# Patient Record
Sex: Female | Born: 1945 | Race: White | Hispanic: No | Marital: Married | Smoking: Former smoker
Health system: Southern US, Community
[De-identification: ages and names within clinical notes are randomized; demographics above are authoritative.]

## PROBLEM LIST (undated history)

## (undated) DIAGNOSIS — M810 Age-related osteoporosis without current pathological fracture: Secondary | ICD-10-CM

## (undated) DIAGNOSIS — K219 Gastro-esophageal reflux disease without esophagitis: Secondary | ICD-10-CM

## (undated) DIAGNOSIS — E785 Hyperlipidemia, unspecified: Secondary | ICD-10-CM

## (undated) DIAGNOSIS — T7840XA Allergy, unspecified, initial encounter: Secondary | ICD-10-CM

## (undated) DIAGNOSIS — M1990 Unspecified osteoarthritis, unspecified site: Secondary | ICD-10-CM

## (undated) DIAGNOSIS — M549 Dorsalgia, unspecified: Secondary | ICD-10-CM

## (undated) DIAGNOSIS — E039 Hypothyroidism, unspecified: Secondary | ICD-10-CM

## (undated) DIAGNOSIS — Z9089 Acquired absence of other organs: Secondary | ICD-10-CM

## (undated) DIAGNOSIS — J329 Chronic sinusitis, unspecified: Secondary | ICD-10-CM

## (undated) HISTORY — PX: ADENOIDECTOMY: SUR15

## (undated) HISTORY — DX: Dorsalgia, unspecified: M54.9

## (undated) HISTORY — PX: TONSILLECTOMY: SUR1361

## (undated) HISTORY — PX: DILATION AND CURETTAGE OF UTERUS: SHX78

## (undated) HISTORY — PX: PARATHYROIDECTOMY: SHX19

## (undated) HISTORY — DX: Allergy, unspecified, initial encounter: T78.40XA

## (undated) HISTORY — PX: LEFT OOPHORECTOMY: SHX1961

## (undated) HISTORY — PX: SHOULDER SURGERY: SHX246

## (undated) HISTORY — DX: Age-related osteoporosis without current pathological fracture: M81.0

## (undated) HISTORY — PX: POLYPECTOMY: SHX149

## (undated) HISTORY — DX: Chronic sinusitis, unspecified: J32.9

## (undated) HISTORY — DX: Gastro-esophageal reflux disease without esophagitis: K21.9

## (undated) HISTORY — DX: Acquired absence of other organs: Z90.89

## (undated) HISTORY — DX: Hypothyroidism, unspecified: E03.9

## (undated) HISTORY — DX: Hyperlipidemia, unspecified: E78.5

## (undated) HISTORY — DX: Unspecified osteoarthritis, unspecified site: M19.90

---

## 1998-10-29 ENCOUNTER — Other Ambulatory Visit: Admission: RE | Admit: 1998-10-29 | Discharge: 1998-10-29 | Payer: Self-pay | Admitting: Gastroenterology

## 1998-12-04 ENCOUNTER — Encounter: Payer: Self-pay | Admitting: Internal Medicine

## 1998-12-04 ENCOUNTER — Inpatient Hospital Stay (HOSPITAL_COMMUNITY): Admission: EM | Admit: 1998-12-04 | Discharge: 1998-12-05 | Payer: Self-pay | Admitting: Internal Medicine

## 1998-12-15 ENCOUNTER — Other Ambulatory Visit: Admission: RE | Admit: 1998-12-15 | Discharge: 1998-12-15 | Payer: Self-pay | Admitting: Gynecology

## 1999-01-10 ENCOUNTER — Encounter: Payer: Self-pay | Admitting: Internal Medicine

## 1999-01-10 ENCOUNTER — Ambulatory Visit (HOSPITAL_COMMUNITY): Admission: RE | Admit: 1999-01-10 | Discharge: 1999-01-10 | Payer: Self-pay | Admitting: Internal Medicine

## 1999-04-23 ENCOUNTER — Emergency Department (HOSPITAL_COMMUNITY): Admission: EM | Admit: 1999-04-23 | Discharge: 1999-04-24 | Payer: Self-pay | Admitting: Emergency Medicine

## 1999-04-24 ENCOUNTER — Encounter: Payer: Self-pay | Admitting: Emergency Medicine

## 1999-05-18 ENCOUNTER — Encounter: Payer: Self-pay | Admitting: Urology

## 1999-05-18 ENCOUNTER — Encounter: Admission: RE | Admit: 1999-05-18 | Discharge: 1999-05-18 | Payer: Self-pay | Admitting: Urology

## 1999-10-23 ENCOUNTER — Other Ambulatory Visit: Admission: RE | Admit: 1999-10-23 | Discharge: 1999-10-23 | Payer: Self-pay | Admitting: Gastroenterology

## 1999-12-17 ENCOUNTER — Other Ambulatory Visit: Admission: RE | Admit: 1999-12-17 | Discharge: 1999-12-17 | Payer: Self-pay | Admitting: Gynecology

## 2000-05-03 HISTORY — PX: ANTERIOR CERVICAL DECOMP/DISCECTOMY FUSION: SHX1161

## 2000-12-19 ENCOUNTER — Other Ambulatory Visit: Admission: RE | Admit: 2000-12-19 | Discharge: 2000-12-19 | Payer: Self-pay | Admitting: Gynecology

## 2001-01-09 ENCOUNTER — Ambulatory Visit (HOSPITAL_COMMUNITY): Admission: RE | Admit: 2001-01-09 | Discharge: 2001-01-09 | Payer: Self-pay | Admitting: Neurosurgery

## 2001-01-30 ENCOUNTER — Encounter: Payer: Self-pay | Admitting: Neurosurgery

## 2001-01-30 ENCOUNTER — Ambulatory Visit (HOSPITAL_COMMUNITY): Admission: RE | Admit: 2001-01-30 | Discharge: 2001-01-31 | Payer: Self-pay | Admitting: Neurosurgery

## 2001-03-21 ENCOUNTER — Encounter: Payer: Self-pay | Admitting: Otolaryngology

## 2001-03-21 ENCOUNTER — Encounter: Admission: RE | Admit: 2001-03-21 | Discharge: 2001-03-21 | Payer: Self-pay | Admitting: Otolaryngology

## 2001-03-27 ENCOUNTER — Ambulatory Visit (HOSPITAL_COMMUNITY): Admission: RE | Admit: 2001-03-27 | Discharge: 2001-03-27 | Payer: Self-pay | Admitting: Neurosurgery

## 2001-03-27 ENCOUNTER — Encounter: Payer: Self-pay | Admitting: Neurosurgery

## 2001-12-28 ENCOUNTER — Other Ambulatory Visit: Admission: RE | Admit: 2001-12-28 | Discharge: 2001-12-28 | Payer: Self-pay | Admitting: Obstetrics and Gynecology

## 2003-01-28 ENCOUNTER — Other Ambulatory Visit: Admission: RE | Admit: 2003-01-28 | Discharge: 2003-01-28 | Payer: Self-pay | Admitting: Gynecology

## 2004-03-25 ENCOUNTER — Ambulatory Visit: Payer: Self-pay | Admitting: Internal Medicine

## 2004-03-31 ENCOUNTER — Ambulatory Visit: Payer: Self-pay | Admitting: Internal Medicine

## 2012-04-25 ENCOUNTER — Emergency Department: Payer: Self-pay | Admitting: Emergency Medicine

## 2012-04-25 LAB — COMPREHENSIVE METABOLIC PANEL
Albumin: 4.3 g/dL (ref 3.4–5.0)
Alkaline Phosphatase: 80 U/L (ref 50–136)
Anion Gap: 6 — ABNORMAL LOW (ref 7–16)
BUN: 11 mg/dL (ref 7–18)
Bilirubin,Total: 1.1 mg/dL — ABNORMAL HIGH (ref 0.2–1.0)
Calcium, Total: 10.3 mg/dL — ABNORMAL HIGH (ref 8.5–10.1)
Chloride: 111 mmol/L — ABNORMAL HIGH (ref 98–107)
Co2: 26 mmol/L (ref 21–32)
Creatinine: 0.89 mg/dL (ref 0.60–1.30)
EGFR (African American): >60
EGFR (Non-African Amer.): >60
Glucose: 112 mg/dL — ABNORMAL HIGH (ref 65–99)
Osmolality: 285 (ref 275–301)
Potassium: 3.9 mmol/L (ref 3.5–5.1)
SGOT(AST): 25 U/L (ref 15–37)
SGPT (ALT): 30 U/L (ref 12–78)
Sodium: 143 mmol/L (ref 136–145)
Total Protein: 7.8 g/dL (ref 6.4–8.2)

## 2012-04-25 LAB — URINALYSIS, COMPLETE
Bacteria: NONE SEEN
Bilirubin,UR: NEGATIVE
Glucose,UR: NEGATIVE mg/dL (ref 0–75)
Ketone: NEGATIVE
Leukocyte Esterase: NEGATIVE
Nitrite: NEGATIVE
Ph: 6 (ref 4.5–8.0)
Protein: NEGATIVE
RBC,UR: 21 /HPF (ref 0–5)
Specific Gravity: 1.02 (ref 1.003–1.030)
Squamous Epithelial: 1
WBC UR: <1 /HPF (ref 0–5)

## 2012-04-25 LAB — CBC
HCT: 45.8 % (ref 35.0–47.0)
HGB: 15.5 g/dL (ref 12.0–16.0)
MCH: 31.2 pg (ref 26.0–34.0)
MCHC: 33.9 g/dL (ref 32.0–36.0)
MCV: 92 fL (ref 80–100)
Platelet: 248 10*3/uL (ref 150–440)
RBC: 4.97 10*6/uL (ref 3.80–5.20)
RDW: 13.3 % (ref 11.5–14.5)
WBC: 10 10*3/uL (ref 3.6–11.0)

## 2012-04-25 LAB — LIPASE, BLOOD: Lipase: 100 U/L (ref 73–393)

## 2014-07-31 LAB — HM DEXA SCAN

## 2015-05-07 DIAGNOSIS — H6980 Other specified disorders of Eustachian tube, unspecified ear: Secondary | ICD-10-CM | POA: Diagnosis not present

## 2015-05-07 DIAGNOSIS — J302 Other seasonal allergic rhinitis: Secondary | ICD-10-CM | POA: Diagnosis not present

## 2015-05-07 DIAGNOSIS — H9313 Tinnitus, bilateral: Secondary | ICD-10-CM | POA: Diagnosis not present

## 2015-05-12 DIAGNOSIS — I10 Essential (primary) hypertension: Secondary | ICD-10-CM | POA: Diagnosis not present

## 2015-05-12 DIAGNOSIS — E559 Vitamin D deficiency, unspecified: Secondary | ICD-10-CM | POA: Diagnosis not present

## 2015-05-12 DIAGNOSIS — E890 Postprocedural hypothyroidism: Secondary | ICD-10-CM | POA: Diagnosis not present

## 2015-05-12 DIAGNOSIS — E210 Primary hyperparathyroidism: Secondary | ICD-10-CM | POA: Diagnosis not present

## 2015-06-04 DIAGNOSIS — H9313 Tinnitus, bilateral: Secondary | ICD-10-CM | POA: Diagnosis not present

## 2015-06-04 DIAGNOSIS — H6980 Other specified disorders of Eustachian tube, unspecified ear: Secondary | ICD-10-CM | POA: Diagnosis not present

## 2015-06-04 DIAGNOSIS — J302 Other seasonal allergic rhinitis: Secondary | ICD-10-CM | POA: Diagnosis not present

## 2015-07-09 DIAGNOSIS — E039 Hypothyroidism, unspecified: Secondary | ICD-10-CM | POA: Diagnosis not present

## 2015-07-09 DIAGNOSIS — I959 Hypotension, unspecified: Secondary | ICD-10-CM | POA: Diagnosis not present

## 2015-07-09 DIAGNOSIS — E785 Hyperlipidemia, unspecified: Secondary | ICD-10-CM | POA: Diagnosis not present

## 2015-07-16 DIAGNOSIS — E039 Hypothyroidism, unspecified: Secondary | ICD-10-CM | POA: Diagnosis not present

## 2015-08-04 DIAGNOSIS — J342 Deviated nasal septum: Secondary | ICD-10-CM | POA: Diagnosis not present

## 2015-08-04 DIAGNOSIS — H6983 Other specified disorders of Eustachian tube, bilateral: Secondary | ICD-10-CM | POA: Diagnosis not present

## 2015-08-04 DIAGNOSIS — J3489 Other specified disorders of nose and nasal sinuses: Secondary | ICD-10-CM | POA: Diagnosis not present

## 2015-09-15 DIAGNOSIS — H6983 Other specified disorders of Eustachian tube, bilateral: Secondary | ICD-10-CM | POA: Diagnosis not present

## 2015-10-03 DIAGNOSIS — H6983 Other specified disorders of Eustachian tube, bilateral: Secondary | ICD-10-CM | POA: Diagnosis not present

## 2015-11-10 DIAGNOSIS — E559 Vitamin D deficiency, unspecified: Secondary | ICD-10-CM | POA: Diagnosis not present

## 2015-11-10 DIAGNOSIS — E892 Postprocedural hypoparathyroidism: Secondary | ICD-10-CM | POA: Diagnosis not present

## 2015-12-15 DIAGNOSIS — H6980 Other specified disorders of Eustachian tube, unspecified ear: Secondary | ICD-10-CM | POA: Diagnosis not present

## 2015-12-15 DIAGNOSIS — H9313 Tinnitus, bilateral: Secondary | ICD-10-CM | POA: Diagnosis not present

## 2015-12-15 DIAGNOSIS — J302 Other seasonal allergic rhinitis: Secondary | ICD-10-CM | POA: Diagnosis not present

## 2016-01-14 DIAGNOSIS — E039 Hypothyroidism, unspecified: Secondary | ICD-10-CM | POA: Diagnosis not present

## 2016-01-21 DIAGNOSIS — E039 Hypothyroidism, unspecified: Secondary | ICD-10-CM | POA: Diagnosis not present

## 2016-01-21 DIAGNOSIS — E782 Mixed hyperlipidemia: Secondary | ICD-10-CM | POA: Diagnosis not present

## 2016-01-21 DIAGNOSIS — Z23 Encounter for immunization: Secondary | ICD-10-CM | POA: Diagnosis not present

## 2016-01-21 DIAGNOSIS — E785 Hyperlipidemia, unspecified: Secondary | ICD-10-CM | POA: Diagnosis not present

## 2016-01-21 DIAGNOSIS — R252 Cramp and spasm: Secondary | ICD-10-CM | POA: Diagnosis not present

## 2016-03-31 DIAGNOSIS — R3129 Other microscopic hematuria: Secondary | ICD-10-CM | POA: Diagnosis not present

## 2016-03-31 DIAGNOSIS — N8110 Cystocele, unspecified: Secondary | ICD-10-CM | POA: Diagnosis not present

## 2016-03-31 DIAGNOSIS — Z01419 Encounter for gynecological examination (general) (routine) without abnormal findings: Secondary | ICD-10-CM | POA: Diagnosis not present

## 2016-03-31 DIAGNOSIS — N951 Menopausal and female climacteric states: Secondary | ICD-10-CM | POA: Diagnosis not present

## 2016-03-31 DIAGNOSIS — Z124 Encounter for screening for malignant neoplasm of cervix: Secondary | ICD-10-CM | POA: Diagnosis not present

## 2016-04-13 DIAGNOSIS — Z1231 Encounter for screening mammogram for malignant neoplasm of breast: Secondary | ICD-10-CM | POA: Diagnosis not present

## 2016-07-14 DIAGNOSIS — E039 Hypothyroidism, unspecified: Secondary | ICD-10-CM | POA: Diagnosis not present

## 2016-07-14 DIAGNOSIS — E559 Vitamin D deficiency, unspecified: Secondary | ICD-10-CM | POA: Diagnosis not present

## 2016-07-14 DIAGNOSIS — E782 Mixed hyperlipidemia: Secondary | ICD-10-CM | POA: Diagnosis not present

## 2016-07-14 LAB — LIPID PANEL
CHOLESTEROL: 151 (ref 0–200)
Cholesterol: 151 (ref 0–200)
HDL: 51 (ref 35–70)
HDL: 51 (ref 35–70)
LDL CALC: 74
LDL Cholesterol: 74
LDL/HDL RATIO: 1.5
TRIGLYCERIDES: 130 (ref 40–160)
Triglycerides: 130 (ref 40–160)

## 2016-07-14 LAB — HEPATIC FUNCTION PANEL
ALT: 28 (ref 7–35)
ALT: 28 (ref 7–35)
AST: 20 (ref 13–35)
AST: 20 (ref 13–35)
Alkaline Phosphatase: 55 (ref 25–125)

## 2016-07-14 LAB — BASIC METABOLIC PANEL
Creatinine: 1 (ref <=1.1)
GLUCOSE: 101
POTASSIUM: 4.2 (ref 3.4–5.3)
POTASSIUM: 4.2 (ref 3.4–5.3)

## 2016-07-14 LAB — CBC AND DIFFERENTIAL
HCT: 43 (ref 36–46)
HEMOGLOBIN: 14.8 (ref 12.0–16.0)
Platelets: 253 (ref 150–399)
WBC: 5.4

## 2016-07-14 LAB — VITAMIN D 25 HYDROXY (VIT D DEFICIENCY, FRACTURES): Vit D, 25-Hydroxy: 34.7

## 2016-07-14 LAB — TSH: TSH: 1.52 (ref <=5.90)

## 2016-07-21 DIAGNOSIS — J019 Acute sinusitis, unspecified: Secondary | ICD-10-CM | POA: Diagnosis not present

## 2016-07-21 DIAGNOSIS — E039 Hypothyroidism, unspecified: Secondary | ICD-10-CM | POA: Diagnosis not present

## 2016-07-21 DIAGNOSIS — E782 Mixed hyperlipidemia: Secondary | ICD-10-CM | POA: Diagnosis not present

## 2016-08-18 DIAGNOSIS — H9209 Otalgia, unspecified ear: Secondary | ICD-10-CM | POA: Diagnosis not present

## 2016-08-18 DIAGNOSIS — J019 Acute sinusitis, unspecified: Secondary | ICD-10-CM | POA: Diagnosis not present

## 2016-08-18 DIAGNOSIS — R51 Headache: Secondary | ICD-10-CM | POA: Diagnosis not present

## 2016-09-23 DIAGNOSIS — J029 Acute pharyngitis, unspecified: Secondary | ICD-10-CM | POA: Diagnosis not present

## 2016-12-15 ENCOUNTER — Encounter: Payer: Self-pay | Admitting: Family Medicine

## 2016-12-15 ENCOUNTER — Ambulatory Visit (INDEPENDENT_AMBULATORY_CARE_PROVIDER_SITE_OTHER): Payer: Medicare Other | Admitting: Family Medicine

## 2016-12-15 VITALS — BP 128/78 | HR 61 | Temp 98.1°F | Ht 66.0 in | Wt 184.8 lb

## 2016-12-15 DIAGNOSIS — E663 Overweight: Secondary | ICD-10-CM | POA: Diagnosis not present

## 2016-12-15 DIAGNOSIS — Z0000 Encounter for general adult medical examination without abnormal findings: Secondary | ICD-10-CM | POA: Diagnosis not present

## 2016-12-15 DIAGNOSIS — E039 Hypothyroidism, unspecified: Secondary | ICD-10-CM | POA: Diagnosis not present

## 2016-12-15 DIAGNOSIS — E782 Mixed hyperlipidemia: Secondary | ICD-10-CM

## 2016-12-15 DIAGNOSIS — E785 Hyperlipidemia, unspecified: Secondary | ICD-10-CM

## 2016-12-15 HISTORY — DX: Hypothyroidism, unspecified: E03.9

## 2016-12-15 HISTORY — DX: Hyperlipidemia, unspecified: E78.5

## 2016-12-15 LAB — LIPID PANEL
Cholesterol: 168 mg/dL (ref 0–200)
HDL: 49 mg/dL (ref >39.00)
LDL Cholesterol: 95 mg/dL (ref 0–99)
NonHDL: 119.29
Total CHOL/HDL Ratio: 3
Triglycerides: 121 mg/dL (ref 0.0–149.0)
VLDL: 24.2 mg/dL (ref 0.0–40.0)

## 2016-12-15 LAB — COMPREHENSIVE METABOLIC PANEL
ALT: 26 U/L (ref 0–35)
AST: 24 U/L (ref 0–37)
Albumin: 4.7 g/dL (ref 3.5–5.2)
Alkaline Phosphatase: 44 U/L (ref 39–117)
BUN: 9 mg/dL (ref 6–23)
CO2: 29 mEq/L (ref 19–32)
Calcium: 10.2 mg/dL (ref 8.4–10.5)
Chloride: 108 mEq/L (ref 96–112)
Creatinine, Ser: 1.02 mg/dL (ref 0.40–1.20)
GFR: 56.85 mL/min — ABNORMAL LOW (ref >60.00)
Glucose, Bld: 96 mg/dL (ref 70–99)
Potassium: 4.6 mEq/L (ref 3.5–5.1)
Sodium: 143 mEq/L (ref 135–145)
Total Bilirubin: 1.2 mg/dL (ref 0.2–1.2)
Total Protein: 6.6 g/dL (ref 6.0–8.3)

## 2016-12-15 LAB — CBC
HCT: 44.8 % (ref 36.0–46.0)
Hemoglobin: 15.1 g/dL — ABNORMAL HIGH (ref 12.0–15.0)
MCHC: 33.7 g/dL (ref 30.0–36.0)
MCV: 93 fl (ref 78.0–100.0)
Platelets: 273 10*3/uL (ref 150.0–400.0)
RBC: 4.82 Mil/uL (ref 3.87–5.11)
RDW: 13.9 % (ref 11.5–15.5)
WBC: 5.3 10*3/uL (ref 4.0–10.5)

## 2016-12-15 LAB — TSH: TSH: 1.09 u[IU]/mL (ref 0.35–4.50)

## 2016-12-15 LAB — T4, FREE: Free T4: 1.23 ng/dL (ref 0.60–1.60)

## 2016-12-15 NOTE — Progress Notes (Signed)
Latasha Mclaughlin is a 71 y.o. female is here to [place].   Patient Care Team: Latasha Deutscher, DO as PCP - General (Family Medicine)   History of Present Illness:   Latasha Mclaughlin, CMA, acting as scribe for Dr. Juleen Mclaughlin.  HPI:  Health Maintenance Due  Topic Date Due  . Hepatitis C Screening  11/06/1945  . TETANUS/TDAP  05/21/1965  . PNA vac Low Risk Adult (2 of 2 - PPSV23) 05/18/2016  . INFLUENZA VACCINE  12/01/2016   Depression screen PHQ 2/9 12/15/2016  Decreased Interest 0  Down, Depressed, Hopeless 0  PHQ - 2 Score 0   PMHx, SurgHx, SocialHx, Medications, and Allergies were reviewed in the Visit Navigator and updated as appropriate.   Past Medical History:  Diagnosis Date  . History of tonsillectomy   . Hyperlipidemia 12/15/2016  . Hypothyroidism 12/15/2016  . Osteoporosis    Past Surgical History:  Procedure Laterality Date  . ANTERIOR CERVICAL DECOMP/DISCECTOMY FUSION  2002  . LEFT OOPHORECTOMY    . PARATHYROIDECTOMY    . TONSILLECTOMY     Family History  Problem Relation Age of Onset  . Hyperlipidemia Mother   . Hypertension Mother   . Cancer Father   . Diabetes Father   . Heart disease Father   . Hyperlipidemia Father   . Thyroid disease Sister    Social History  Substance Use Topics  . Smoking status: Former Research scientist (life sciences)  . Smokeless tobacco: Never Used     Comment: Quite in 1985  . Alcohol use No   Current Medications and Allergies:   .  Cholecalciferol (VITAMIN D-1000 MAX ST) 1000 units tablet, Take 2,000 Units by mouth daily., Disp: , Rfl:  .  levothyroxine (SYNTHROID) 100 MCG tablet, Take 100 mcg by mouth every 3 (three) days., Disp: , Rfl:  .  levothyroxine (SYNTHROID) 88 MCG tablet, Take 88 mcg by mouth., Disp: , Rfl:  .  rosuvastatin (CRESTOR) 10 MG tablet, Take 10 mg by mouth every other day., Disp: , Rfl: (NOT CURRENTLY TAKING)  Allergies  Allergen Reactions  . Prevacid [Lansoprazole] Rash   Review of Systems:   Pertinent items are  noted in the HPI. Otherwise, ROS is negative.  Vitals:   Vitals:   12/15/16 1051  BP: 128/78  Pulse: 61  Temp: 98.1 F (36.7 C)  TempSrc: Oral  SpO2: 97%  Weight: 184 lb 12.8 oz (83.8 kg)  Height: 5\' 6"  (1.676 m)     Body mass index is 29.83 kg/m. Physical Exam:   Physical Exam  Constitutional: She appears well-nourished.  HENT:  Head: Normocephalic and atraumatic.  Eyes: Pupils are equal, round, and reactive to light. EOM are normal.  Neck: Normal range of motion. Neck supple.  Cardiovascular: Normal rate, regular rhythm, normal heart sounds and intact distal pulses.   Pulmonary/Chest: Effort normal.  Abdominal: Soft.  Skin: Skin is warm.  Psychiatric: She has a normal mood and affect. Her behavior is normal.  Nursing note and vitals reviewed.  Results for orders placed or performed in visit on 12/15/16  TSH  Result Value Ref Range   TSH 1.09 0.35 - 4.50 uIU/mL  T4, free  Result Value Ref Range   Free T4 1.23 0.60 - 1.60 ng/dL  CBC  Result Value Ref Range   WBC 5.3 4.0 - 10.5 K/uL   RBC 4.82 3.87 - 5.11 Mil/uL   Platelets 273.0 150.0 - 400.0 K/uL   Hemoglobin 15.1 (H) 12.0 - 15.0 g/dL   HCT  44.8 36.0 - 46.0 %   MCV 93.0 78.0 - 100.0 fl   MCHC 33.7 30.0 - 36.0 g/dL   RDW 13.9 11.5 - 15.5 %  Comprehensive metabolic panel  Result Value Ref Range   Sodium 143 135 - 145 mEq/L   Potassium 4.6 3.5 - 5.1 mEq/L   Chloride 108 96 - 112 mEq/L   CO2 29 19 - 32 mEq/L   Glucose, Bld 96 70 - 99 mg/dL   BUN 9 6 - 23 mg/dL   Creatinine, Ser 1.02 0.40 - 1.20 mg/dL   Total Bilirubin 1.2 0.2 - 1.2 mg/dL   Alkaline Phosphatase 44 39 - 117 U/L   AST 24 0 - 37 U/L   ALT 26 0 - 35 U/L   Total Protein 6.6 6.0 - 8.3 g/dL   Albumin 4.7 3.5 - 5.2 g/dL   Calcium 10.2 8.4 - 10.5 mg/dL   GFR 56.85 (L) >60.00 mL/min  Lipid panel  Result Value Ref Range   Cholesterol 168 0 - 200 mg/dL   Triglycerides 121.0 0.0 - 149.0 mg/dL   HDL 49.00 >39.00 mg/dL   VLDL 24.2 0.0 - 40.0 mg/dL     LDL Cholesterol 95 0 - 99 mg/dL   Total CHOL/HDL Ratio 3    NonHDL 119.29   VITAMIN D 25 Hydroxy (Vit-D Deficiency, Fractures)  Result Value Ref Range   Vit D, 25-Hydroxy 59.5   Basic metabolic panel  Result Value Ref Range   Glucose 101    Creatinine 1.0 0.5 - 1.1   Potassium 4.2 3.4 - 5.3  Lipid panel  Result Value Ref Range   LDl/HDL Ratio 1.5    Triglycerides 130 40 - 160   Cholesterol 151 0 - 200   HDL 51 35 - 70   LDL Cholesterol 74   Hepatic function panel  Result Value Ref Range   ALT 28 7 - 35   AST 20 13 - 35  TSH  Result Value Ref Range   TSH 1.52 0.41 - 5.90   Assessment and Plan:   Latasha Mclaughlin was seen today for establish care.  Diagnoses and all orders for this visit:  Routine health maintenance -     CBC -     Comprehensive metabolic panel  Acquired hypothyroidism Comments: Will allow patient to try 88 mcg daily x 6 weeks and recheck since she would like to simplify her regimen.  Orders: -     TSH -     T4, free  Mixed hyperlipidemia Comments: Controlled at this time.  Orders: -     Lipid panel  Overweight (BMI 25.0-29.9) Comments: The patient is asked to make an attempt to improve diet and exercise patterns to aid in medical management of this problem.    . Reviewed expectations re: course of current medical issues. . Discussed self-management of symptoms. . Outlined signs and symptoms indicating need for more acute intervention. . Patient verbalized understanding and all questions were answered. Marland Kitchen Health Maintenance issues including appropriate healthy diet, exercise, and smoking avoidance were discussed with patient. . See orders for this visit as documented in the electronic medical record. . Patient received an After Visit Summary.  CMA served as Education administrator during this visit. History, Physical, and Plan performed by medical provider. The above documentation has been reviewed and is accurate and complete. Latasha Mclaughlin, D.O.  Latasha Deutscher,  DO [place], [facility] 12/16/2016  Future Appointments Date Time Provider [place]  06/17/2017 11:00 AM Latasha Deutscher,  DO [facility] None

## 2016-12-16 ENCOUNTER — Encounter: Payer: Self-pay | Admitting: Family Medicine

## 2016-12-16 DIAGNOSIS — E663 Overweight: Secondary | ICD-10-CM | POA: Insufficient documentation

## 2016-12-21 ENCOUNTER — Telehealth: Payer: Self-pay | Admitting: Family Medicine

## 2016-12-21 NOTE — Telephone Encounter (Signed)
ROI faxed to [facility]  °

## 2016-12-24 NOTE — Telephone Encounter (Signed)
Rec'd records from [facility]

## 2016-12-26 MED ORDER — PRAVASTATIN SODIUM 20 MG PO TABS: 20.0000 mg | ORAL_TABLET | Freq: Every day | ORAL | 3 refills | Status: DC

## 2016-12-26 NOTE — Addendum Note (Signed)
Addended by: Briscoe Deutscher R on: 12/26/2016 07:37 PM   Modules accepted: Orders

## 2017-01-05 ENCOUNTER — Ambulatory Visit (INDEPENDENT_AMBULATORY_CARE_PROVIDER_SITE_OTHER): Payer: Medicare Other | Admitting: Family Medicine

## 2017-01-05 ENCOUNTER — Telehealth: Payer: Self-pay | Admitting: Family Medicine

## 2017-01-05 ENCOUNTER — Encounter: Payer: Self-pay | Admitting: Family Medicine

## 2017-01-05 DIAGNOSIS — M791 Myalgia, unspecified site: Secondary | ICD-10-CM | POA: Insufficient documentation

## 2017-01-05 DIAGNOSIS — R202 Paresthesia of skin: Secondary | ICD-10-CM | POA: Diagnosis not present

## 2017-01-05 DIAGNOSIS — Z8639 Personal history of other endocrine, nutritional and metabolic disease: Secondary | ICD-10-CM | POA: Diagnosis not present

## 2017-01-05 DIAGNOSIS — R5383 Other fatigue: Secondary | ICD-10-CM | POA: Diagnosis not present

## 2017-01-05 DIAGNOSIS — M2550 Pain in unspecified joint: Secondary | ICD-10-CM | POA: Diagnosis not present

## 2017-01-05 LAB — COMPREHENSIVE METABOLIC PANEL
AG Ratio: 1.8 (calc) (ref 1.0–2.5)
ALT: 18 U/L (ref 6–29)
AST: 17 U/L (ref 10–35)
Albumin: 4.4 g/dL (ref 3.6–5.1)
Alkaline phosphatase (APISO): 51 U/L (ref 33–130)
BUN/Creatinine Ratio: 11 (calc) (ref 6–22)
BUN: 11 mg/dL (ref 7–25)
CO2: 26 mmol/L (ref 20–32)
Calcium: 10.4 mg/dL (ref 8.6–10.4)
Chloride: 106 mmol/L (ref 98–110)
Creat: 0.96 mg/dL — ABNORMAL HIGH (ref 0.60–0.93)
Globulin: 2.5 g/dL (calc) (ref 1.9–3.7)
Glucose, Bld: 95 mg/dL (ref 65–99)
Potassium: 4.7 mmol/L (ref 3.5–5.3)
Sodium: 143 mmol/L (ref 135–146)
Total Bilirubin: 0.8 mg/dL (ref 0.2–1.2)
Total Protein: 6.9 g/dL (ref 6.1–8.1)

## 2017-01-05 LAB — CK: Total CK: 56 U/L (ref 29–143)

## 2017-01-05 NOTE — Telephone Encounter (Signed)
Patient called in with symptoms of nervousness/jittery, painful bones, and hands and feet "tingling". Patient did not know if this was a reaction to medication pravastatin (PRAVACHOL) 20 MG tablet. Sent patient to triage. Awaiting note from triage.

## 2017-01-05 NOTE — Telephone Encounter (Signed)
Patient coming in for appointment with [name] this afternoon.

## 2017-01-05 NOTE — Telephone Encounter (Signed)
Patient Name: Latasha Mclaughlin  DOB: 05-01-1946    Initial Comment Caller states, on a new rx. tingling in muscles, hands and feet, bone pain, para-thyroid issues. Please ask for name of rx.    Nurse Assessment  Nurse: [name], RN, Vanita Ingles Date/Time (Eastern Time): 01/05/2017 11:54:05 AM  Confirm and document reason for call. If symptomatic, describe symptoms. ---Caller states she is having pain in her bones. Tingling in her hands and feet. Symptoms started a week ago. Symptoms have worsened the past 3-4 days. History of parathyroidism. Taking pravastatin. BP and pulse are fine. Feels jittery. bone pain in the bottom of her back, knees and elbows.  Does the patient have any new or worsening symptoms? ---Yes  Will a triage be completed? ---Yes  Related visit to physician within the last 2 weeks? ---No  Does the PT have any chronic conditions? (i.e. diabetes, asthma, etc.) ---Yes  List chronic conditions. ---history of parathyroidism  Is this a behavioral health or substance abuse call? ---No     Guidelines    Guideline Title Affirmed Question Affirmed Notes  Neurologic Deficit Back pain (and neurologic deficit)    Final Disposition User   See Physician within 4 Hours (or PCP triage) [name], RN, [place]    Comments  appt scheduled for 01/05/2017 at 2:30 pm with Dr. Briscoe Deutscher   Referrals  REFERRED TO PCP OFFICE   Disagree/Comply: Comply

## 2017-01-05 NOTE — Progress Notes (Signed)
Latasha Mclaughlin is a 71 y.o. female here for an acute visit.  History of Present Illness:   Water quality scientist, CMA, acting as scribe for Dr. [name].  HPI: Patient with history of HPTH presents with several days of myalgias and arthralgias. Latasha Mclaughlin also changed her Levothyroxine dose at the last visit (see previous note) and started Pravachol (see previous note). Since the pain started, Latasha Mclaughlin went back to her previous dose of Levothyroxine and stopped the Pravachol. No headaches, dizziness, vision changes, CP, SOB, URI s/s, cough, abdominal pain, bowel or bladder changes, falls, edema, or other new exposures. Latasha Mclaughlin says that this feels the same as when Latasha Mclaughlin had a HPTH issue and needed intervention.  PMHx, SurgHx, SocialHx, Medications, and Allergies were reviewed in the Visit Navigator and updated as appropriate.  Current Medications:   .  Cholecalciferol (VITAMIN D-1000 MAX ST) 1000 units tablet, Take 2,000 Units by mouth daily., Disp: , Rfl:  .  levothyroxine (SYNTHROID) 100 MCG tablet, Take 100 mcg by mouth every 3 (three) days., Disp: , Rfl:  .  levothyroxine (SYNTHROID) 88 MCG tablet, Take 88 mcg by mouth., Disp: , Rfl:  .  pravastatin (PRAVACHOL) 20 MG tablet, Take 1 tablet (20 mg total) by mouth daily., Disp: 90 tablet, Rfl: 3   Allergies  Allergen Reactions  . Prevacid [Lansoprazole] Rash   Review of Systems:   Pertinent items are noted in the HPI. Otherwise, ROS is negative.  Vitals:   Vitals:   01/05/17 1432  BP: 126/78  Pulse: 76  Temp: 98 F (36.7 C)  TempSrc: Oral  SpO2: 97%  Weight: 183 lb 12.8 oz (83.4 kg)  Height: 5\' 6"  (1.676 m)     Body mass index is 29.67 kg/m.   Physical Exam:   Physical Exam  Constitutional: Latasha Mclaughlin appears well-nourished.  HENT:  Head: Normocephalic and atraumatic.  Eyes: Pupils are equal, round, and reactive to light. Conjunctivae and EOM are normal.  Neck: Normal range of motion. Neck supple. No thyromegaly present.  Cardiovascular: Normal  rate, regular rhythm, normal heart sounds and intact distal pulses.   Pulmonary/Chest: Effort normal and breath sounds normal.  Abdominal: Soft. Bowel sounds are normal.  Musculoskeletal: Normal range of motion.  Neurological: Latasha Mclaughlin is alert.  Skin: Skin is warm. Capillary refill takes less than 2 seconds.  Psychiatric: Latasha Mclaughlin has a normal mood and affect. Her behavior is normal.  Nursing note and vitals reviewed.   Assessment and Plan:   Latasha Mclaughlin was seen today for acute visit.  Diagnoses and all orders for this visit:  Myalgia -     CBC with Differential/Platelet -     Cancel: Comprehensive metabolic panel -     Cancel: PTH, Intact and Calcium -     TSH -     Vitamin B12 -     Cancel: CK -     Sedimentation rate -     C-reactive protein -     PTH, Intact and Calcium -     CK -     Comprehensive metabolic panel  Arthralgia, unspecified joint -     CBC with Differential/Platelet -     Cancel: Comprehensive metabolic panel -     Cancel: PTH, Intact and Calcium -     TSH -     Vitamin B12 -     Cancel: CK -     Sedimentation rate -     C-reactive protein  Fatigue, unspecified type -     Vitamin  B12 -     PTH, Intact and Calcium -     CK -     Comprehensive metabolic panel  Paresthesia -     Vitamin B12 -     PTH, Intact and Calcium -     CK -     Comprehensive metabolic panel   . Reviewed expectations re: course of current medical issues. . Discussed self-management of symptoms. . Outlined signs and symptoms indicating need for more acute intervention. . Patient verbalized understanding and all questions were answered. Marland Kitchen Health Maintenance issues including appropriate healthy diet, exercise, and smoking avoidance were discussed with patient. . See orders for this visit as documented in the electronic medical record. . Patient received an After Visit Summary.  CMA served as Education administrator during this visit. History, Physical, and Plan performed by medical provider. The above  documentation has been reviewed and is accurate and complete. Briscoe Deutscher, D.O.  Briscoe Deutscher, DO [place], [facility] 01/08/2017  Future Appointments Date Time Provider [place]  06/17/2017 11:00 AM Briscoe Deutscher, DO [facility] None

## 2017-01-05 NOTE — Telephone Encounter (Signed)
Noted  

## 2017-01-06 LAB — TSH: TSH: 1.47 u[IU]/mL (ref 0.35–4.50)

## 2017-01-06 LAB — CBC WITH DIFFERENTIAL/PLATELET
Basophils Absolute: 0.1 10*3/uL (ref 0.0–0.1)
Basophils Relative: 1.3 % (ref 0.0–3.0)
Eosinophils Absolute: 0.1 10*3/uL (ref 0.0–0.7)
Eosinophils Relative: 1.1 % (ref 0.0–5.0)
HCT: 46 % (ref 36.0–46.0)
Hemoglobin: 15.3 g/dL — ABNORMAL HIGH (ref 12.0–15.0)
Lymphocytes Relative: 34.6 % (ref 12.0–46.0)
Lymphs Abs: 2.1 10*3/uL (ref 0.7–4.0)
MCHC: 33.2 g/dL (ref 30.0–36.0)
MCV: 93.5 fl (ref 78.0–100.0)
Monocytes Absolute: 0.5 10*3/uL (ref 0.1–1.0)
Monocytes Relative: 8.6 % (ref 3.0–12.0)
Neutro Abs: 3.3 10*3/uL (ref 1.4–7.7)
Neutrophils Relative %: 54.4 % (ref 43.0–77.0)
Platelets: 286 10*3/uL (ref 150.0–400.0)
RBC: 4.93 Mil/uL (ref 3.87–5.11)
RDW: 13.9 % (ref 11.5–15.5)
WBC: 6.1 10*3/uL (ref 4.0–10.5)

## 2017-01-06 LAB — VITAMIN B12: Vitamin B-12: 274 pg/mL (ref 211–911)

## 2017-01-06 LAB — SEDIMENTATION RATE: Sed Rate: 5 mm/hr (ref 0–30)

## 2017-01-06 LAB — PTH, INTACT AND CALCIUM
Calcium: 10.4 mg/dL (ref 8.6–10.4)
PTH: 44 pg/mL (ref 14–64)

## 2017-01-07 LAB — C-REACTIVE PROTEIN: CRP: <0.1 mg/dL — ABNORMAL LOW (ref 0.5–20.0)

## 2017-01-08 DIAGNOSIS — Z8639 Personal history of other endocrine, nutritional and metabolic disease: Secondary | ICD-10-CM | POA: Insufficient documentation

## 2017-01-17 ENCOUNTER — Encounter: Payer: Self-pay | Admitting: Family Medicine

## 2017-01-17 ENCOUNTER — Ambulatory Visit (INDEPENDENT_AMBULATORY_CARE_PROVIDER_SITE_OTHER): Payer: Medicare Other

## 2017-01-17 ENCOUNTER — Ambulatory Visit (INDEPENDENT_AMBULATORY_CARE_PROVIDER_SITE_OTHER): Payer: Medicare Other | Admitting: Family Medicine

## 2017-01-17 ENCOUNTER — Telehealth: Payer: Self-pay | Admitting: Family Medicine

## 2017-01-17 VITALS — BP 142/82 | HR 72 | Temp 98.2°F | Wt 185.4 lb

## 2017-01-17 DIAGNOSIS — R1032 Left lower quadrant pain: Secondary | ICD-10-CM | POA: Diagnosis not present

## 2017-01-17 LAB — COMPREHENSIVE METABOLIC PANEL
ALT: 20 U/L (ref 0–35)
AST: 18 U/L (ref 0–37)
Albumin: 4.4 g/dL (ref 3.5–5.2)
Alkaline Phosphatase: 49 U/L (ref 39–117)
BILIRUBIN TOTAL: 0.9 mg/dL (ref 0.2–1.2)
BUN: 9 mg/dL (ref 6–23)
CO2: 26 meq/L (ref 19–32)
Calcium: 10.1 mg/dL (ref 8.4–10.5)
Chloride: 106 mEq/L (ref 96–112)
Creatinine, Ser: 1.04 mg/dL (ref 0.40–1.20)
GFR: 55.57 mL/min — AB (ref >60.00)
GLUCOSE: 93 mg/dL (ref 70–99)
Potassium: 4.4 mEq/L (ref 3.5–5.1)
SODIUM: 140 meq/L (ref 135–145)
TOTAL PROTEIN: 6.8 g/dL (ref 6.0–8.3)

## 2017-01-17 LAB — POCT URINALYSIS DIPSTICK
BILIRUBIN UA: NEGATIVE
Glucose, UA: NEGATIVE
KETONES UA: NEGATIVE
Leukocytes, UA: NEGATIVE
Nitrite, UA: NEGATIVE
PH UA: 6.5 (ref 5.0–8.0)
PROTEIN UA: NEGATIVE
RBC UA: NEGATIVE
Urobilinogen, UA: 0.2 E.U./dL

## 2017-01-17 LAB — CBC WITH DIFFERENTIAL/PLATELET
BASOS ABS: 0.1 10*3/uL (ref 0.0–0.1)
Basophils Relative: 0.9 % (ref 0.0–3.0)
EOS ABS: 0.1 10*3/uL (ref 0.0–0.7)
Eosinophils Relative: 1 % (ref 0.0–5.0)
HCT: 45.4 % (ref 36.0–46.0)
Hemoglobin: 15.1 g/dL — ABNORMAL HIGH (ref 12.0–15.0)
LYMPHS ABS: 2.3 10*3/uL (ref 0.7–4.0)
Lymphocytes Relative: 37.2 % (ref 12.0–46.0)
MCHC: 33.2 g/dL (ref 30.0–36.0)
MCV: 93.2 fl (ref 78.0–100.0)
MONO ABS: 0.6 10*3/uL (ref 0.1–1.0)
Monocytes Relative: 9 % (ref 3.0–12.0)
NEUTROS ABS: 3.2 10*3/uL (ref 1.4–7.7)
NEUTROS PCT: 51.9 % (ref 43.0–77.0)
PLATELETS: 284 10*3/uL (ref 150.0–400.0)
RBC: 4.86 Mil/uL (ref 3.87–5.11)
RDW: 14 % (ref 11.5–15.5)
WBC: 6.2 10*3/uL (ref 4.0–10.5)

## 2017-01-17 LAB — URINALYSIS, MICROSCOPIC ONLY
RBC / HPF: NONE SEEN (ref 0–?)
WBC, UA: NONE SEEN (ref 0–?)

## 2017-01-17 IMAGING — DX DG ABDOMEN 1V
1 series · 1 of 1 positions shown · non-contrast
Comparison: None.

CLINICAL DATA: Acute left lower quadrant abdominal pain.

EXAM:
ABDOMEN - 1 VIEW

[kub ap]
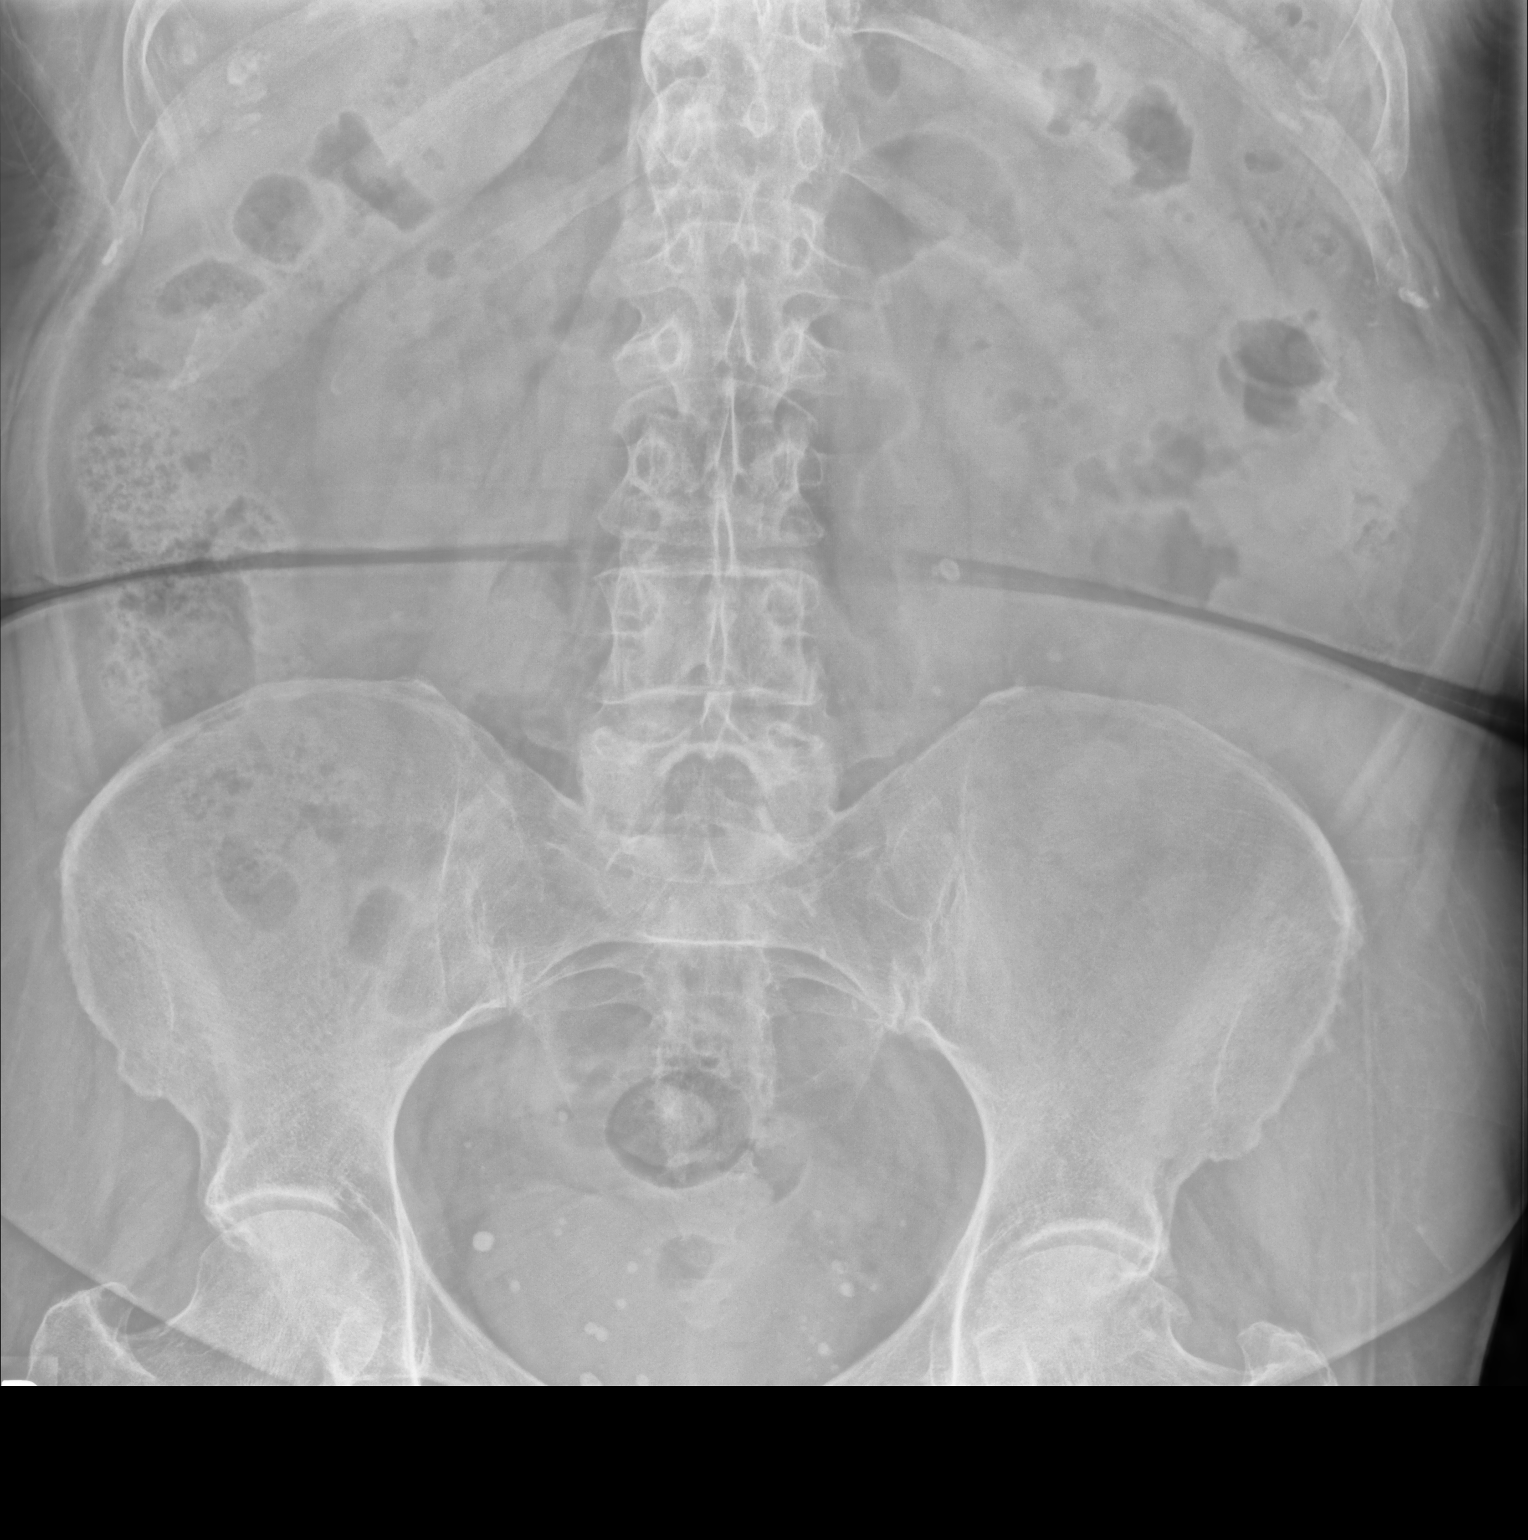

[1 of 1 positions shown; findings below may reference images not displayed]

FINDINGS: The bowel gas pattern is normal. Phleboliths are noted in the
pelvis.
IMPRESSION: No evidence of bowel obstruction or ileus.

## 2017-01-17 NOTE — Progress Notes (Signed)
Subjective:    Patient ID: Latasha Mclaughlin, female    DOB: Nov 18, 1945, 71 y.o.   MRN: [id number]  HPI This is a 71 yo female with left lower abdominal pain x 4 days. Pain started left side of abdomen with first morning void, then feels sore. Pain has been worse today- pain in side with urination. Does not think she is having dysuria. Pressure relieved with bowel movement, last BM today. No diarrhea, no constipation. No fever. No nausea or vomiting. Pain worse in morning with first void. Feels sore after urinating. Had some hydrocodone for a couple of days, none in last couple of days. Has been drinking cranberry juice. Urine not dark. Does not feel poorly. Sleeping well. Last colonoscopy 3 years ago, had a polyp removed. Was told to follow up in 3 years, missed spring follow up due to moving.  Has a history of kidney stones x 1, 15 years ago. Pain similar, not exactly same. Prior episode, pain was constant. This comes and goes.   Past Medical History:  Diagnosis Date  . Arthritis   . Back pain   . GERD (gastroesophageal reflux disease)   . History of tonsillectomy   . Hyperlipidemia 12/15/2016  . Hypothyroidism 12/15/2016  . Osteoporosis    Past Surgical History:  Procedure Laterality Date  . ADENOIDECTOMY    . ANTERIOR CERVICAL DECOMP/DISCECTOMY FUSION  2002  . DILATION AND CURETTAGE OF UTERUS     X2  . LEFT OOPHORECTOMY    . PARATHYROIDECTOMY    . SHOULDER SURGERY    . TONSILLECTOMY    . VAGINAL HYSTERECTOMY     Family History  Problem Relation Age of Onset  . Hyperlipidemia Mother   . Hypertension Mother   . Congestive Heart Failure Mother   . Cancer Father   . Diabetes Father   . Heart disease Father   . Hyperlipidemia Father   . Thyroid disease Sister   . Drug abuse Brother   . Diabetes Son    Social History  Substance Use Topics  . Smoking status: Former Research scientist (life sciences)  . Smokeless tobacco: Never Used     Comment: Quite in 1985  . Alcohol use No      Review of  Systems Per HPI    Objective:   Physical Exam  Constitutional: She is oriented to person, place, and time. She appears well-developed and well-nourished. No distress.  HENT:  Head: Normocephalic and atraumatic.  Eyes: Conjunctivae are normal.  Cardiovascular: Normal rate, regular rhythm and normal heart sounds.   Pulmonary/Chest: Effort normal and breath sounds normal.  Abdominal: Soft. Bowel sounds are normal. She exhibits no distension and no mass. There is tenderness. There is no rebound and no guarding.  Neurological: She is alert and oriented to person, place, and time.  Skin: Skin is warm and dry. She is not diaphoretic.  Psychiatric: She has a normal mood and affect. Her behavior is normal. Judgment and thought content normal.  Vitals reviewed.    BP (!) 142/82 (BP Location: Right Arm, Patient Position: Sitting, Cuff Size: Normal)   Pulse 72   Temp 98.2 F (36.8 C) (Oral)   Wt 185 lb 6.4 oz (84.1 kg)   SpO2 96%   BMI 29.92 kg/m  Wt Readings from Last 3 Encounters:  01/17/17 185 lb 6.4 oz (84.1 kg)  01/05/17 183 lb 12.8 oz (83.4 kg)  12/15/16 184 lb 12.8 oz (83.8 kg)        Assessment & Plan:  Discussed with Dr. [name] who agreed with plan 1. Left lower quadrant pain - unsure of cause- ? Stone vs diverticulitis, will check labs and xray, non toxic appearance - will notify her of results and plan when labs/xray available - POCT urinalysis dipstick - POCT urinalysis dipstick - DG Abd 1 View; Future - CBC with Differential/Platelet - Comprehensive metabolic panel - Urine Microscopic   Clarene Reamer, FNP-BC  [facility] [place] [place] [facility]  01/17/2017 1:37 PM

## 2017-01-17 NOTE — Telephone Encounter (Signed)
Patient is being seen at 1pm today with Latasha Mclaughlin for pains in lower back, difficulty urinating

## 2017-01-17 NOTE — Patient Instructions (Signed)
Please continue to drink cranberry juice- 1-2 glasses a day For pain, can try tylenol or ibuprofen  I will notify you of labs/xray results

## 2017-01-17 NOTE — Telephone Encounter (Signed)
Noted  

## 2017-01-19 ENCOUNTER — Ambulatory Visit (HOSPITAL_COMMUNITY)
Admission: RE | Admit: 2017-01-19 | Discharge: 2017-01-19 | Disposition: A | Discharge status: Home / Self Care | Payer: Medicare Other | Source: Ambulatory Visit | Attending: Family Medicine | Admitting: Family Medicine

## 2017-01-19 ENCOUNTER — Telehealth: Payer: Self-pay | Admitting: Family Medicine

## 2017-01-19 ENCOUNTER — Ambulatory Visit (INDEPENDENT_AMBULATORY_CARE_PROVIDER_SITE_OTHER): Payer: Medicare Other | Admitting: Family Medicine

## 2017-01-19 ENCOUNTER — Other Ambulatory Visit: Payer: Self-pay

## 2017-01-19 ENCOUNTER — Encounter: Payer: Self-pay | Admitting: Family Medicine

## 2017-01-19 VITALS — BP 122/80 | HR 66 | Temp 98.5°F | Ht 66.0 in | Wt 183.2 lb

## 2017-01-19 DIAGNOSIS — Z23 Encounter for immunization: Secondary | ICD-10-CM | POA: Diagnosis not present

## 2017-01-19 DIAGNOSIS — R1032 Left lower quadrant pain: Secondary | ICD-10-CM | POA: Diagnosis not present

## 2017-01-19 DIAGNOSIS — K760 Fatty (change of) liver, not elsewhere classified: Secondary | ICD-10-CM | POA: Diagnosis not present

## 2017-01-19 DIAGNOSIS — Z90710 Acquired absence of both cervix and uterus: Secondary | ICD-10-CM | POA: Diagnosis not present

## 2017-01-19 DIAGNOSIS — I700 Atherosclerosis of aorta: Secondary | ICD-10-CM | POA: Diagnosis not present

## 2017-01-19 DIAGNOSIS — Z90721 Acquired absence of ovaries, unilateral: Secondary | ICD-10-CM | POA: Insufficient documentation

## 2017-01-19 DIAGNOSIS — R300 Dysuria: Secondary | ICD-10-CM

## 2017-01-19 DIAGNOSIS — K449 Diaphragmatic hernia without obstruction or gangrene: Secondary | ICD-10-CM | POA: Diagnosis not present

## 2017-01-19 DIAGNOSIS — K7689 Other specified diseases of liver: Secondary | ICD-10-CM | POA: Diagnosis not present

## 2017-01-19 LAB — POCT URINALYSIS DIPSTICK
Bilirubin, UA: NEGATIVE
Blood, UA: NEGATIVE
Glucose, UA: NEGATIVE
Ketones, UA: NEGATIVE
Leukocytes, UA: NEGATIVE
Nitrite, UA: NEGATIVE
Protein, UA: NEGATIVE
Spec Grav, UA: 1.01 (ref 1.010–1.025)
Urobilinogen, UA: 0.2 E.U./dL
pH, UA: 6 (ref 5.0–8.0)

## 2017-01-19 IMAGING — CT CT ABD-PELV W/O CM
2 of 4 series · 15 of 46 positions shown, 17 images · non-contrast
Comparison: [DATE] abdominal radiograph.

CLINICAL DATA: Left lower quadrant abdominal and left flank pain.
Nephrolithiasis suspected. Prior hysterectomy and left oophorectomy.

EXAM:
CT ABDOMEN AND PELVIS WITHOUT CONTRAST
TECHNIQUE: Multidetector CT imaging of the abdomen and pelvis was performed
following the standard protocol without IV contrast.

[Series 2: axial st · axial · 0.76mm/px · z∈[-506,-76]mm · 12 of 96 slices shown, 14 images]
[im 5/96  soft-tissue]
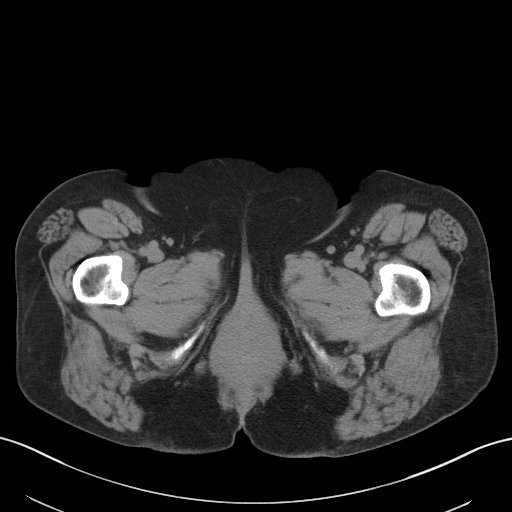
[im 5/96  bone]
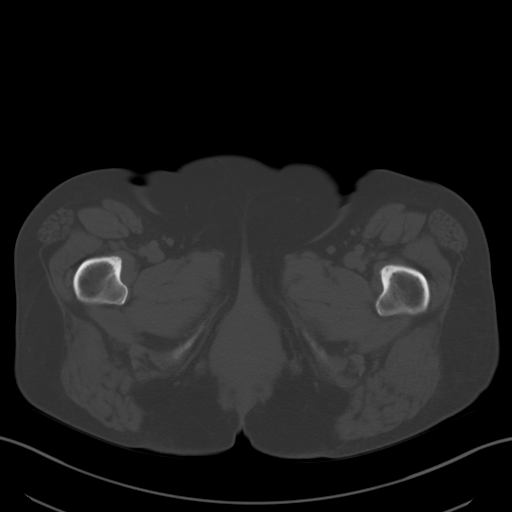
[im 13/96  soft-tissue]
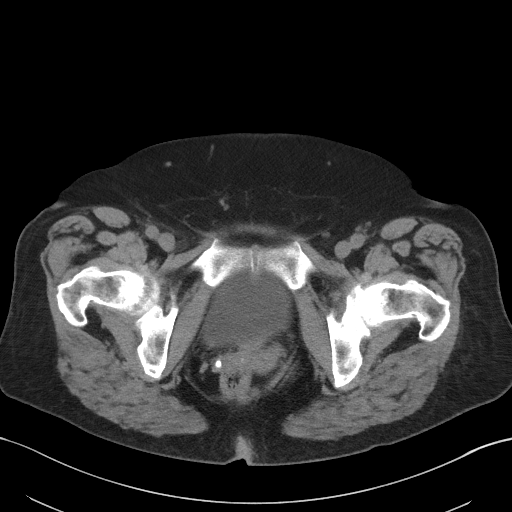
[im 21/96  soft-tissue]
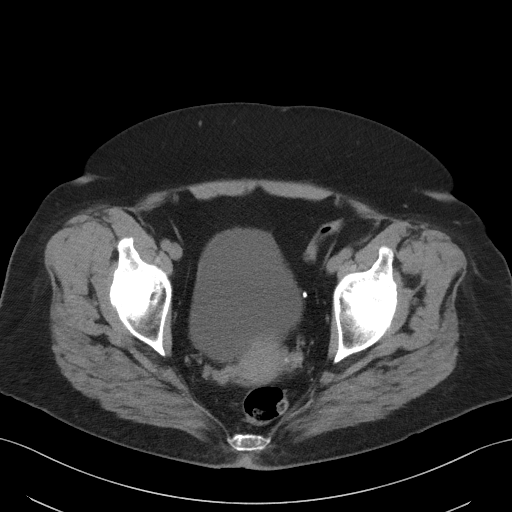
[im 29/96  soft-tissue]
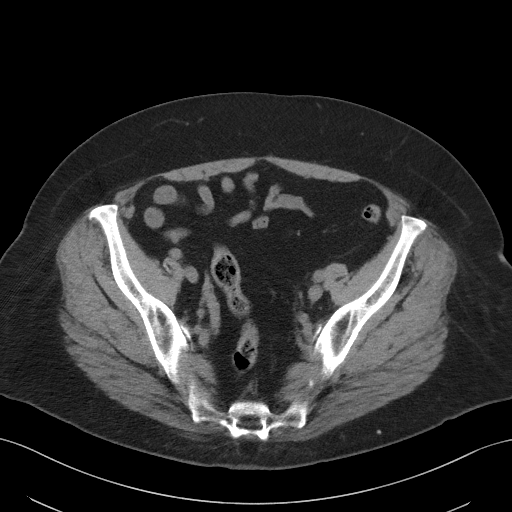
[im 38/96  soft-tissue]
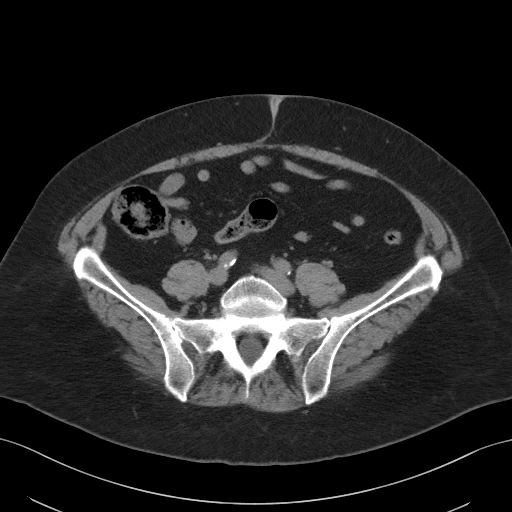
[im 46/96  soft-tissue]
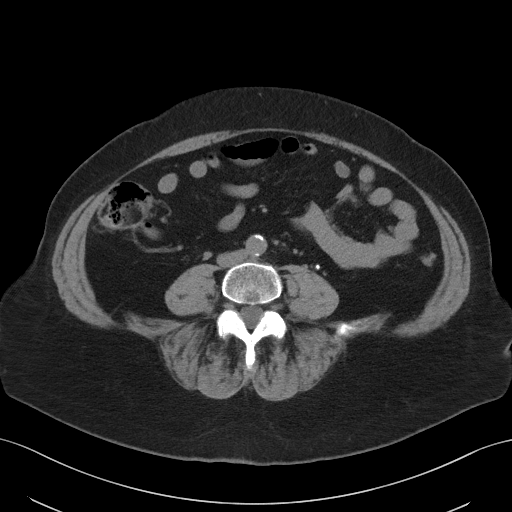
[im 50/96  soft-tissue]
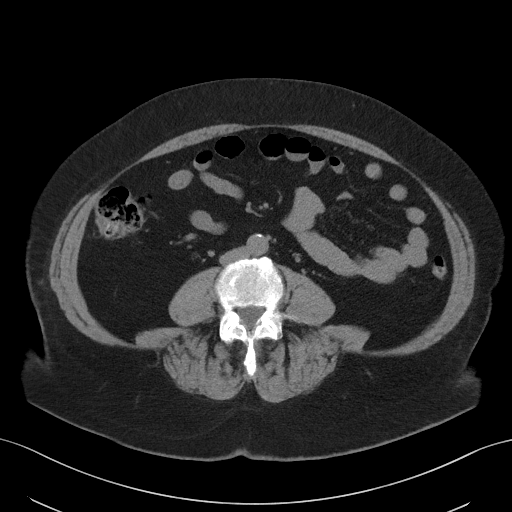
[im 58/96  soft-tissue]
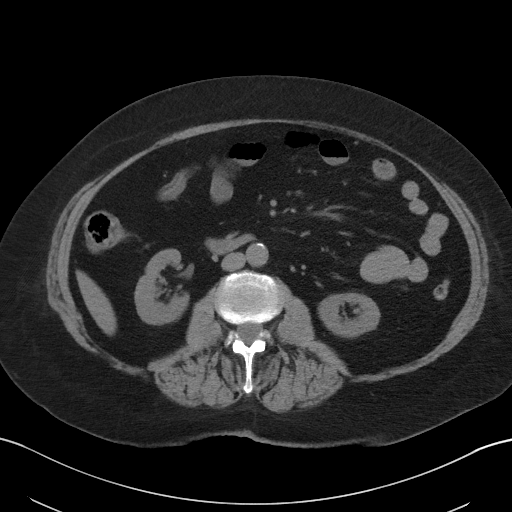
[im 67/96  soft-tissue]
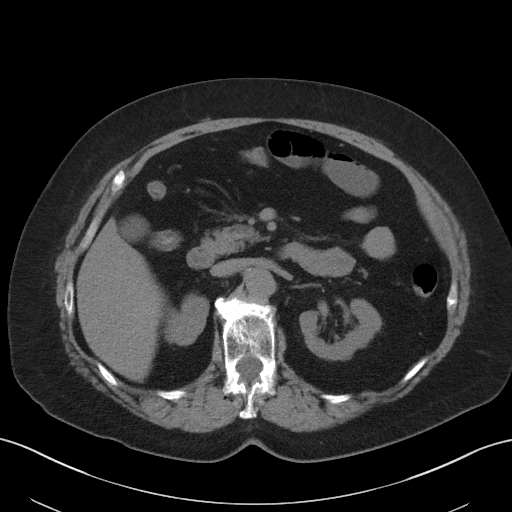
[im 67/96  bone]
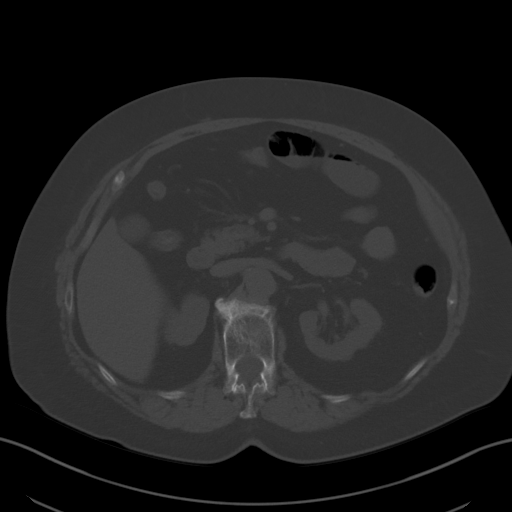
[im 75/96  soft-tissue]
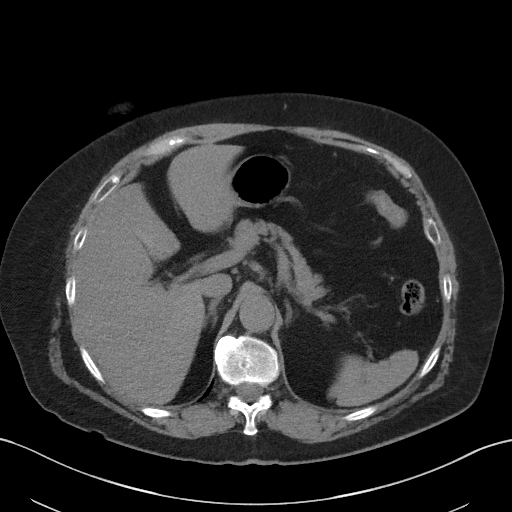
[im 83/96  soft-tissue]
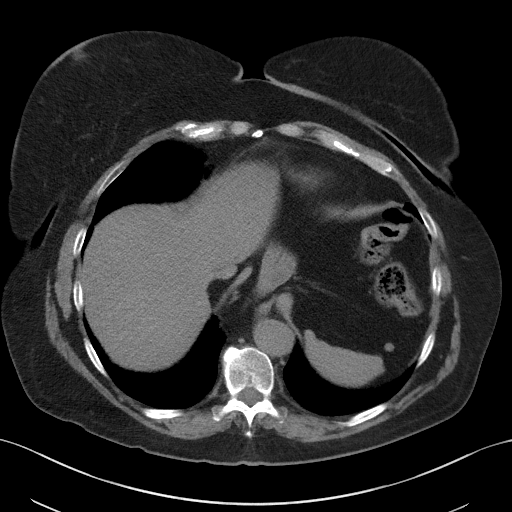
[im 91/96  soft-tissue]
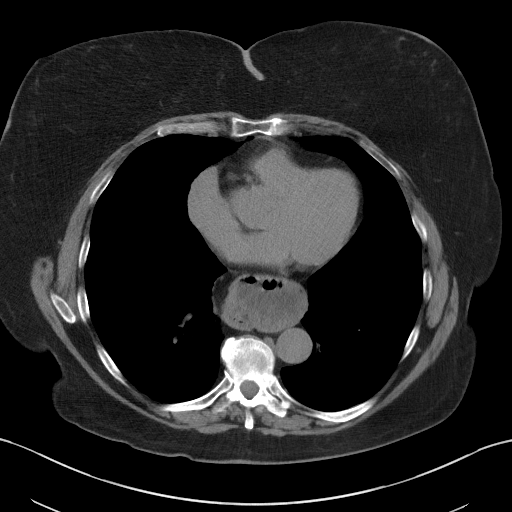

[Series 5: coronal st · coronal · 0.72mm/px · 3 of 98 slices shown]
[im 33/98  soft-tissue]
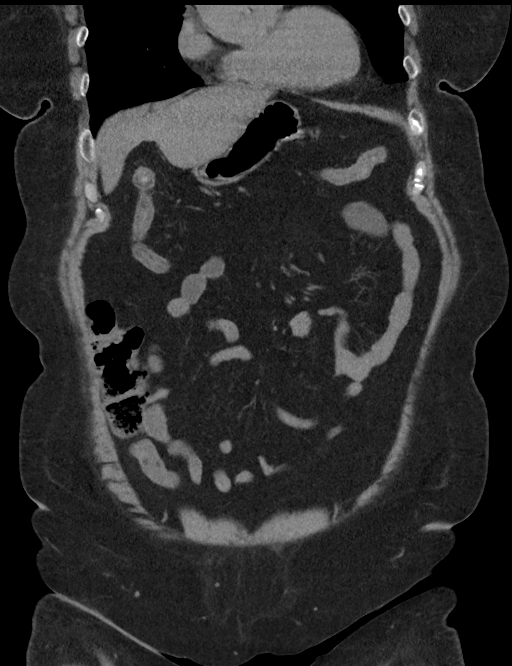
[im 44/98  soft-tissue]
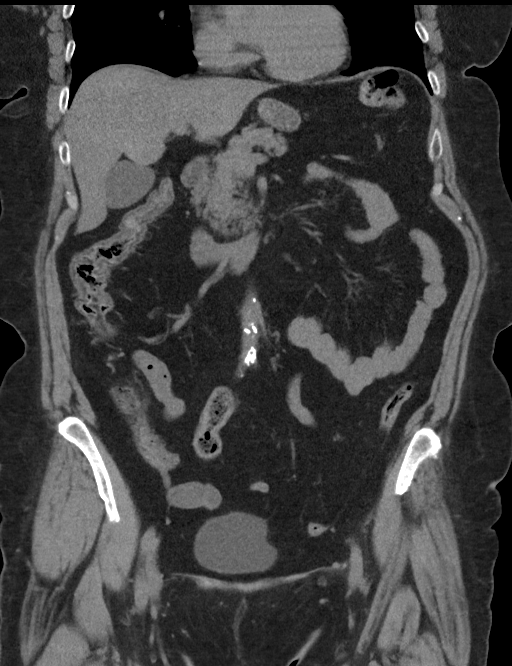
[im 54/98  soft-tissue]
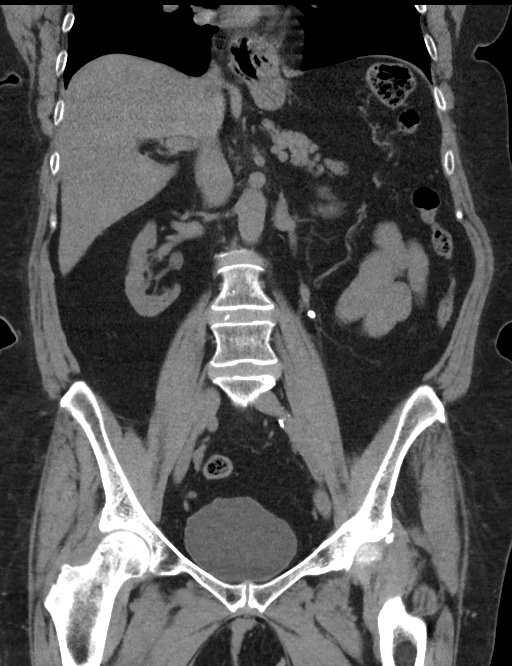

[15 of 46 positions shown; findings below may reference images not displayed]

FINDINGS: Lower chest: No significant pulmonary nodules or acute consolidative
airspace disease.

Hepatobiliary: Mild diffuse hepatic steatosis. Normal liver size. No
definite liver surface irregularity. Simple 1.0 cm lateral segment
left liver lobe cyst. No additional liver lesions. Possible tiny 3
mm gallstone in the nondistended gallbladder, with no gallbladder
wall thickening or pericholecystic fluid could No biliary ductal
dilatation.

Pancreas: Normal, with no mass or duct dilation.

Spleen: Normal size. No mass.

Adrenals/Urinary Tract: Normal adrenals. No renal stones. No
hydronephrosis. Medial upper left kidney 1.0 cm angiomyolipoma
(series 2/ image 29). No additional contour deforming renal lesions.
Normal caliber ureters, no ureteral stones. Normal bladder.

Stomach/Bowel: Moderate hiatal hernia. Nondistended and otherwise
grossly normal stomach. Normal caliber small bowel with no small
bowel wall thickening. Normal appendix. Normal large bowel with no
diverticulosis, large bowel wall thickening or pericolonic fat
stranding.

Vascular/Lymphatic: Atherosclerotic nonaneurysmal abdominal aorta.
No pathologically enlarged lymph nodes in the abdomen or pelvis.

Reproductive: There is a 7.1 x 3.2 x 4.9 cm soft tissue mass
posterior to the bladder (series 6/ image 62), which morphologically
resembles a grossly normal uterus in this patient with an electronic
medical history of hysterectomy. Normal right ovary. Status post
left oophorectomy. No adnexal masses.

Other: No pneumoperitoneum, ascites or focal fluid collection.

Musculoskeletal: No aggressive appearing focal osseous lesions.
Moderate thoracolumbar spondylosis.
IMPRESSION: 1. No hydronephrosis.  No urolithiasis.
2. No evidence of bowel obstruction or acute bowel inflammation.
Normal appendix.
3. Possible cholelithiasis. No evidence of acute cholecystitis. No
biliary ductal dilatation.
4. Moderate hiatal hernia.
5. Mild diffuse hepatic steatosis.
6. Soft tissue mass posterior to the bladder that morphologically
resembles a grossly normal uterus in this patient with an electronic
medical history of hysterectomy (per [REDACTED]). Correlation with
accurate gynecologic surgical history is necessary.

## 2017-01-19 MED ORDER — LEVOTHYROXINE SODIUM 88 MCG PO TABS: ORAL_TABLET | ORAL | 3 refills | Status: DC

## 2017-01-19 MED ORDER — LEVOTHYROXINE SODIUM 100 MCG PO TABS: ORAL_TABLET | ORAL | 3 refills | Status: DC

## 2017-01-19 MED ORDER — PHENAZOPYRIDINE HCL 200 MG PO TABS: 200.0000 mg | ORAL_TABLET | Freq: Three times a day (TID) | ORAL | 0 refills | Status: DC | PRN

## 2017-01-19 NOTE — Telephone Encounter (Signed)
[facility] CT department called advising that someone was in the patient's chart. I asked all clinical, providers and non clinical staff if anyone was in the chart and no one was. I advised the rep to call [facility] IT to exit the person (unknown) out of the chart, she stated "that is not on her to do." No further action required for [facility] office unless someone is mistakenly in the chart.

## 2017-01-19 NOTE — Progress Notes (Signed)
Latasha Mclaughlin is a 71 y.o. female here for an acute visit.  History of Present Illness:   Water quality scientist, CMA, acting as scribe for Dr. [name].  HPI:  Patient began having left lower abdominal pain last Thursday.  States the pain is "excrutiating" when she wakes up in the morning.  Pain eases when she gets up and moves around and after using the bathroom.  Pain has moved around into her lower back as well.  She is worried she may have a kidney stone.  She has had negative UAs x2.  PMHx, SurgHx, SocialHx, Medications, and Allergies were reviewed in the Visit Navigator and updated as appropriate.  Current Medications:   Current Outpatient Prescriptions:  .  Cholecalciferol (VITAMIN D-1000 MAX ST) 1000 units tablet, Take 2,000 Units by mouth daily., Disp: , Rfl:  .  levothyroxine (SYNTHROID) 100 MCG tablet, Take 1 tablet every third day in a 3 day cycle for 90 days.  PATIENT MUST HAVE BRAND NAME SYNTHROID, Disp: 60 tablet, Rfl: 3 .  levothyroxine (SYNTHROID) 88 MCG tablet, Take 88 mcg by mouth., Disp: , Rfl:  .  pravastatin (PRAVACHOL) 20 MG tablet, Take 1 tablet (20 mg total) by mouth daily., Disp: 90 tablet, Rfl: 3 .  levothyroxine (SYNTHROID) 88 MCG tablet, Take 1 tablet every first and second day in a 3 day cycle for 90 days.  MUST HAVE BRAND NAME SYNTHROID, Disp: 90 tablet, Rfl: 3   Allergies  Allergen Reactions  . Prevacid [Lansoprazole] Rash   Review of Systems:   Pertinent items are noted in the HPI. Otherwise, ROS is negative.  Vitals:   Vitals:   01/19/17 1028  BP: 122/80  Pulse: 66  Temp: 98.5 F (36.9 C)  TempSrc: Oral  SpO2: 97%  Weight: 183 lb 3.2 oz (83.1 kg)  Height: 5\' 6"  (1.676 m)     Body mass index is 29.57 kg/m.   Physical Exam:   Physical Exam  Constitutional: She appears well-nourished.  HENT:  Head: Normocephalic and atraumatic.  Eyes: Pupils are equal, round, and reactive to light. EOM are normal.  Neck: Normal range of motion. Neck supple.    Cardiovascular: Normal rate, regular rhythm, normal heart sounds and intact distal pulses.   Pulmonary/Chest: Effort normal.  Abdominal: Soft. There is tenderness in the periumbilical area and left lower quadrant. There is guarding.  Skin: Skin is warm.  Psychiatric: She has a normal mood and affect. Her behavior is normal.  Nursing note and vitals reviewed.  Results for orders placed or performed in visit on 01/19/17  POCT Urinalysis Dipstick  Result Value Ref Range   Color, UA Yellow    Clarity, UA Clear    Glucose, UA Negative    Bilirubin, UA Negative    Ketones, UA Negative    Spec Grav, UA 1.010 1.010 - 1.025   Blood, UA Negative    pH, UA 6.0 5.0 - 8.0   Protein, UA Negative    Urobilinogen, UA 0.2 0.2 or 1.0 E.U./dL   Nitrite, UA Negative    Leukocytes, UA Negative Negative   Assessment and Plan:   [name] was seen today for acute visit.  Diagnoses and all orders for this visit:  Dysuria -     POCT Urinalysis Dipstick  Encounter for immunization -     Flu vaccine HIGH DOSE PF  Left lower quadrant pain Comments: Patient's clinic note from 2 days ago was reviewed. I was actually in the office and discuss the  case with Tor Netters at that time as well. Since the patient is still having moderate to severe pain, we will go ahead and obtain a CT today. Orders: -     CT RENAL STONE STUDY; Future  . Reviewed expectations re: course of current medical issues. . Discussed self-management of symptoms. . Outlined signs and symptoms indicating need for more acute intervention. . Patient verbalized understanding and all questions were answered. Marland Kitchen Health Maintenance issues including appropriate healthy diet, exercise, and smoking avoidance were discussed with patient. . See orders for this visit as documented in the electronic medical record. . Patient received an After Visit Summary.  CMA served as Education administrator during this visit. History, Physical, and Plan performed by  medical provider. The above documentation has been reviewed and is accurate and complete. Briscoe Deutscher, D.O.  Briscoe Deutscher, DO [place] 01/22/2017  Future Appointments Date Time Provider [place]  06/17/2017 11:00 AM Briscoe Deutscher, DO [facility] None

## 2017-01-21 ENCOUNTER — Other Ambulatory Visit: Payer: Self-pay

## 2017-01-21 ENCOUNTER — Encounter: Payer: Self-pay | Admitting: Family Medicine

## 2017-01-21 DIAGNOSIS — R300 Dysuria: Secondary | ICD-10-CM

## 2017-01-21 DIAGNOSIS — R1032 Left lower quadrant pain: Secondary | ICD-10-CM

## 2017-01-28 DIAGNOSIS — R350 Frequency of micturition: Secondary | ICD-10-CM | POA: Diagnosis not present

## 2017-01-28 DIAGNOSIS — R3121 Asymptomatic microscopic hematuria: Secondary | ICD-10-CM | POA: Diagnosis not present

## 2017-01-28 DIAGNOSIS — R351 Nocturia: Secondary | ICD-10-CM | POA: Diagnosis not present

## 2017-02-03 DIAGNOSIS — R3121 Asymptomatic microscopic hematuria: Secondary | ICD-10-CM | POA: Diagnosis not present

## 2017-02-03 DIAGNOSIS — R3129 Other microscopic hematuria: Secondary | ICD-10-CM | POA: Diagnosis not present

## 2017-02-11 ENCOUNTER — Telehealth: Payer: Self-pay | Admitting: Family Medicine

## 2017-02-11 DIAGNOSIS — R109 Unspecified abdominal pain: Secondary | ICD-10-CM | POA: Diagnosis not present

## 2017-02-11 DIAGNOSIS — R350 Frequency of micturition: Secondary | ICD-10-CM | POA: Diagnosis not present

## 2017-02-11 DIAGNOSIS — N201 Calculus of ureter: Secondary | ICD-10-CM | POA: Diagnosis not present

## 2017-02-11 DIAGNOSIS — R1032 Left lower quadrant pain: Secondary | ICD-10-CM | POA: Diagnosis not present

## 2017-02-11 NOTE — Telephone Encounter (Signed)
Patient called in reference to visit from 21-Dec-2016 needing to be "re coded" since it was "not a routine set up".  Patient has already spoke to billing and billing said they are going to "investigate". Please advise if change is needed.

## 2017-02-11 NOTE — Telephone Encounter (Signed)
Done as CPE. Will give ifo to Lea.

## 2017-03-03 ENCOUNTER — Encounter: Payer: Self-pay | Admitting: Family Medicine

## 2017-04-25 IMAGING — US US ABDOMEN LIMITED
1 series · 14 of 25 positions shown · non-contrast
Comparison: CT abdomen and pelvis [DATE]

CLINICAL DATA: RIGHT upper quadrant abdominal pain and bloating for
1 month

EXAM:
ULTRASOUND ABDOMEN LIMITED RIGHT UPPER QUADRANT

[Series 1: us abdomen limited · 0.14mm/px · 14 of 53 slices shown]
[im 1/53]
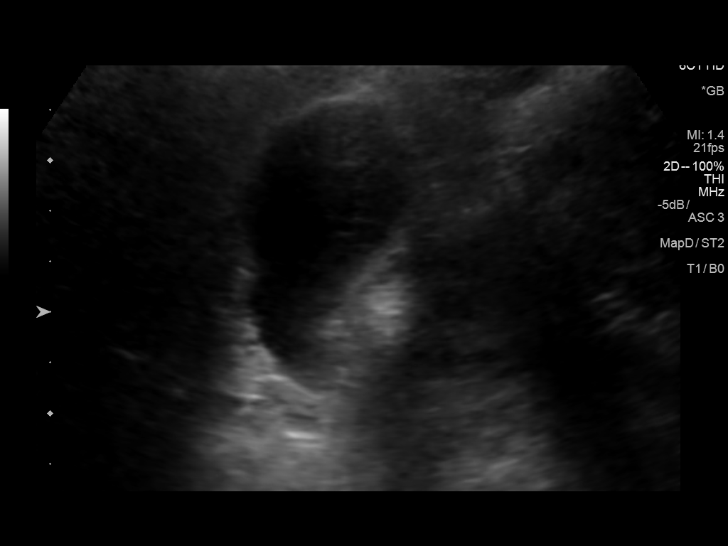
[im 5/53]
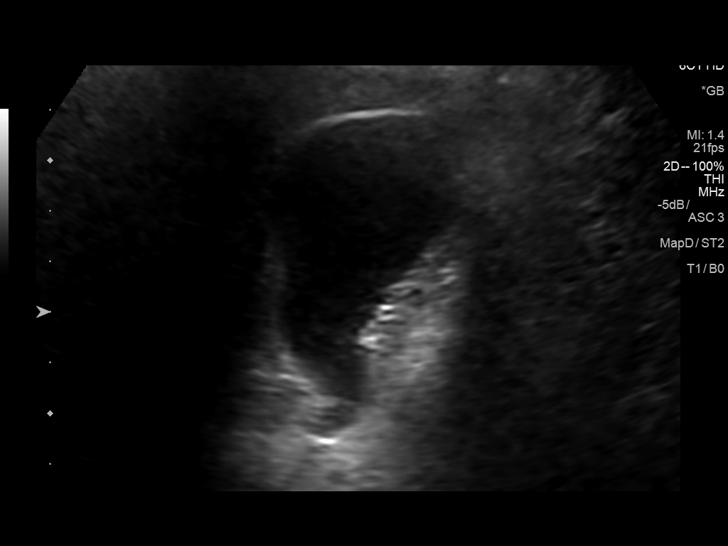
[im 9/53]
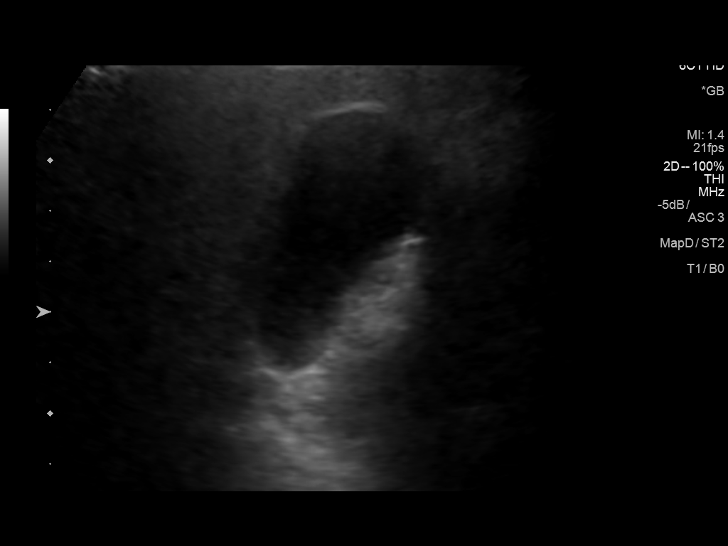
[im 14/53]
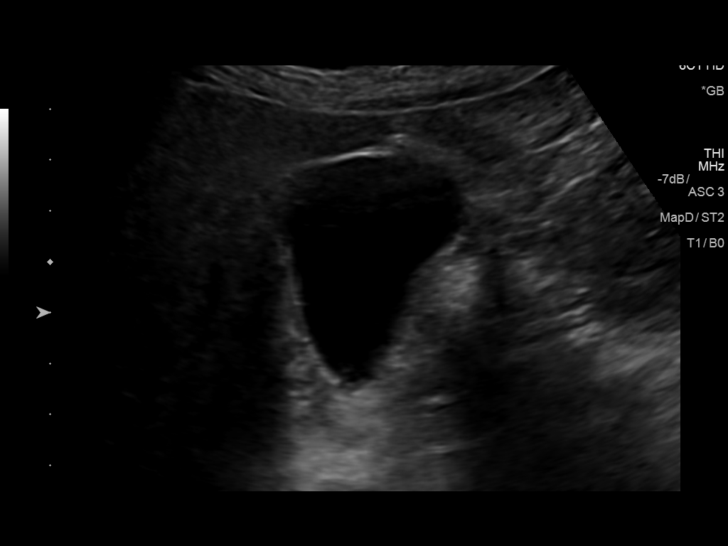
[im 18/53]
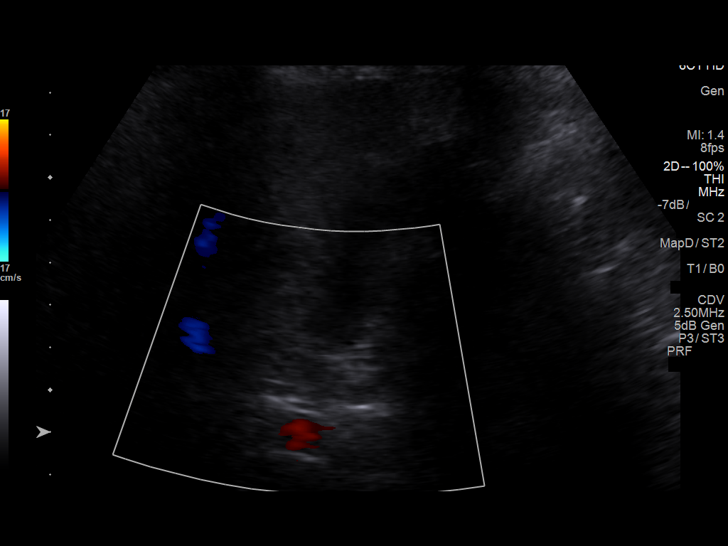
[im 20/53]
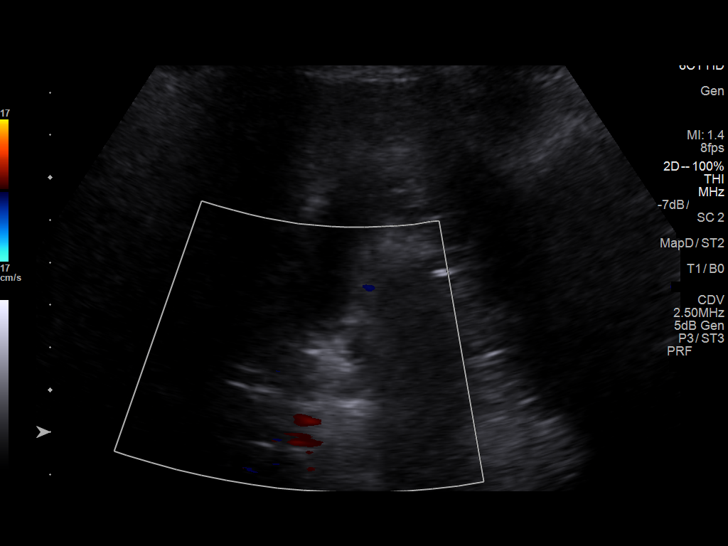
[im 24/53]
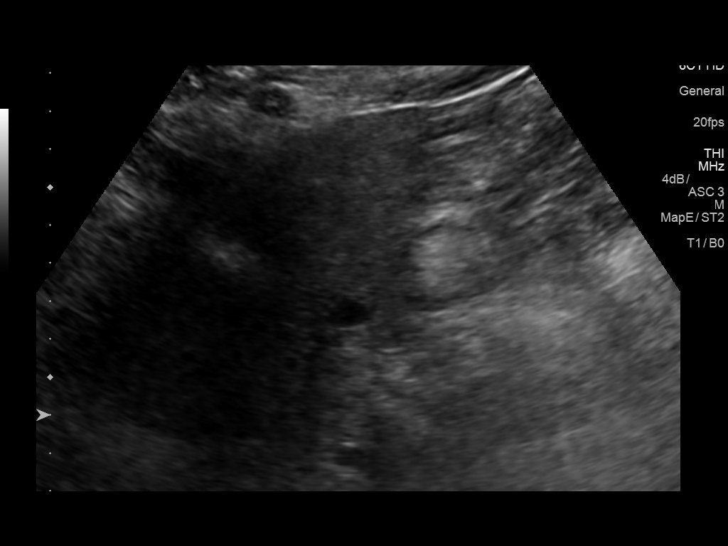
[im 29/53]
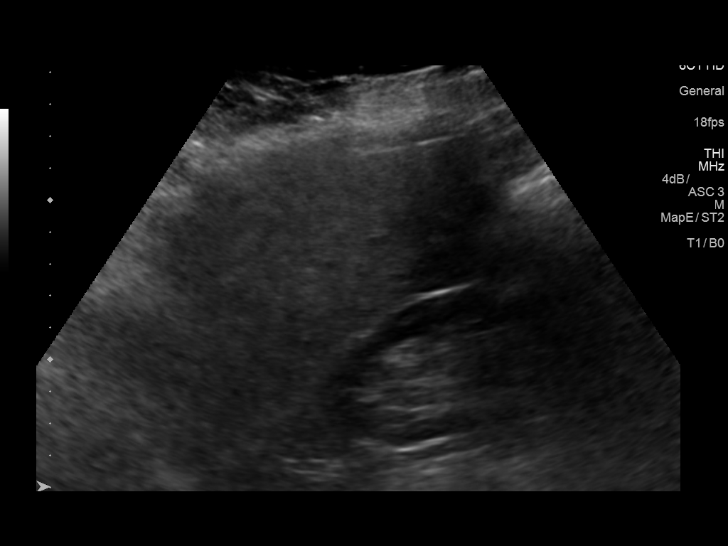
[im 33/53]
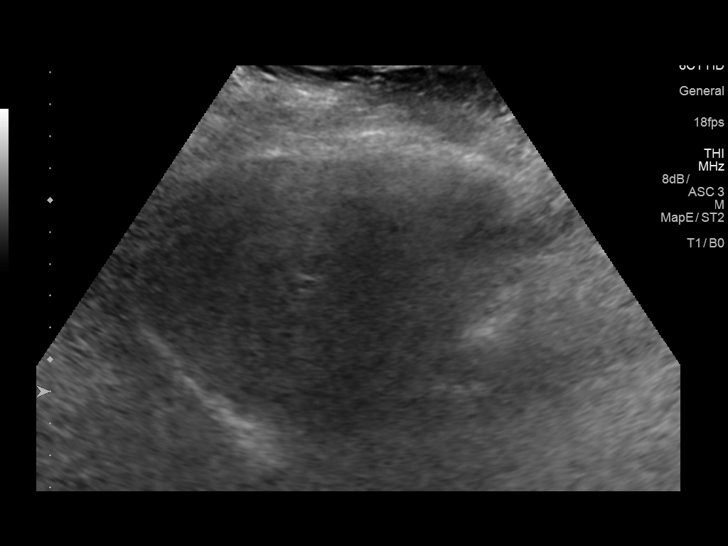
[im 35/53]
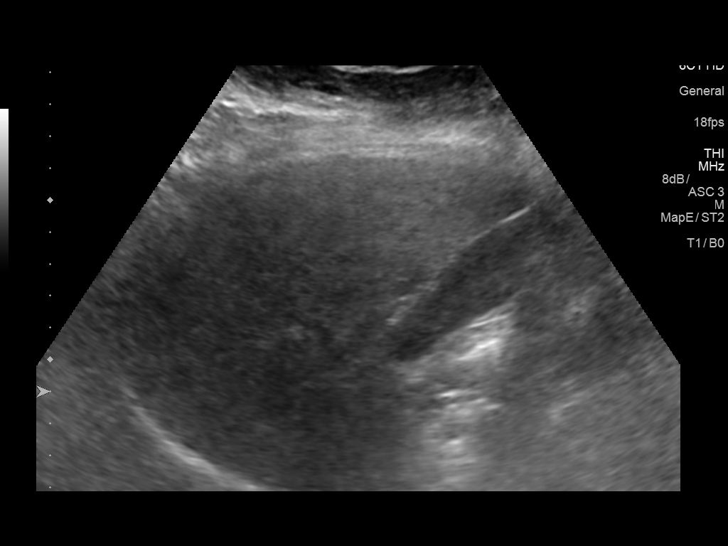
[im 40/53]
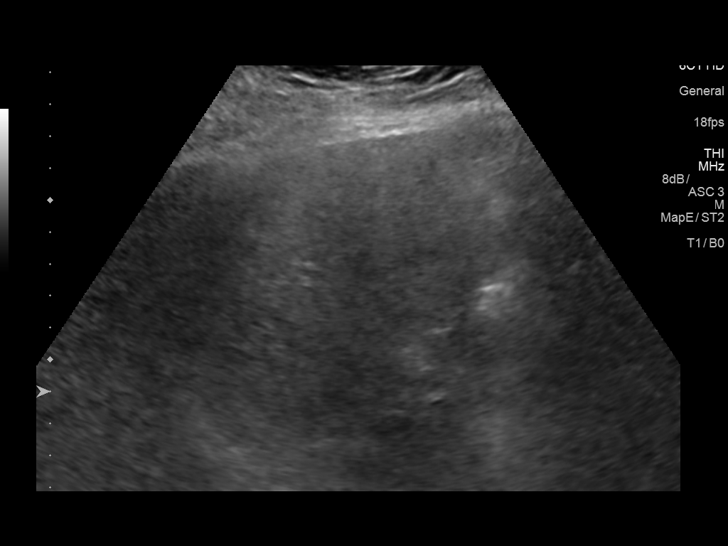
[im 44/53]
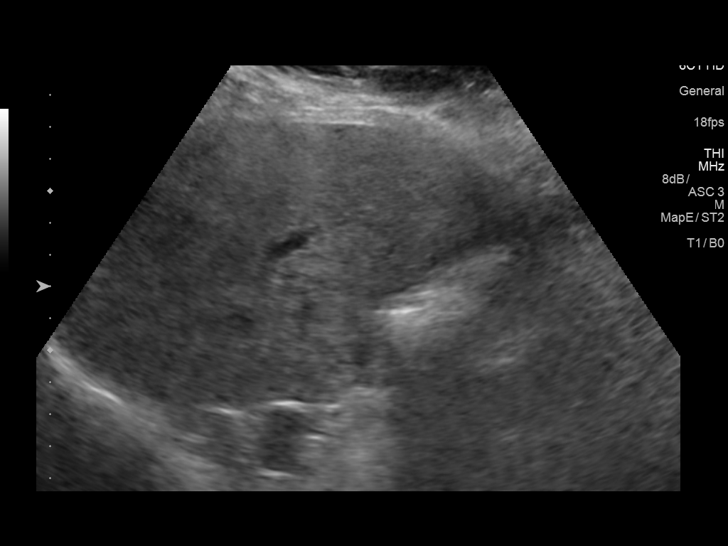
[im 48/53]
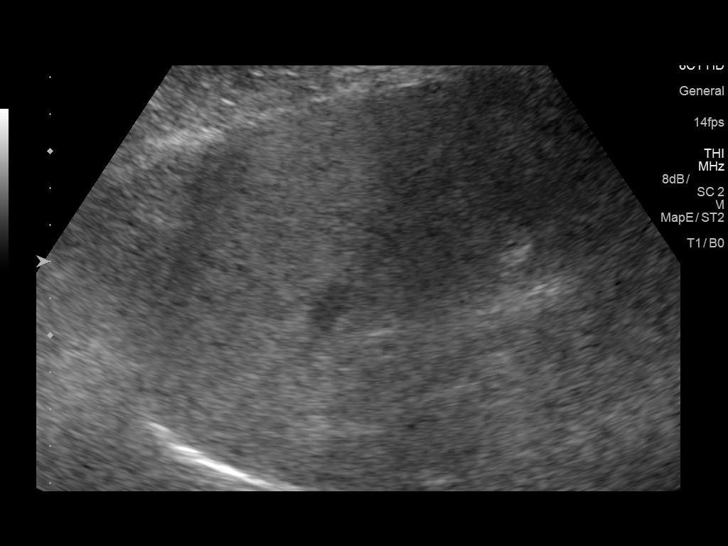
[im 53/53]
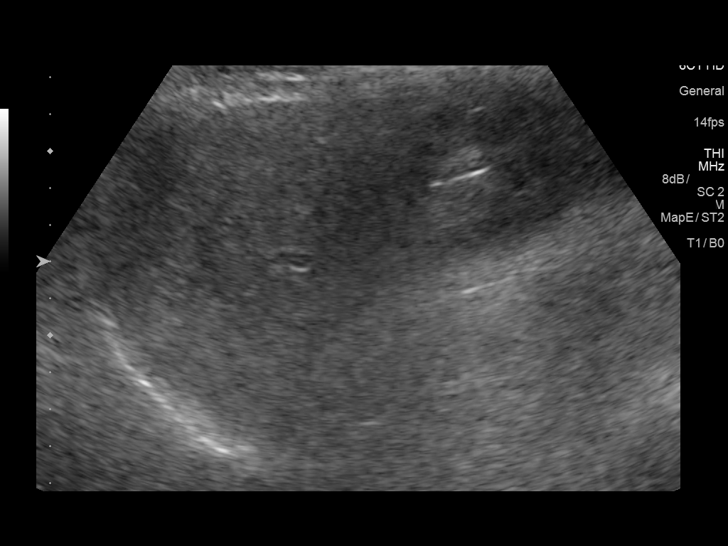

[14 of 25 positions shown; findings below may reference images not displayed]

FINDINGS: Gallbladder:

Normally distended without stones or wall thickening. No
pericholecystic fluid or sonographic Murphy sign.

Common bile duct:

Diameter: 4 mm diameter, normal

Liver:

Echogenic parenchyma, likely fatty infiltration though this can be
seen with cirrhosis and certain infiltrative disorders. Small cyst
lateral segment LEFT lobe 13 x 10 x 8 mm. No additional hepatic mass
lesions. Portal vein is patent on color Doppler imaging with normal
direction of blood flow towards the liver.

No RIGHT upper quadrant ascites.
IMPRESSION: Probable fatty infiltration of liver as above.

Small LEFT lobe hepatic cyst slightly increased since [DATE].

## 2017-05-09 DIAGNOSIS — G43109 Migraine with aura, not intractable, without status migrainosus: Secondary | ICD-10-CM | POA: Diagnosis not present

## 2017-05-09 DIAGNOSIS — H43813 Vitreous degeneration, bilateral: Secondary | ICD-10-CM | POA: Diagnosis not present

## 2017-05-09 DIAGNOSIS — H259 Unspecified age-related cataract: Secondary | ICD-10-CM | POA: Diagnosis not present

## 2017-06-07 ENCOUNTER — Ambulatory Visit: Payer: Medicare Other | Admitting: Neurology

## 2017-06-17 ENCOUNTER — Telehealth: Payer: Self-pay

## 2017-06-17 ENCOUNTER — Other Ambulatory Visit: Payer: Self-pay | Admitting: Family Medicine

## 2017-06-17 ENCOUNTER — Encounter: Payer: Self-pay | Admitting: Family Medicine

## 2017-06-17 ENCOUNTER — Ambulatory Visit (INDEPENDENT_AMBULATORY_CARE_PROVIDER_SITE_OTHER): Payer: Medicare Other | Admitting: Family Medicine

## 2017-06-17 VITALS — BP 126/82 | HR 63 | Temp 97.6°F | Wt 182.8 lb

## 2017-06-17 DIAGNOSIS — Z1239 Encounter for other screening for malignant neoplasm of breast: Secondary | ICD-10-CM

## 2017-06-17 DIAGNOSIS — Z1231 Encounter for screening mammogram for malignant neoplasm of breast: Secondary | ICD-10-CM

## 2017-06-17 DIAGNOSIS — E039 Hypothyroidism, unspecified: Secondary | ICD-10-CM | POA: Diagnosis not present

## 2017-06-17 DIAGNOSIS — E782 Mixed hyperlipidemia: Secondary | ICD-10-CM | POA: Diagnosis not present

## 2017-06-17 DIAGNOSIS — Z9189 Other specified personal risk factors, not elsewhere classified: Secondary | ICD-10-CM

## 2017-06-17 DIAGNOSIS — Z8639 Personal history of other endocrine, nutritional and metabolic disease: Secondary | ICD-10-CM | POA: Diagnosis not present

## 2017-06-17 DIAGNOSIS — Z1159 Encounter for screening for other viral diseases: Secondary | ICD-10-CM | POA: Diagnosis not present

## 2017-06-17 DIAGNOSIS — R202 Paresthesia of skin: Secondary | ICD-10-CM | POA: Diagnosis not present

## 2017-06-17 DIAGNOSIS — E559 Vitamin D deficiency, unspecified: Secondary | ICD-10-CM | POA: Diagnosis not present

## 2017-06-17 LAB — COMPREHENSIVE METABOLIC PANEL
ALT: 18 U/L (ref 0–35)
AST: 16 U/L (ref 0–37)
Albumin: 4.2 g/dL (ref 3.5–5.2)
Alkaline Phosphatase: 54 U/L (ref 39–117)
BUN: 10 mg/dL (ref 6–23)
CO2: 28 mEq/L (ref 19–32)
Calcium: 9.5 mg/dL (ref 8.4–10.5)
Chloride: 106 mEq/L (ref 96–112)
Creatinine, Ser: 0.98 mg/dL (ref 0.40–1.20)
GFR: 59.45 mL/min — ABNORMAL LOW (ref >60.00)
Glucose, Bld: 100 mg/dL — ABNORMAL HIGH (ref 70–99)
Potassium: 4.3 mEq/L (ref 3.5–5.1)
Sodium: 141 mEq/L (ref 135–145)
Total Bilirubin: 1 mg/dL (ref 0.2–1.2)
Total Protein: 6.7 g/dL (ref 6.0–8.3)

## 2017-06-17 LAB — LIPID PANEL
Cholesterol: 258 mg/dL — ABNORMAL HIGH (ref 0–200)
HDL: 39.5 mg/dL (ref >39.00)
LDL Cholesterol: 179 mg/dL — ABNORMAL HIGH (ref 0–99)
NonHDL: 218.31
Total CHOL/HDL Ratio: 7
Triglycerides: 198 mg/dL — ABNORMAL HIGH (ref 0.0–149.0)
VLDL: 39.6 mg/dL (ref 0.0–40.0)

## 2017-06-17 LAB — CBC WITH DIFFERENTIAL/PLATELET
Basophils Absolute: 0.1 10*3/uL (ref 0.0–0.1)
Basophils Relative: 1.2 % (ref 0.0–3.0)
Eosinophils Absolute: 0.1 10*3/uL (ref 0.0–0.7)
Eosinophils Relative: 2.1 % (ref 0.0–5.0)
HCT: 44.3 % (ref 36.0–46.0)
Hemoglobin: 14.9 g/dL (ref 12.0–15.0)
Lymphocytes Relative: 33.5 % (ref 12.0–46.0)
Lymphs Abs: 1.8 10*3/uL (ref 0.7–4.0)
MCHC: 33.7 g/dL (ref 30.0–36.0)
MCV: 92.6 fl (ref 78.0–100.0)
Monocytes Absolute: 0.5 10*3/uL (ref 0.1–1.0)
Monocytes Relative: 8.7 % (ref 3.0–12.0)
Neutro Abs: 2.9 10*3/uL (ref 1.4–7.7)
Neutrophils Relative %: 54.5 % (ref 43.0–77.0)
Platelets: 279 10*3/uL (ref 150.0–400.0)
RBC: 4.78 Mil/uL (ref 3.87–5.11)
RDW: 13.5 % (ref 11.5–15.5)
WBC: 5.4 10*3/uL (ref 4.0–10.5)

## 2017-06-17 LAB — VITAMIN D 25 HYDROXY (VIT D DEFICIENCY, FRACTURES): VITD: 46.82 ng/mL (ref 30.00–100.00)

## 2017-06-17 LAB — T4, FREE: Free T4: 1.23 ng/dL (ref 0.60–1.60)

## 2017-06-17 LAB — TSH: TSH: 0.64 u[IU]/mL (ref 0.35–4.50)

## 2017-06-17 MED ORDER — ROSUVASTATIN CALCIUM 10 MG PO TABS: 10.0000 mg | ORAL_TABLET | Freq: Every day | ORAL | 3 refills | Status: DC

## 2017-06-17 NOTE — Telephone Encounter (Signed)
Patient seen today app made for mammogram. She wanted to know if she needed bone density. If so I will call and make at same time.

## 2017-06-17 NOTE — Patient Instructions (Signed)
Start an 81 mg aspirin daily.

## 2017-06-18 LAB — HEPATITIS C ANTIBODY
Hepatitis C Ab: NONREACTIVE
SIGNAL TO CUT-OFF: 0.01 (ref <1.00)

## 2017-06-19 NOTE — Telephone Encounter (Signed)
Yes, due to for DEXA.

## 2017-06-20 NOTE — Progress Notes (Signed)
Latasha Mclaughlin is a 72 y.o. female is here for follow up.  History of Present Illness:   HPI: See Assessment and Plan section for Problem Based Charting of issues discussed today.   Health Maintenance Due  Topic Date Due  . TETANUS/TDAP  05/21/1965   Depression screen PHQ 2/9 12/15/2016  Decreased Interest 0  Down, Depressed, Hopeless 0  PHQ - 2 Score 0   PMHx, SurgHx, SocialHx, FamHx, Medications, and Allergies were reviewed in the Visit Navigator and updated as appropriate.   Patient Active Problem List   Diagnosis Date Noted  . History of hyperparathyroidism 01/08/2017  . Myalgia 01/05/2017  . Arthralgia 01/05/2017  . Fatigue 01/05/2017  . Paresthesia 01/05/2017  . Overweight (BMI 25.0-29.9) 12/16/2016  . Hypothyroidism 12/15/2016  . Hyperlipidemia 12/15/2016   Social History   Tobacco Use  . Smoking status: Former Research scientist (life sciences)  . Smokeless tobacco: Never Used  . Tobacco comment: Quite in 1985  Substance Use Topics  . Alcohol use: No  . Drug use: No   Current Medications and Allergies:   .  Cholecalciferol (VITAMIN D-1000 MAX ST) 1000 units tablet, Take 2,000 Units by mouth daily., Disp: , Rfl:  .  levothyroxine (SYNTHROID) 100 MCG tablet, Take 1 tablet every third day in a 3 day cycle for 90 days.  PATIENT MUST HAVE BRAND NAME SYNTHROID, Disp: 60 tablet, Rfl: 3 .  levothyroxine (SYNTHROID) 88 MCG tablet, Take 1 tablet every first and second day in a 3 day cycle for 90 days.  MUST HAVE BRAND NAME SYNTHROID, Disp: 90 tablet, Rfl: 3 .  rosuvastatin (CRESTOR) 10 MG tablet, Take 1 tablet (10 mg total) by mouth daily., Disp: 90 tablet, Rfl: 3   Allergies  Allergen Reactions  . Prevacid [Lansoprazole] Rash   Review of Systems   Pertinent items are noted in the HPI. Otherwise, ROS is negative.  Vitals:   Vitals:   06/17/17 1036  BP: 126/82  Pulse: 63  Temp: 97.6 F (36.4 C)  TempSrc: Oral  SpO2: 96%  Weight: 182 lb 12.8 oz (82.9 kg)     Body mass index is  29.5 kg/m.   Physical Exam:   Physical Exam  Constitutional: She is oriented to person, place, and time. She appears well-developed and well-nourished. No distress.  HENT:  Head: Normocephalic and atraumatic.  Right Ear: External ear normal.  Left Ear: External ear normal.  Nose: Nose normal.  Mouth/Throat: Oropharynx is clear and moist.  Eyes: Conjunctivae and EOM are normal. Pupils are equal, round, and reactive to light.  Neck: Normal range of motion. Neck supple. No thyromegaly present.  Cardiovascular: Normal rate, regular rhythm, normal heart sounds and intact distal pulses.  Pulmonary/Chest: Effort normal.  Abdominal: Soft.  Neurological: She is alert and oriented to person, place, and time.  Skin: Skin is warm.  Psychiatric: She has a normal mood and affect. Her behavior is normal.  Nursing note and vitals reviewed.   Results for orders placed or performed in visit on 06/17/17  CBC with Differential/Platelet  Result Value Ref Range   WBC 5.4 4.0 - 10.5 K/uL   RBC 4.78 3.87 - 5.11 Mil/uL   Hemoglobin 14.9 12.0 - 15.0 g/dL   HCT 44.3 36.0 - 46.0 %   MCV 92.6 78.0 - 100.0 fl   MCHC 33.7 30.0 - 36.0 g/dL   RDW 13.5 11.5 - 15.5 %   Platelets 279.0 150.0 - 400.0 K/uL   Neutrophils Relative % 54.5 43.0 - 77.0 %  Lymphocytes Relative 33.5 12.0 - 46.0 %   Monocytes Relative 8.7 3.0 - 12.0 %   Eosinophils Relative 2.1 0.0 - 5.0 %   Basophils Relative 1.2 0.0 - 3.0 %   Neutro Abs 2.9 1.4 - 7.7 K/uL   Lymphs Abs 1.8 0.7 - 4.0 K/uL   Monocytes Absolute 0.5 0.1 - 1.0 K/uL   Eosinophils Absolute 0.1 0.0 - 0.7 K/uL   Basophils Absolute 0.1 0.0 - 0.1 K/uL  Comprehensive metabolic panel  Result Value Ref Range   Sodium 141 135 - 145 mEq/L   Potassium 4.3 3.5 - 5.1 mEq/L   Chloride 106 96 - 112 mEq/L   CO2 28 19 - 32 mEq/L   Glucose, Bld 100 (H) 70 - 99 mg/dL   BUN 10 6 - 23 mg/dL   Creatinine, Ser 0.98 0.40 - 1.20 mg/dL   Total Bilirubin 1.0 0.2 - 1.2 mg/dL   Alkaline  Phosphatase 54 39 - 117 U/L   AST 16 0 - 37 U/L   ALT 18 0 - 35 U/L   Total Protein 6.7 6.0 - 8.3 g/dL   Albumin 4.2 3.5 - 5.2 g/dL   Calcium 9.5 8.4 - 10.5 mg/dL   GFR 59.45 (L) >60.00 mL/min  Lipid panel  Result Value Ref Range   Cholesterol 258 (H) 0 - 200 mg/dL   Triglycerides 198.0 (H) 0.0 - 149.0 mg/dL   HDL 39.50 >39.00 mg/dL   VLDL 39.6 0.0 - 40.0 mg/dL   LDL Cholesterol 179 (H) 0 - 99 mg/dL   Total CHOL/HDL Ratio 7    NonHDL 218.31   Hepatitis C antibody  Result Value Ref Range   Hepatitis C Ab NON-REACTIVE NON-REACTI   SIGNAL TO CUT-OFF 0.01 <1.00  VITAMIN D 25 Hydroxy (Vit-D Deficiency, Fractures)  Result Value Ref Range   VITD 46.82 30.00 - 100.00 ng/mL  TSH  Result Value Ref Range   TSH 0.64 0.35 - 4.50 uIU/mL  T4, free  Result Value Ref Range   Free T4 1.23 0.60 - 1.60 ng/dL    Assessment and Plan:   [name] was seen today for follow-up.  Diagnoses and all orders for this visit:  Acquired hypothyroidism Comments: At goal. Endocrine ROS: negative for - hair pattern changes, hot flashes, malaise/lethargy, mood swings, palpitations, skin changes, temperature intolerance or unexpected weight changes. Orders: -     TSH -     T4, free  Mixed hyperlipidemia Comments: She has not been on medication for a few months. Okay check today, but will send in Crestor to be taken three times per week. Orders: -     rosuvastatin (CRESTOR) 10 MG tablet; Take 1 tablet (10 mg total) by mouth daily. -     Comprehensive metabolic panel -     Lipid panel  Encounter for hepatitis C virus screening test for high risk patient  History of hyperparathyroidism -     Hepatitis C antibody  Paresthesia Comments: Mild.  Orders: -     CBC with Differential/Platelet  Vitamin D deficiency -     VITAMIN D 25 Hydroxy (Vit-D Deficiency, Fractures)  Screening for breast cancer -    MM SCREENING BREAST TOMO BILATERAL; Future  . Reviewed expectations re: course of current medical  issues. . Discussed self-management of symptoms. . Outlined signs and symptoms indicating need for more acute intervention. . Patient verbalized understanding and all questions were answered. Marland Kitchen Health Maintenance issues including appropriate healthy diet, exercise, and smoking avoidance were discussed with  patient. . See orders for this visit as documented in the electronic medical record. . Patient received an After Visit Summary.  Briscoe Deutscher, DO [facility] 06/20/2017  Future Appointments  Date Time Provider [place]  06/30/2017 10:30 AM Felecia Shelling, Nanine Means, MD [facility] None  07/15/2017 12:10 PM [facility] 3 [facility]  12/16/2017 10:40 AM Briscoe Deutscher, DO [facility]

## 2017-06-30 ENCOUNTER — Ambulatory Visit: Payer: Medicare Other | Admitting: Neurology

## 2017-07-07 ENCOUNTER — Ambulatory Visit: Payer: Medicare Other

## 2017-07-15 ENCOUNTER — Ambulatory Visit
Admission: RE | Admit: 2017-07-15 | Discharge: 2017-07-15 | Disposition: A | Discharge status: Home / Self Care | Payer: Medicare Other | Source: Ambulatory Visit | Attending: Family Medicine | Admitting: Family Medicine

## 2017-07-15 DIAGNOSIS — Z1231 Encounter for screening mammogram for malignant neoplasm of breast: Secondary | ICD-10-CM

## 2017-07-15 IMAGING — MG DIGITAL SCREENING BILATERAL MAMMOGRAM WITH TOMO AND CAD
8 series · 8 of 24 positions shown · non-contrast
Comparison: None.

ACR Breast Density Category a: The breast tissue is almost entirely
fatty.

CLINICAL DATA: Screening.

EXAM:
DIGITAL SCREENING BILATERAL MAMMOGRAM WITH TOMO AND CAD

[R MLO synth-2D]
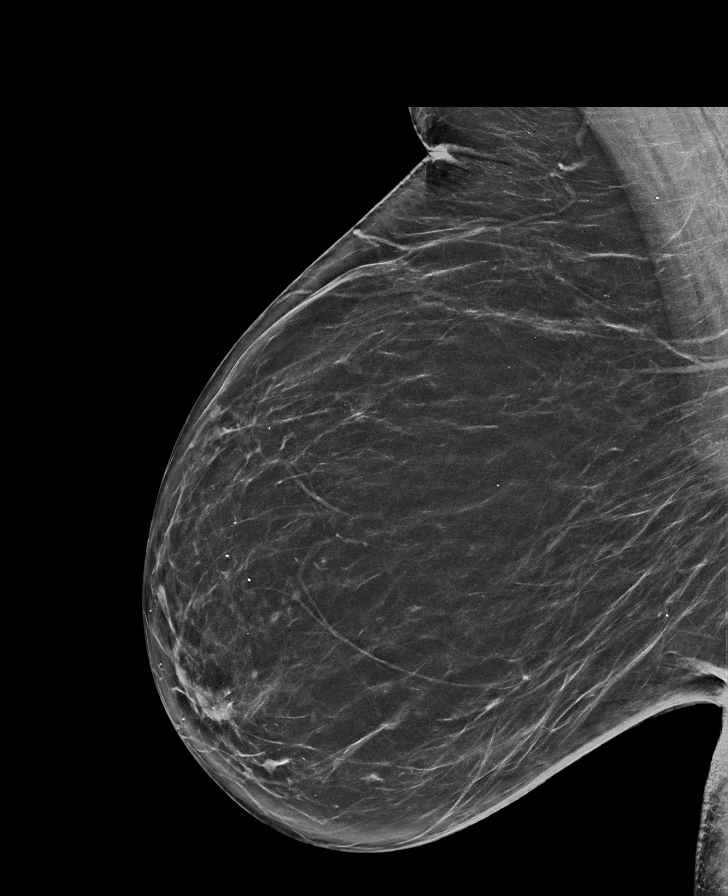

[L MLO synth-2D]
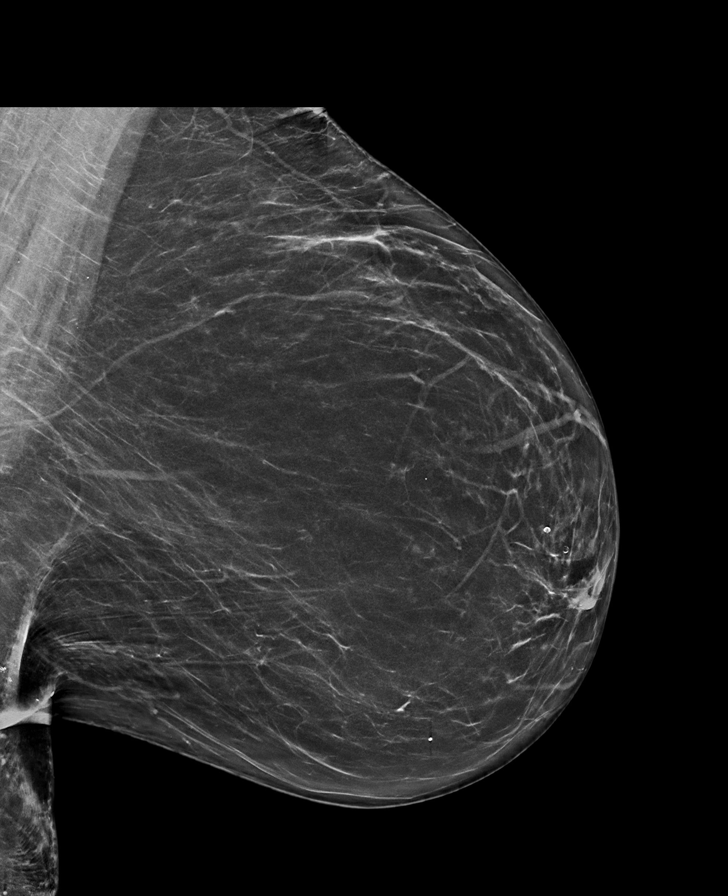

[L CC synth-2D]
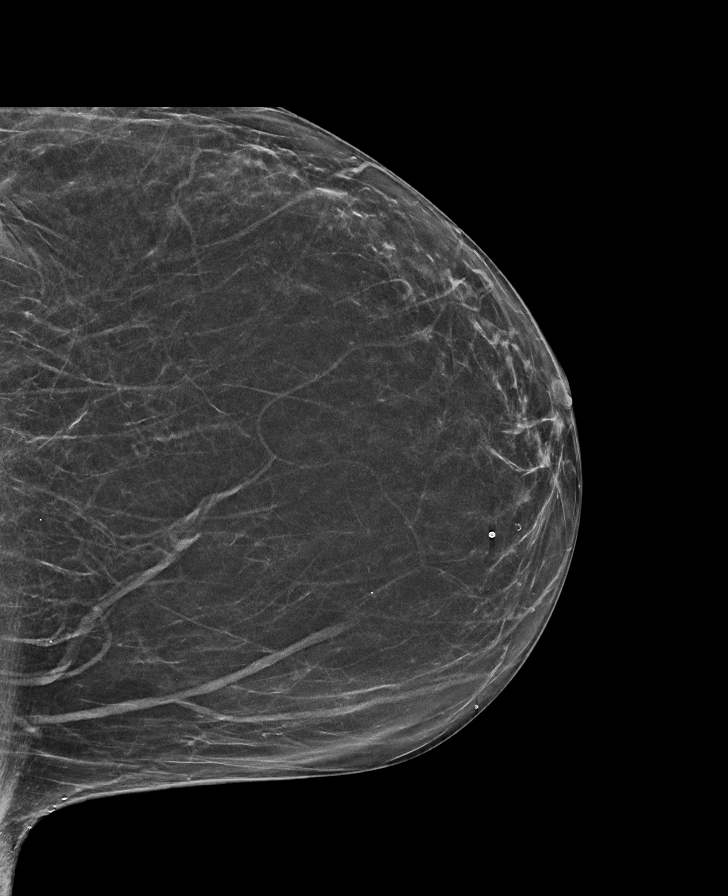

[R CC synth-2D]
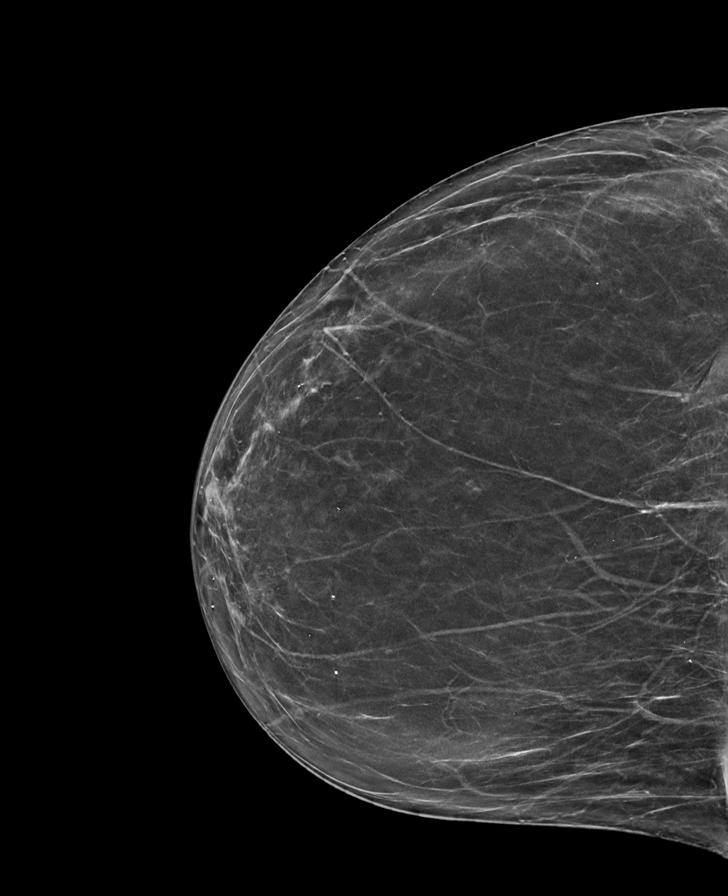

[L MLO tomo · tomo slice 40/79.0]
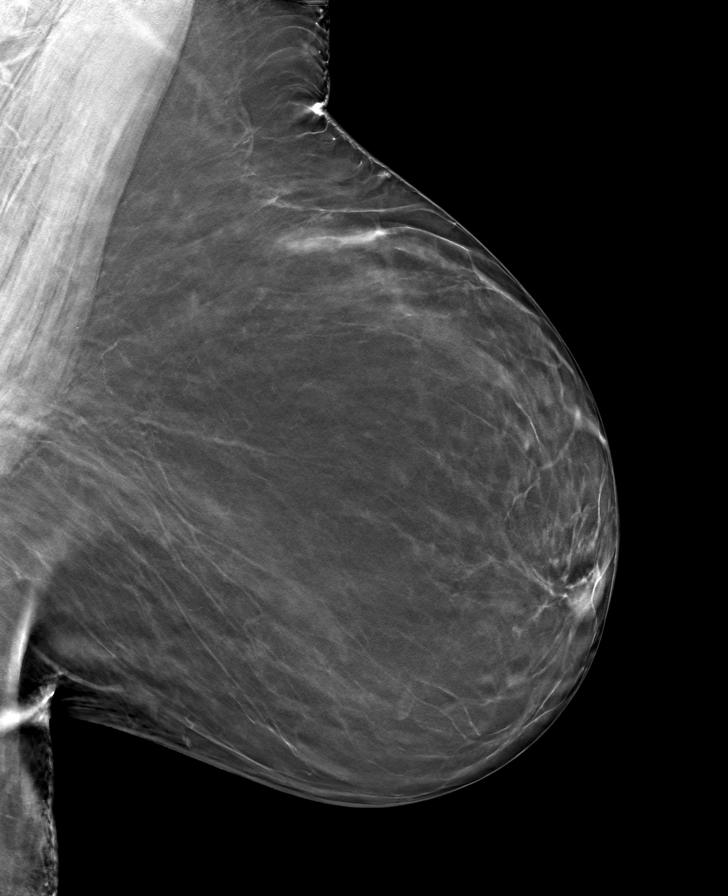

[R CC tomo · tomo slice 35/69.0]
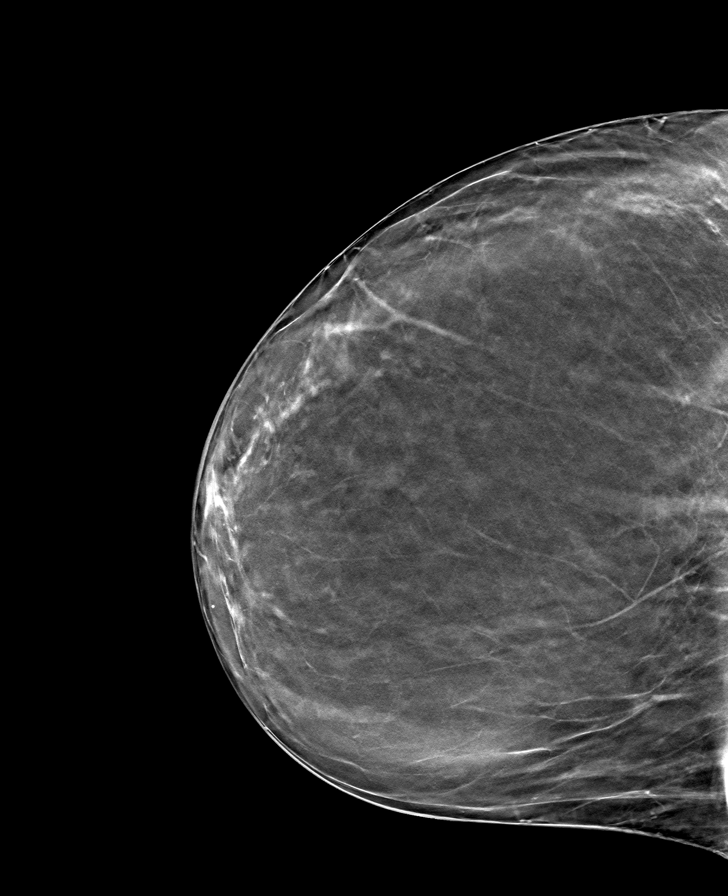

[R MLO tomo · tomo slice 39/77.0]
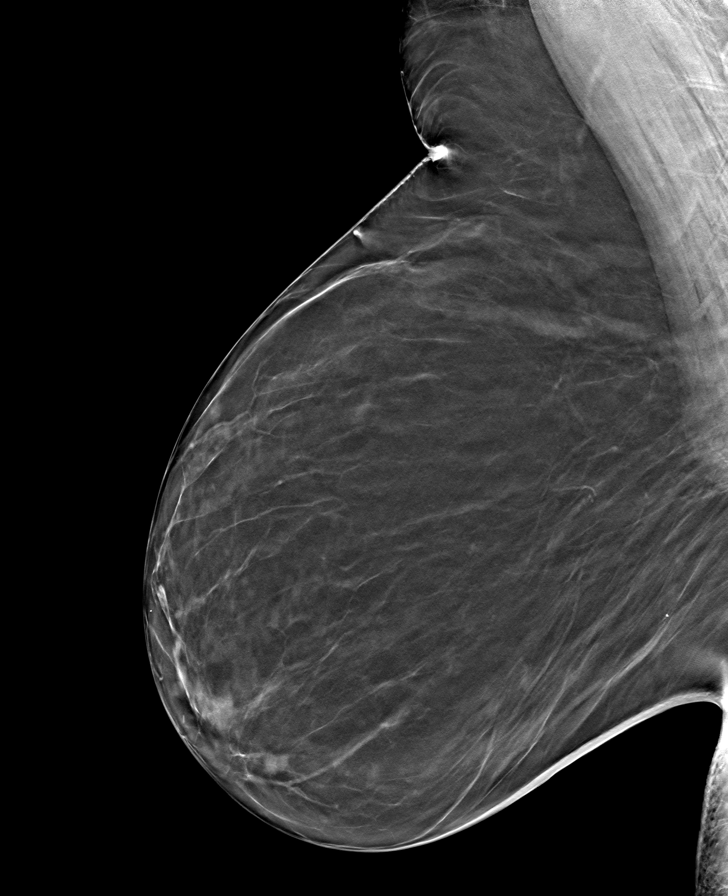

[L CC tomo · tomo slice 34/67.0]
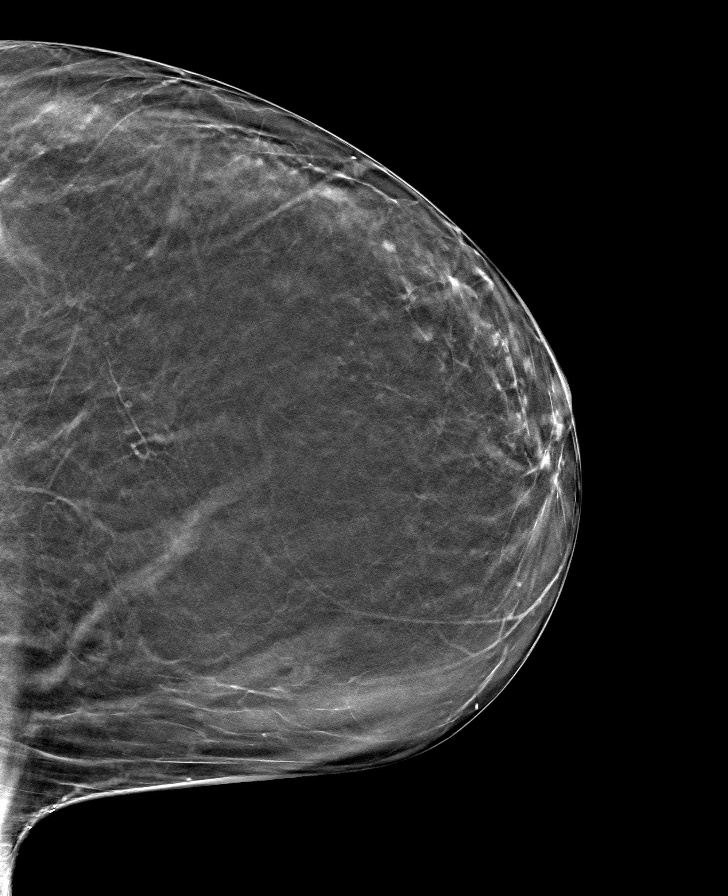

[8 of 24 positions shown; findings below may reference images not displayed]

FINDINGS: There are no findings suspicious for malignancy. Images were
processed with CAD.
IMPRESSION: No mammographic evidence of malignancy. A result letter of this
screening mammogram will be mailed directly to the patient.

RECOMMENDATION:
Screening mammogram in one year. (Code:[09])

BI-RADS CATEGORY  1: Negative.

## 2017-09-26 ENCOUNTER — Encounter: Payer: Self-pay | Admitting: Family Medicine

## 2017-09-27 ENCOUNTER — Encounter: Payer: Self-pay | Admitting: Physician Assistant

## 2017-09-27 ENCOUNTER — Ambulatory Visit (INDEPENDENT_AMBULATORY_CARE_PROVIDER_SITE_OTHER): Payer: Medicare Other | Admitting: Physician Assistant

## 2017-09-27 VITALS — BP 152/98 | HR 68 | Temp 97.8°F | Ht 66.0 in | Wt 179.0 lb

## 2017-09-27 DIAGNOSIS — R197 Diarrhea, unspecified: Secondary | ICD-10-CM

## 2017-09-27 DIAGNOSIS — R140 Abdominal distension (gaseous): Secondary | ICD-10-CM

## 2017-09-27 DIAGNOSIS — R109 Unspecified abdominal pain: Secondary | ICD-10-CM

## 2017-09-27 LAB — COMPREHENSIVE METABOLIC PANEL
ALT: 15 U/L (ref 0–35)
AST: 17 U/L (ref 0–37)
Albumin: 4.3 g/dL (ref 3.5–5.2)
Alkaline Phosphatase: 48 U/L (ref 39–117)
BILIRUBIN TOTAL: 0.9 mg/dL (ref 0.2–1.2)
BUN: 11 mg/dL (ref 6–23)
CALCIUM: 9.9 mg/dL (ref 8.4–10.5)
CHLORIDE: 107 meq/L (ref 96–112)
CO2: 27 meq/L (ref 19–32)
Creatinine, Ser: 0.99 mg/dL (ref 0.40–1.20)
GFR: 58.71 mL/min — AB (ref >60.00)
Glucose, Bld: 98 mg/dL (ref 70–99)
Potassium: 4.3 mEq/L (ref 3.5–5.1)
Sodium: 141 mEq/L (ref 135–145)
Total Protein: 7.2 g/dL (ref 6.0–8.3)

## 2017-09-27 LAB — POCT URINALYSIS DIPSTICK
Bilirubin, UA: NEGATIVE
Glucose, UA: NEGATIVE
KETONES UA: NEGATIVE
Leukocytes, UA: NEGATIVE
NITRITE UA: NEGATIVE
PH UA: 6.5 (ref 5.0–8.0)
PROTEIN UA: NEGATIVE
SPEC GRAV UA: 1.01 (ref 1.010–1.025)
UROBILINOGEN UA: 0.2 U/dL

## 2017-09-27 LAB — CBC
HCT: 42.9 % (ref 36.0–46.0)
HEMOGLOBIN: 14.9 g/dL (ref 12.0–15.0)
MCHC: 34.6 g/dL (ref 30.0–36.0)
MCV: 91.7 fl (ref 78.0–100.0)
Platelets: 248 10*3/uL (ref 150.0–400.0)
RBC: 4.67 Mil/uL (ref 3.87–5.11)
RDW: 13.3 % (ref 11.5–15.5)
WBC: 6.6 10*3/uL (ref 4.0–10.5)

## 2017-09-27 NOTE — Progress Notes (Signed)
Latasha Mclaughlin is a 72 y.o. female here for a new problem.  I acted as a Education administrator for [employer], PA-C Anselmo Pickler, LPN  History of Present Illness:   Chief Complaint  Patient presents with  . Bloated  . Abdominal Pain    epigastric    Abdominal Pain  This is a new (Pt c/o Epigastric pain x 2 weeks ) problem. The onset quality is sudden. The problem occurs intermittently. The most recent episode lasted 1 hour. The problem has been unchanged. The pain is located in the epigastric region. The pain is at a severity of 4/10. The pain is moderate. The quality of the pain is aching, dull, a sensation of fullness and cramping. The abdominal pain does not radiate. Associated symptoms include belching, diarrhea (x 2 weeks up till today no bowel movement), flatus, nausea and vomiting (Ate at [place] on Saturday and vomited when she got home). Pertinent negatives include no dysuria, fever, frequency, headaches or hematuria. The pain is aggravated by eating. The pain is relieved by belching. She has tried antacids (Nexium yesterday) for the symptoms. The treatment provided moderate relief. There is no history of gallstones, irritable bowel syndrome or ulcerative colitis. Hx of Hiatal Hernia   No recent travel, denies blood in stools. She did report that she went to [employer], had meatloaf and potatoes and vomited as soon as she got home. Had a family member who had similar food but not similar symptoms.  Gets full easily. Drink diet pepsi and sparkling water. No history of DM. Thyroid is well controlled.  She also endorses a history of hiatal hernia.  She did have a history of GERD, and this was well controlled with Prevacid, however she reports that after 2 to 3 years of taking this medication one day she broke out into a rash and had to stop taking it.  She does report that over the past few days she has had thinner than normal stools.  She denies increased belching.  She also reports that  her pain does not worsen with activity.    Past Medical History:  Diagnosis Date  . Arthritis   . Back pain   . GERD (gastroesophageal reflux disease)   . History of tonsillectomy   . Hyperlipidemia 12/15/2016  . Hypothyroidism 12/15/2016  . Osteoporosis      Social History   Socioeconomic History  . Marital status: Married    Spouse name: Not on file  . Number of children: Not on file  . Years of education: Not on file  . Highest education level: Not on file  Occupational History  . Not on file  Social Needs  . Financial resource strain: Not on file  . Food insecurity:    Worry: Not on file    Inability: Not on file  . Transportation needs:    Medical: Not on file    Non-medical: Not on file  Tobacco Use  . Smoking status: Former Research scientist (life sciences)  . Smokeless tobacco: Never Used  . Tobacco comment: Quit in 1985  Substance and Sexual Activity  . Alcohol use: No  . Drug use: No  . Sexual activity: Yes  Lifestyle  . Physical activity:    Days per week: Not on file    Minutes per session: Not on file  . Stress: Not on file  Relationships  . Social connections:    Talks on phone: Not on file    Gets together: Not on file  Attends religious service: Not on file    Active member of club or organization: Not on file    Attends meetings of clubs or organizations: Not on file    Relationship status: Not on file  . Intimate partner violence:    Fear of current or ex partner: Not on file    Emotionally abused: Not on file    Physically abused: Not on file    Forced sexual activity: Not on file  Other Topics Concern  . Not on file  Social History Narrative  . Not on file    Past Surgical History:  Procedure Laterality Date  . ADENOIDECTOMY    . ANTERIOR CERVICAL DECOMP/DISCECTOMY FUSION  2002  . DILATION AND CURETTAGE OF UTERUS     X2  . LEFT OOPHORECTOMY    . PARATHYROIDECTOMY    . SHOULDER SURGERY    . TONSILLECTOMY      Family History  Problem Relation Age  of Onset  . Hyperlipidemia Mother   . Hypertension Mother   . Congestive Heart Failure Mother   . Cancer Father   . Diabetes Father   . Heart disease Father   . Hyperlipidemia Father   . Thyroid disease Sister   . Drug abuse Brother   . Diabetes Son     Allergies  Allergen Reactions  . Prevacid [Lansoprazole] Rash    Current Medications:   Current Outpatient Medications:  .  Cholecalciferol (VITAMIN D-1000 MAX ST) 1000 units tablet, Take 2,000 Units by mouth daily., Disp: , Rfl:  .  levothyroxine (SYNTHROID) 100 MCG tablet, Take 1 tablet every third day in a 3 day cycle for 90 days.  PATIENT MUST HAVE BRAND NAME SYNTHROID, Disp: 60 tablet, Rfl: 3 .  levothyroxine (SYNTHROID) 88 MCG tablet, Take 1 tablet every first and second day in a 3 day cycle for 90 days.  MUST HAVE BRAND NAME SYNTHROID, Disp: 90 tablet, Rfl: 3 .  rosuvastatin (CRESTOR) 10 MG tablet, Take 1 tablet (10 mg total) by mouth daily., Disp: 90 tablet, Rfl: 3   Review of Systems:   Review of Systems  Constitutional: Negative for fever.  Gastrointestinal: Positive for abdominal pain, diarrhea (x 2 weeks up till today no bowel movement), flatus, nausea and vomiting (Ate at [place] on Saturday and vomited when she got home).  Genitourinary: Negative for dysuria, frequency and hematuria.  Neurological: Negative for headaches.    Vitals:   Vitals:   09/27/17 1414  BP: (!) 152/98  Pulse: 68  Temp: 97.8 F (36.6 C)  TempSrc: Oral  SpO2: 96%  Weight: 179 lb (81.2 kg)  Height: 5\' 6"  (1.676 m)     Body mass index is 28.89 kg/m.  Physical Exam:   Physical Exam  Constitutional: She appears well-developed. She is cooperative.  Non-toxic appearance. She does not have a sickly appearance. She does not appear ill. No distress.  Cardiovascular: Normal rate, regular rhythm, S1 normal, S2 normal, normal heart sounds and normal pulses.  No LE edema  Pulmonary/Chest: Effort normal and breath sounds normal.   Abdominal: Normal appearance and bowel sounds are normal. There is no tenderness. There is positive Murphy's sign (Very mild discomfort).  Neurological: She is alert. GCS eye subscore is 4. GCS verbal subscore is 5. GCS motor subscore is 6.  Skin: Skin is warm, dry and intact.  Psychiatric: She has a normal mood and affect. Her speech is normal and behavior is normal.  Nursing note and vitals reviewed.  Results  for orders placed or performed in visit on 09/27/17  POCT urinalysis dipstick  Result Value Ref Range   Color, UA Yellow    Clarity, UA Clear    Glucose, UA Negative Negative   Bilirubin, UA Negative    Ketones, UA Negative    Spec Grav, UA 1.010 1.010 - 1.025   Blood, UA Trace    pH, UA 6.5 5.0 - 8.0   Protein, UA Negative Negative   Urobilinogen, UA 0.2 0.2 or 1.0 E.U./dL   Nitrite, UA Negative    Leukocytes, UA Negative Negative   Appearance     Odor       Assessment and Plan:    [place] was seen today for bloated and abdominal pain.  Diagnoses and all orders for this visit:  Abdominal pain, unspecified abdominal location -     POCT urinalysis dipstick -     CBC -     Comprehensive metabolic panel  Diarrhea, unspecified type   Urinalysis was negative.  No red flags on exam.  Discussed starting a probiotic.  Also recommended taking Nexium daily.  If diarrhea recurs, I recommended that she keep a log of foods that may precipitated this, and switch to a bland diet.  Follow-up with [place] in 10 to 14 days for further evaluation of symptoms, sooner if needed.  At follow-up consider abdominal ultrasound or other imaging if symptoms worsen or persist despite treatment.  Patient is agreeable to plan today.  . Reviewed expectations re: course of current medical issues. . Discussed self-management of symptoms. . Outlined signs and symptoms indicating need for more acute intervention. . Patient verbalized understanding and all questions were answered. . See orders for this  visit as documented in the electronic medical record. . Patient received an After-Visit Summary.  CMA or LPN served as scribe during this visit. History, Physical, and Plan performed by medical provider. Documentation and orders reviewed and attested to.  Inda Coke, PA-C

## 2017-09-27 NOTE — Patient Instructions (Signed)
It was great to meet you!  Start a probiotic. Continue daily Nexium. Stick to bland foods if your diarrhea worsens.  Let's follow-up in 10-14 days, sooner if needed. We may need to consider additional testing or imaging at this time.

## 2017-10-07 ENCOUNTER — Ambulatory Visit (INDEPENDENT_AMBULATORY_CARE_PROVIDER_SITE_OTHER): Payer: Medicare Other | Admitting: Family Medicine

## 2017-10-07 ENCOUNTER — Encounter: Payer: Self-pay | Admitting: Family Medicine

## 2017-10-07 VITALS — BP 144/88 | HR 59 | Temp 98.1°F | Ht 66.0 in | Wt 179.4 lb

## 2017-10-07 DIAGNOSIS — L821 Other seborrheic keratosis: Secondary | ICD-10-CM | POA: Diagnosis not present

## 2017-10-07 DIAGNOSIS — R1011 Right upper quadrant pain: Secondary | ICD-10-CM

## 2017-10-07 DIAGNOSIS — L989 Disorder of the skin and subcutaneous tissue, unspecified: Secondary | ICD-10-CM | POA: Diagnosis not present

## 2017-10-07 LAB — CBC WITH DIFFERENTIAL/PLATELET
Basophils Absolute: 0 K/uL (ref 0.0–0.1)
Basophils Relative: 0.7 % (ref 0.0–3.0)
Eosinophils Absolute: 0.1 K/uL (ref 0.0–0.7)
Eosinophils Relative: 1.4 % (ref 0.0–5.0)
HCT: 44.1 % (ref 36.0–46.0)
Hemoglobin: 15 g/dL (ref 12.0–15.0)
Lymphocytes Relative: 31.4 % (ref 12.0–46.0)
Lymphs Abs: 1.8 K/uL (ref 0.7–4.0)
MCHC: 34.1 g/dL (ref 30.0–36.0)
MCV: 92.4 fl (ref 78.0–100.0)
Monocytes Absolute: 0.5 K/uL (ref 0.1–1.0)
Monocytes Relative: 8.3 % (ref 3.0–12.0)
Neutro Abs: 3.4 K/uL (ref 1.4–7.7)
Neutrophils Relative %: 58.2 % (ref 43.0–77.0)
Platelets: 264 K/uL (ref 150.0–400.0)
RBC: 4.77 Mil/uL (ref 3.87–5.11)
RDW: 13.5 % (ref 11.5–15.5)
WBC: 5.8 K/uL (ref 4.0–10.5)

## 2017-10-07 LAB — COMPREHENSIVE METABOLIC PANEL WITH GFR
ALT: 18 U/L (ref 0–35)
AST: 18 U/L (ref 0–37)
Albumin: 4.4 g/dL (ref 3.5–5.2)
Alkaline Phosphatase: 51 U/L (ref 39–117)
BUN: 8 mg/dL (ref 6–23)
CO2: 28 meq/L (ref 19–32)
Calcium: 10.1 mg/dL (ref 8.4–10.5)
Chloride: 108 meq/L (ref 96–112)
Creatinine, Ser: 0.96 mg/dL (ref 0.40–1.20)
GFR: 60.83 mL/min
Glucose, Bld: 103 mg/dL — ABNORMAL HIGH (ref 70–99)
Potassium: 4.5 meq/L (ref 3.5–5.1)
Sodium: 142 meq/L (ref 135–145)
Total Bilirubin: 1 mg/dL (ref 0.2–1.2)
Total Protein: 7 g/dL (ref 6.0–8.3)

## 2017-10-07 LAB — LIPASE: Lipase: 21 U/L (ref 11.0–59.0)

## 2017-10-07 NOTE — Progress Notes (Signed)
Latasha Mclaughlin is a 72 y.o. female is here for follow up.  History of Present Illness:   [name] CMA acting as scribe for Dr. [name].  HPI: Patient comes in today for follow up on her stomach pains. She saw Aldona Bar back in May 2019 for these symptoms. Aldona Bar told her to take Nexium and probiotic. Patient is still having some loose stools and gurgling sounds in the belly area.   Also, she has additional complaints of a skin lesion on her left temple that has darkened.   Health Maintenance Due  Topic Date Due  . TETANUS/TDAP  05/21/1965   Depression screen PHQ 2/9 12/15/2016  Decreased Interest 0  Down, Depressed, Hopeless 0  PHQ - 2 Score 0   PMHx, SurgHx, SocialHx, FamHx, Medications, and Allergies were reviewed in the Visit Navigator and updated as appropriate.   Patient Active Problem List   Diagnosis Date Noted  . History of hyperparathyroidism 01/08/2017  . Myalgia 01/05/2017  . Arthralgia 01/05/2017  . Fatigue 01/05/2017  . Paresthesia 01/05/2017  . Overweight (BMI 25.0-29.9) 12/16/2016  . Hypothyroidism 12/15/2016  . Hyperlipidemia 12/15/2016   Social History   Tobacco Use  . Smoking status: Former Research scientist (life sciences)  . Smokeless tobacco: Never Used  . Tobacco comment: Quit in 1985  Substance Use Topics  . Alcohol use: No  . Drug use: No   Current Medications and Allergies:   Current Outpatient Medications:  .  Cholecalciferol (VITAMIN D-1000 MAX ST) 1000 units tablet, Take 2,000 Units by mouth daily., Disp: , Rfl:  .  levothyroxine (SYNTHROID) 100 MCG tablet, Take 1 tablet every third day in a 3 day cycle for 90 days.  PATIENT MUST HAVE BRAND NAME SYNTHROID, Disp: 60 tablet, Rfl: 3 .  levothyroxine (SYNTHROID) 88 MCG tablet, Take 1 tablet every first and second day in a 3 day cycle for 90 days.  MUST HAVE BRAND NAME SYNTHROID, Disp: 90 tablet, Rfl: 3 .  rosuvastatin (CRESTOR) 10 MG tablet, Take 1 tablet (10 mg total) by mouth daily., Disp: 90 tablet, Rfl:  3   Allergies  Allergen Reactions  . Prevacid [Lansoprazole] Rash   Review of Systems   Pertinent items are noted in the HPI. Otherwise, ROS is negative.  Vitals:   Vitals:   10/07/17 1001  BP: (!) 144/88  Pulse: (!) 59  Temp: 98.1 F (36.7 C)  TempSrc: Oral  SpO2: 98%  Weight: 179 lb 6.4 oz (81.4 kg)  Height: 5\' 6"  (1.676 m)     Body mass index is 28.96 kg/m.  Physical Exam:   Physical Exam  Constitutional: She is oriented to person, place, and time. She appears well-developed and well-nourished. No distress.  HENT:  Head: Normocephalic and atraumatic.  Right Ear: External ear normal.  Left Ear: External ear normal.  Nose: Nose normal.  Mouth/Throat: Oropharynx is clear and moist.  Eyes: Pupils are equal, round, and reactive to light. Conjunctivae and EOM are normal.  Neck: Normal range of motion. Neck supple. No thyromegaly present.  Cardiovascular: Normal rate, regular rhythm, normal heart sounds and intact distal pulses.  Pulmonary/Chest: Effort normal and breath sounds normal.  Abdominal: Soft. Bowel sounds are normal. There is tenderness in the right upper quadrant. There is no rigidity and no rebound.  Musculoskeletal: Normal range of motion.  Lymphadenopathy:    She has no cervical adenopathy.  Neurological: She is alert and oriented to person, place, and time.  Skin: Skin is warm and dry. Capillary refill takes  less than 2 seconds.  1 cm diameter, dark, slightly irregular lesion on left temple.  Psychiatric: She has a normal mood and affect. Her behavior is normal.  Nursing note and vitals reviewed.   CT FROM 01/09/17 IMPRESSION: 1. No hydronephrosis.  No urolithiasis. 2. No evidence of bowel obstruction or acute bowel inflammation. Normal appendix. 3. Possible cholelithiasis. No evidence of acute cholecystitis. No biliary ductal dilatation. 4. Moderate hiatal hernia. 5. Mild diffuse hepatic steatosis.  Assessment and Plan:   Diagnoses and all  orders for this visit:  Colicky RUQ abdominal pain Comments: Concern for gallbladder pathology. Orders: -     CBC with Differential/Platelet -     Comprehensive metabolic panel -     Lipase -     US ABDOMEN LIMITED RUQ; Future -     Ambulatory referral to General Surgery  Skin lesion of scalp Comments: Procedure Note:   Procedure:  Skin biopsy Indication:  Changing mole   Risks including unsuccessful procedure, bleeding, infection, bruising, scar, a need for another complete procedure and others were explained to the patient in detail as well as the benefits. Informed consent was obtained and signed.   The patient was placed in a decubitus position.  Lesion #1 on left temple measuring 1 cm  Skin over lesion #1  was prepped with Betadine and alcohol and anesthetized with 1 cc of 2% lidocaine and epinephrine, using a 25-gauge 1 inch needle.  Shave biopsy with a sterile Dermablade was carried out in the usual fashion. Currette was used to destroy the rest of the lesion potentially left behind. Drysol for hemostasis. Band-Aid was applied with antibiotic ointment.  Orders: -     Dermatology pathology  . Reviewed expectations re: course of current medical issues. . Discussed self-management of symptoms. . Outlined signs and symptoms indicating need for more acute intervention. . Patient verbalized understanding and all questions were answered. Marland Kitchen Health Maintenance issues including appropriate healthy diet, exercise, and smoking avoidance were discussed with patient. . See orders for this visit as documented in the electronic medical record. . Patient received an After Visit Summary.  Briscoe Deutscher, DO [place] 10/07/2017  Future Appointments  Date Time Provider [place]  12/09/2017 10:00 AM Williemae Area, RN [facility]  12/16/2017 10:40 AM Briscoe Deutscher, DO [facility]   CMA served as scribe during this visit. History, Physical, and Plan  performed by medical provider. The above documentation has been reviewed and is accurate and complete. Briscoe Deutscher, D.O.

## 2017-10-17 ENCOUNTER — Ambulatory Visit: Payer: Medicare Other | Admitting: Family Medicine

## 2017-10-17 ENCOUNTER — Ambulatory Visit
Admission: RE | Admit: 2017-10-17 | Discharge: 2017-10-17 | Disposition: A | Discharge status: Home / Self Care | Payer: Medicare Other | Source: Ambulatory Visit | Attending: Family Medicine | Admitting: Family Medicine

## 2017-10-17 DIAGNOSIS — R1011 Right upper quadrant pain: Secondary | ICD-10-CM

## 2017-10-17 DIAGNOSIS — K7689 Other specified diseases of liver: Secondary | ICD-10-CM | POA: Diagnosis not present

## 2017-10-21 ENCOUNTER — Encounter: Payer: Self-pay | Admitting: Family Medicine

## 2017-10-24 ENCOUNTER — Other Ambulatory Visit: Payer: Self-pay

## 2017-10-24 DIAGNOSIS — R1011 Right upper quadrant pain: Secondary | ICD-10-CM

## 2017-10-24 NOTE — Progress Notes (Unsigned)
amb refer

## 2017-10-25 ENCOUNTER — Telehealth: Payer: Self-pay | Admitting: Gastroenterology

## 2017-10-25 NOTE — Telephone Encounter (Signed)
GI hx with Dr. Fuller Plan. GI records have been received and placed on Dr. Silvio Pate desk for review. Patient has moved back to the area.

## 2017-10-26 ENCOUNTER — Encounter: Payer: Self-pay | Admitting: Gastroenterology

## 2017-10-26 NOTE — Telephone Encounter (Signed)
Dr. Fuller Plan reviewed records and has accepted patient. Ok to schedule an OV with an APP. Appointment scheduled.

## 2017-11-01 ENCOUNTER — Ambulatory Visit: Payer: Medicare Other | Admitting: General Surgery

## 2017-11-01 ENCOUNTER — Encounter: Payer: Medicare Other | Admitting: General Surgery

## 2017-11-09 ENCOUNTER — Ambulatory Visit: Payer: Medicare Other | Admitting: Gastroenterology

## 2017-11-10 ENCOUNTER — Ambulatory Visit (INDEPENDENT_AMBULATORY_CARE_PROVIDER_SITE_OTHER): Payer: Medicare Other | Admitting: Gastroenterology

## 2017-11-10 ENCOUNTER — Encounter: Payer: Self-pay | Admitting: Gastroenterology

## 2017-11-10 ENCOUNTER — Ambulatory Visit: Payer: Medicare Other | Admitting: General Surgery

## 2017-11-10 VITALS — BP 100/80 | HR 70 | Ht 66.0 in | Wt 178.2 lb

## 2017-11-10 DIAGNOSIS — R1013 Epigastric pain: Secondary | ICD-10-CM | POA: Diagnosis not present

## 2017-11-10 DIAGNOSIS — R1011 Right upper quadrant pain: Secondary | ICD-10-CM

## 2017-11-10 DIAGNOSIS — Z86010 Personal history of colonic polyps: Secondary | ICD-10-CM

## 2017-11-10 DIAGNOSIS — R194 Change in bowel habit: Secondary | ICD-10-CM

## 2017-11-10 MED ORDER — NA SULFATE-K SULFATE-MG SULF 17.5-3.13-1.6 GM/177ML PO SOLN: 1.0000 | ORAL | 0 refills | Status: DC

## 2017-11-10 MED ORDER — PANTOPRAZOLE SODIUM 40 MG PO TBEC: 40.0000 mg | DELAYED_RELEASE_TABLET | Freq: Every day | ORAL | 2 refills | Status: DC

## 2017-11-10 NOTE — Patient Instructions (Signed)
We have sent the following medications to your pharmacy for you to pick up at your convenience: Pantoprazole 40 mg daily  Benefiber or Citrucel powder once daily.   You have been scheduled for an endoscopy and colonoscopy. Please follow the written instructions given to you at your visit today. Please pick up your prep supplies at the pharmacy within the next 1-3 days. If you use inhalers (even only as needed), please bring them with you on the day of your procedure. Your physician has requested that you go to www.startemmi.com and enter the access code given to you at your visit today. This web site gives a general overview about your procedure. However, you should still follow specific instructions given to you by our office regarding your preparation for the procedure.

## 2017-11-10 NOTE — Progress Notes (Signed)
11/10/2017 Latasha Mclaughlin [id number] [birth year]   HISTORY OF PRESENT ILLNESS:  This is a 72 year old female who Dr. Fuller Plan has accepted as a patient and reviewed her records.  She moved here from [facility]  Has been referred here by her PCP, Dr. Briscoe Deutscher, for evaluation regarding some GI complaints including upper abdominal pain and change in bowel habits.  Tells me that sometimes her stools are mushy and has about 3-4 BM's daily.  A lot of times she gets very bloated when she eats.  Has upper abdominal pain that comes and goes and has been present since about May.  Says that she actually felt pretty good yesterday.  Has tried short courses of OTC Nexium and probiotic.  Denies seeing blood in her stools or black stools.  CBC, CMP, and lipase WNL's.  CT abdomen and pelvis without contrast in 01/2017 showed the following:  IMPRESSION: 1. No hydronephrosis.  No urolithiasis. 2. No evidence of bowel obstruction or acute bowel inflammation.  Normal appendix. 3. Possible cholelithiasis. No evidence of acute cholecystitis. No biliary ductal dilatation. 4. Moderate hiatal hernia. 5. Mild diffuse hepatic steatosis. 6. Soft tissue mass posterior to the bladder that morphologically resembles a grossly normal uterus in this patient with an electronic medical history of hysterectomy (per epic). Correlation with accurate gynecologic surgical history is necessary.  Ultrasound of the RUQ last month showed probably fatty liver and small left hepatic cyst that was slightly increased in size since 2018.  Colonoscopy 08/2013 showed a sessile polyp in the descending colon that was 15 mm in size and non-bleeding internal hemorrhoids.  This was an adenomatous polyp.   Past Medical History:  Diagnosis Date  . Arthritis   . Back pain   . GERD (gastroesophageal reflux disease)   . History of tonsillectomy   . Hyperlipidemia 12/15/2016  . Hypothyroidism 12/15/2016  . Osteoporosis    Past Surgical  History:  Procedure Laterality Date  . ADENOIDECTOMY    . ANTERIOR CERVICAL DECOMP/DISCECTOMY FUSION  2002  . DILATION AND CURETTAGE OF UTERUS     X2  . LEFT OOPHORECTOMY    . PARATHYROIDECTOMY    . SHOULDER SURGERY    . TONSILLECTOMY      reports that she has quit smoking. She has never used smokeless tobacco. She reports that she does not drink alcohol or use drugs. family history includes Cancer in her father; Congestive Heart Failure in her mother; Diabetes in her father and son; Drug abuse in her brother; Heart disease in her father; Hyperlipidemia in her father and mother; Hypertension in her mother; Thyroid disease in her sister. Allergies  Allergen Reactions  . Prevacid [Lansoprazole] Rash     Outpatient Encounter Medications as of 11/10/2017  Medication Sig  . Cholecalciferol (VITAMIN D-1000 MAX ST) 1000 units tablet Take 2,000 Units by mouth daily.  Marland Kitchen levothyroxine (SYNTHROID) 100 MCG tablet Take 1 tablet every third day in a 3 day cycle for 90 days.  PATIENT MUST HAVE BRAND NAME SYNTHROID  . levothyroxine (SYNTHROID) 88 MCG tablet Take 1 tablet every first and second day in a 3 day cycle for 90 days.  MUST HAVE BRAND NAME SYNTHROID  . rosuvastatin (CRESTOR) 10 MG tablet Take 1 tablet (10 mg total) by mouth daily.   No facility-administered encounter medications on file as of 11/10/2017.      REVIEW OF SYSTEMS  : All other systems reviewed and negative except where noted in the History of  Present Illness.   PHYSICAL EXAM: BP 100/80   Pulse 70   Ht 5\' 6"  (1.676 m)   Wt 178 lb 3.2 oz (80.8 kg)   SpO2 98%   BMI 28.76 kg/m  General: Well developed white female in no acute distress Head: Normocephalic and atraumatic Eyes:  Sclerae anicteric, conjunctiva pink. Ears: Normal auditory acuity Lungs: Clear throughout to auscultation; no increased WOB. Heart: Regular rate and rhythm; no M/R/G. Abdomen: Soft, non-distended.  BS present.  Non-tender. Rectal:  Will be done at  the time of colonoscopy. Musculoskeletal: Symmetrical with no gross deformities  Skin: No lesions on visible extremities Extremities: No edema  Neurological: Alert oriented x 4, grossly non-focal Psychological:  Alert and cooperative. Normal mood and affect  ASSESSMENT AND PLAN: *Epigastric/RUQ abdominal pain:  Comes and goes but present most days.  Does not necessarily sound biliary in origin, but still possible.  Will start on pantoprazole 40 mg daily and schedule for EGD with Dr. Fuller Plan. *Personal history of colon polyps:  Was due 08/2016 due to large adenomatous polyp in 08/2013.  Will schedule for colonoscopy with Dr. Fuller Plan. *Change in bowel habits:  Mushy stools, sometimes 3-4 times per day.  Getting colonoscopy and will start daily powder fiber supplement such as Benefiber or Citrucel.  **The risks, benefits, and alternatives to EGD and colonoscopy were discussed with the patient and she consents to proceed.   CC:  Briscoe Deutscher, DO

## 2017-11-18 ENCOUNTER — Encounter: Payer: Self-pay | Admitting: Gastroenterology

## 2017-11-18 DIAGNOSIS — K635 Polyp of colon: Secondary | ICD-10-CM | POA: Insufficient documentation

## 2017-11-18 DIAGNOSIS — R194 Change in bowel habit: Secondary | ICD-10-CM | POA: Insufficient documentation

## 2017-11-18 DIAGNOSIS — R1013 Epigastric pain: Secondary | ICD-10-CM | POA: Insufficient documentation

## 2017-11-18 DIAGNOSIS — Z86010 Personal history of colonic polyps: Secondary | ICD-10-CM | POA: Insufficient documentation

## 2017-11-20 NOTE — Progress Notes (Signed)
Reviewed and agree with initial management plan.  Malcolm T. Stark, MD FACG 

## 2017-12-08 ENCOUNTER — Ambulatory Visit: Payer: Medicare Other | Admitting: *Deleted

## 2017-12-09 ENCOUNTER — Ambulatory Visit: Payer: Medicare Other

## 2017-12-14 ENCOUNTER — Ambulatory Visit: Payer: Medicare Other

## 2017-12-14 NOTE — Progress Notes (Signed)
Latasha Mclaughlin is a 72 y.o. female is here for follow up.  History of Present Illness:   [name] CMA acting as scribe for Dr. [name].  HPI: Patient comes in today for her six month follow up. Patient stated that she is having some pressure in her ears. She stated that she feels like she has some sinus pressure.   Health Maintenance Due  Topic Date Due  . TETANUS/TDAP  05/21/1965  . INFLUENZA VACCINE  12/01/2017   Depression screen [facility] 2/9 12/16/2017 12/15/2016  Decreased Interest 0 0  Down, Depressed, Hopeless 0 0  PHQ - 2 Score 0 0   PMHx, SurgHx, SocialHx, FamHx, Medications, and Allergies were reviewed in the Visit Navigator and updated as appropriate.   Patient Active Problem List   Diagnosis Date Noted  . History of colonic polyps 11/18/2017  . Change in bowel habits 11/18/2017  . Abdominal pain, epigastric 11/18/2017  . History of hyperparathyroidism 01/08/2017  . Myalgia 01/05/2017  . Arthralgia 01/05/2017  . Fatigue 01/05/2017  . Paresthesia 01/05/2017  . Overweight (BMI 25.0-29.9) 12/16/2016  . Hypothyroidism 12/15/2016  . Hyperlipidemia 12/15/2016   Social History   Tobacco Use  . Smoking status: Former Research scientist (life sciences)  . Smokeless tobacco: Never Used  . Tobacco comment: Quit in 1985  Substance Use Topics  . Alcohol use: No  . Drug use: No   Current Medications and Allergies:   .  Cholecalciferol (VITAMIN D-1000 MAX ST) 1000 units tablet, Take 2,000 Units by mouth daily., Disp: , Rfl:  .  levothyroxine (SYNTHROID) 100 MCG tablet, Take 1 tablet every third day in a 3 day cycle for 90 days.  PATIENT MUST HAVE BRAND NAME SYNTHROID, Disp: 60 tablet, Rfl: 3 .  levothyroxine (SYNTHROID) 88 MCG tablet, Take 1 tablet every first and second day in a 3 day cycle for 90 days.  MUST HAVE BRAND NAME SYNTHROID, Disp: 90 tablet, Rfl: 3 .  Na Sulfate-K Sulfate-Mg Sulf (SUPREP BOWEL PREP KIT) 17.5-3.13-1.6 GM/177ML SOLN, Take 1 kit by mouth as directed., Disp: 324 mL, Rfl:  0 .  pantoprazole (PROTONIX) 40 MG tablet, Take 1 tablet (40 mg total) by mouth daily., Disp: 30 tablet, Rfl: 2 .  rosuvastatin (CRESTOR) 10 MG tablet, Take 1 tablet (10 mg total) by mouth daily., Disp: 90 tablet, Rfl: 3 .  amoxicillin (AMOXIL) 875 MG tablet, Take 1 tablet (875 mg total) by mouth 2 (two) times daily., Disp: 20 tablet, Rfl: 0   Allergies  Allergen Reactions  . Prevacid [Lansoprazole] Rash   Review of Systems   Pertinent items are noted in the HPI. Otherwise, ROS is negative.  Vitals:   Vitals:   12/16/17 1022  BP: 130/84  Pulse: 62  Temp: 97.9 F (36.6 C)  TempSrc: Oral  SpO2: 97%  Weight: 180 lb 9.6 oz (81.9 kg)  Height: 5' 6"  (1.676 m)     Body mass index is 29.15 kg/m.  Physical Exam:   Physical Exam  Constitutional: She appears well-nourished.  HENT:  Head: Normocephalic and atraumatic.  Eyes: Pupils are equal, round, and reactive to light. EOM are normal.  Neck: Normal range of motion. Neck supple.  Cardiovascular: Normal rate, regular rhythm, normal heart sounds and intact distal pulses.  Pulmonary/Chest: Effort normal.  Abdominal: Soft.  Skin: Skin is warm.  Psychiatric: She has a normal mood and affect. Her behavior is normal.  Nursing note and vitals reviewed.  Assessment and Plan:   Diagnoses and all orders for this  visit:  Subacute maxillary sinusitis -     amoxicillin (AMOXIL) 875 MG tablet; Take 1 tablet (875 mg total) by mouth 2 (two) times daily.  Mixed hyperlipidemia Comments: Due for recheck of labs.  Tinnitus of both ears Comments: Hearing screen normal.    . Reviewed expectations re: course of current medical issues. . Discussed self-management of symptoms. . Outlined signs and symptoms indicating need for more acute intervention. . Patient verbalized understanding and all questions were answered. Marland Kitchen Health Maintenance issues including appropriate healthy diet, exercise, and smoking avoidance were discussed with  patient. . See orders for this visit as documented in the electronic medical record. . Patient received an After Visit Summary.  CMA served as Education administrator during this visit. History, Physical, and Plan performed by medical provider. The above documentation has been reviewed and is accurate and complete. Briscoe Deutscher, D.O.  Briscoe Deutscher, DO [place] 12/16/2017

## 2017-12-16 ENCOUNTER — Encounter: Payer: Self-pay | Admitting: Family Medicine

## 2017-12-16 ENCOUNTER — Ambulatory Visit (INDEPENDENT_AMBULATORY_CARE_PROVIDER_SITE_OTHER): Payer: Medicare Other | Admitting: Family Medicine

## 2017-12-16 VITALS — BP 130/84 | HR 62 | Temp 97.9°F | Ht 66.0 in | Wt 180.6 lb

## 2017-12-16 DIAGNOSIS — H9313 Tinnitus, bilateral: Secondary | ICD-10-CM | POA: Diagnosis not present

## 2017-12-16 DIAGNOSIS — E782 Mixed hyperlipidemia: Secondary | ICD-10-CM | POA: Diagnosis not present

## 2017-12-16 DIAGNOSIS — J0100 Acute maxillary sinusitis, unspecified: Secondary | ICD-10-CM | POA: Diagnosis not present

## 2017-12-16 LAB — COMPREHENSIVE METABOLIC PANEL
ALT: 19 U/L (ref 0–35)
AST: 19 U/L (ref 0–37)
Albumin: 4.5 g/dL (ref 3.5–5.2)
Alkaline Phosphatase: 46 U/L (ref 39–117)
BUN: 11 mg/dL (ref 6–23)
CO2: 29 mEq/L (ref 19–32)
Calcium: 10.4 mg/dL (ref 8.4–10.5)
Chloride: 109 mEq/L (ref 96–112)
Creatinine, Ser: 1.1 mg/dL (ref 0.40–1.20)
GFR: 51.96 mL/min — ABNORMAL LOW (ref >60.00)
Glucose, Bld: 103 mg/dL — ABNORMAL HIGH (ref 70–99)
Potassium: 4.6 mEq/L (ref 3.5–5.1)
Sodium: 143 mEq/L (ref 135–145)
Total Bilirubin: 1 mg/dL (ref 0.2–1.2)
Total Protein: 7.1 g/dL (ref 6.0–8.3)

## 2017-12-16 LAB — LIPID PANEL
Cholesterol: 167 mg/dL (ref 0–200)
HDL: 49.6 mg/dL (ref >39.00)
LDL Cholesterol: 90 mg/dL (ref 0–99)
NonHDL: 117.06
Total CHOL/HDL Ratio: 3
Triglycerides: 137 mg/dL (ref 0.0–149.0)
VLDL: 27.4 mg/dL (ref 0.0–40.0)

## 2017-12-16 MED ORDER — AMOXICILLIN 875 MG PO TABS: 875.0000 mg | ORAL_TABLET | Freq: Two times a day (BID) | ORAL | 0 refills | Status: DC

## 2017-12-21 ENCOUNTER — Encounter: Payer: Self-pay | Admitting: Family Medicine

## 2017-12-22 ENCOUNTER — Ambulatory Visit (INDEPENDENT_AMBULATORY_CARE_PROVIDER_SITE_OTHER): Payer: Medicare Other | Admitting: Family Medicine

## 2017-12-22 ENCOUNTER — Encounter: Payer: Self-pay | Admitting: Family Medicine

## 2017-12-22 VITALS — BP 110/76 | HR 64 | Temp 98.1°F | Ht 66.0 in | Wt 179.8 lb

## 2017-12-22 DIAGNOSIS — J0100 Acute maxillary sinusitis, unspecified: Secondary | ICD-10-CM

## 2017-12-22 MED ORDER — FLUTICASONE PROPIONATE 50 MCG/ACT NA SUSP: 2.0000 | Freq: Every day | NASAL | 2 refills | Status: DC

## 2017-12-22 MED ORDER — BETAMETHASONE SOD PHOS & ACET 6 (3-3) MG/ML IJ SUSP
12.0000 mg | Freq: Once | INTRAMUSCULAR | Status: AC
  Administered 2017-12-22: 12 mg via INTRAMUSCULAR

## 2017-12-22 NOTE — Telephone Encounter (Signed)
Pt returned call. Scheduled pt today with Orma Flaming

## 2017-12-22 NOTE — Telephone Encounter (Signed)
Called patient to get more information. EW is out of office today but want to see if she needs to see someone.

## 2017-12-22 NOTE — Patient Instructions (Signed)

## 2017-12-22 NOTE — Progress Notes (Signed)
Patient: Latasha Mclaughlin MRN: [id number] DOB: July 24, 1945 PCP: Briscoe Deutscher, DO     Subjective:  Chief Complaint  Patient presents with  . Ear Pain  . Facial Pain  . Sore Throat    HPI: The patient is a 72 y.o. female who presents today for ear pain, sinus pain and pressure and slight sore throat. She was seen by her PCP on 8/16 and was diagnosed as maxillary sinusitis. She was given amoxicillin 875mg  BID. She is still taking this and is on her 7th day. She also has been using azelastine nasal spray. Her symptoms have progressively gotten worse. She states the pressure in her ears is unbearable. The pressure in her sinuses and eyes is also bad. It feels stuck and doesn't want to move. She doesn't have much of anything coming out of her nose. No fever/chills. No cough or shortness of breath. No drainage in her ears. Does have baseline tinnitus.   Review of Systems  Constitutional: Positive for appetite change. Negative for chills and fever.  HENT: Positive for congestion, ear pain, rhinorrhea, sinus pressure and sore throat.   Respiratory: Negative for cough and shortness of breath.   Cardiovascular: Negative for chest pain.  Gastrointestinal: Negative for abdominal pain and nausea.  Musculoskeletal: Negative for neck pain.  Neurological: Positive for dizziness and headaches.    Allergies Patient is allergic to prevacid [lansoprazole].  Past Medical History Patient  has a past medical history of Arthritis, Back pain, GERD (gastroesophageal reflux disease), History of tonsillectomy, Hyperlipidemia (12/15/2016), Hypothyroidism (12/15/2016), and Osteoporosis.  Surgical History Patient  has a past surgical history that includes Anterior cervical decomp/discectomy fusion (2002); Left oophorectomy; Tonsillectomy; Parathyroidectomy; Adenoidectomy; Dilation and curettage of uterus; and Shoulder surgery.  Family History Pateint's family history includes Cancer in her father; Congestive Heart  Failure in her mother; Diabetes in her father and son; Drug abuse in her brother; Heart disease in her father; Hyperlipidemia in her father and mother; Hypertension in her mother; Thyroid disease in her sister.  Social History Patient  reports that she has quit smoking. She has never used smokeless tobacco. She reports that she does not drink alcohol or use drugs.    Objective: Vitals:   12/22/17 1140  BP: 110/76  Pulse: 64  Temp: 98.1 F (36.7 C)  TempSrc: Oral  SpO2: 96%  Weight: 179 lb 12.8 oz (81.6 kg)  Height: 5\' 6"  (1.676 m)    Body mass index is 29.02 kg/m.  Physical Exam  Constitutional: She appears well-developed and well-nourished.  HENT:  Right Ear: Tympanic membrane and external ear normal.  Left Ear: Tympanic membrane and external ear normal.  Mouth/Throat: Oropharynx is clear and moist.  Air fluid levels bilateral TM, but TM pearly with light reflex bilaterally. No erythema, retraction or bulge.  TTP over bilateral maxillary sinuses  Cardiovascular: Normal rate, regular rhythm and normal heart sounds.  Pulmonary/Chest: Effort normal and breath sounds normal.  Abdominal: Soft. Bowel sounds are normal.  Lymphadenopathy:    She has no cervical adenopathy.  Vitals reviewed.      Assessment/plan: 1. Subacute maxillary sinusitis -will do steroid shot today. No steroid in past 3 months. Side effects discussed. Will have her stop current nasal spray and do flonase bid for a few days. Cool mist humidifier at night, warm compresses and massage over sinuses. Hopefully this will give her some relief. Continue amoxil for next 3 days. Let pcp know if not improving.  - betamethasone acetate-betamethasone sodium phosphate (CELESTONE) injection 12  mg      Return if symptoms worsen or fail to improve.     Orma Flaming, MD [place]   12/22/2017

## 2018-01-06 ENCOUNTER — Telehealth: Payer: Self-pay | Admitting: Gastroenterology

## 2018-01-06 NOTE — Telephone Encounter (Signed)
Patient notified that she can keep the appt for the procedure on Tuesday.  She is advised to let us know if she has fever or other new symptoms.

## 2018-01-10 ENCOUNTER — Ambulatory Visit (AMBULATORY_SURGERY_CENTER): Payer: Medicare Other | Admitting: Gastroenterology

## 2018-01-10 ENCOUNTER — Encounter: Payer: Self-pay | Admitting: Gastroenterology

## 2018-01-10 VITALS — BP 107/62 | HR 61 | Temp 98.7°F | Resp 11 | Ht 66.0 in | Wt 178.0 lb

## 2018-01-10 DIAGNOSIS — Z1211 Encounter for screening for malignant neoplasm of colon: Secondary | ICD-10-CM | POA: Diagnosis not present

## 2018-01-10 DIAGNOSIS — D122 Benign neoplasm of ascending colon: Secondary | ICD-10-CM | POA: Diagnosis not present

## 2018-01-10 DIAGNOSIS — R1013 Epigastric pain: Secondary | ICD-10-CM | POA: Diagnosis not present

## 2018-01-10 DIAGNOSIS — Z86010 Personal history of colonic polyps: Secondary | ICD-10-CM | POA: Diagnosis not present

## 2018-01-10 DIAGNOSIS — R194 Change in bowel habit: Secondary | ICD-10-CM | POA: Diagnosis not present

## 2018-01-10 MED ORDER — SODIUM CHLORIDE 0.9 % IV SOLN: 500.0000 mL | Freq: Once | INTRAVENOUS | Status: DC

## 2018-01-10 NOTE — Patient Instructions (Signed)
INFORMATION ON HIATAL HERNIA AND ANTI-REFLUX MEASURES GIVEN TO YOU TODAY(ORANGE SHEET)  INFORMATION ON POLYPS AND HEMORRHOIDS GIVEN TO YOU TODAY  AWAIT PATHOLOGY RESULTS    YOU HAD AN ENDOSCOPIC PROCEDURE TODAY AT [place]:   Refer to the procedure report that was given to you for any specific questions about what was found during the examination.  If the procedure report does not answer your questions, please call your gastroenterologist to clarify.  If you requested that your care partner not be given the details of your procedure findings, then the procedure report has been included in a sealed envelope for you to review at your convenience later.  YOU SHOULD EXPECT: Some feelings of bloating in the abdomen. Passage of more gas than usual.  Walking can help get rid of the air that was put into your GI tract during the procedure and reduce the bloating. If you had a lower endoscopy (such as a colonoscopy or flexible sigmoidoscopy) you may notice spotting of blood in your stool or on the toilet paper. If you underwent a bowel prep for your procedure, you may not have a normal bowel movement for a few days.  Please Note:  You might notice some irritation and congestion in your nose or some drainage.  This is from the oxygen used during your procedure.  There is no need for concern and it should clear up in a day or so.  SYMPTOMS TO REPORT IMMEDIATELY:   Following lower endoscopy (colonoscopy or flexible sigmoidoscopy):  Excessive amounts of blood in the stool  Significant tenderness or worsening of abdominal pains  Swelling of the abdomen that is new, acute  Fever of 100F or higher   Following upper endoscopy (EGD)  Vomiting of blood or coffee ground material  New chest pain or pain under the shoulder blades  Painful or persistently difficult swallowing  New shortness of breath  Fever of 100F or higher  Black, tarry-looking stools  For urgent or emergent  issues, a gastroenterologist can be reached at any hour by calling [phone].   DIET:  We do recommend a small meal at first, but then you may proceed to your regular diet.  Drink plenty of fluids but you should avoid alcoholic beverages for 24 hours.  ACTIVITY:  You should plan to take it easy for the rest of today and you should NOT DRIVE or use heavy machinery until tomorrow (because of the sedation medicines used during the test).    FOLLOW UP: Our staff will call the number listed on your records the next business day following your procedure to check on you and address any questions or concerns that you may have regarding the information given to you following your procedure. If we do not reach you, we will leave a message.  However, if you are feeling well and you are not experiencing any problems, there is no need to return our call.  We will assume that you have returned to your regular daily activities without incident.  If any biopsies were taken you will be contacted by phone or by letter within the next 1-3 weeks.  Please call us at [phone] if you have not heard about the biopsies in 3 weeks.    SIGNATURES/CONFIDENTIALITY: You and/or your care partner have signed paperwork which will be entered into your electronic medical record.  These signatures attest to the fact that that the information above on your After Visit Summary has been reviewed and  is understood.  Full responsibility of the confidentiality of this discharge information lies with you and/or your care-partner.

## 2018-01-10 NOTE — Op Note (Signed)
[facility] Patient Name: Latasha Mclaughlin Procedure Date: 01/10/2018 2:15 PM MRN: [id number] Endoscopist: Ladene Artist , MD Age: 72 Referring MD:  Date of Birth: Jan 20, 1946 Gender: Female Account #: [id number] Procedure:                Upper GI endoscopy Indications:              Epigastric abdominal pain Medicines:                Monitored Anesthesia Care Procedure:                Pre-Anesthesia Assessment:                           - Prior to the procedure, a History and Physical                            was performed, and patient medications and                            allergies were reviewed. The patient's tolerance of                            previous anesthesia was also reviewed. The risks                            and benefits of the procedure and the sedation                            options and risks were discussed with the patient.                            All questions were answered, and informed consent                            was obtained. Prior Anticoagulants: The patient has                            taken no previous anticoagulant or antiplatelet                            agents. ASA Grade Assessment: II - A patient with                            mild systemic disease. After reviewing the risks                            and benefits, the patient was deemed in                            satisfactory condition to undergo the procedure.                           After obtaining informed consent, the endoscope was  passed under direct vision. Throughout the                            procedure, the patient's blood pressure, pulse, and                            oxygen saturations were monitored continuously. The                            Model GIF-HQ190 [id number]) scope was introduced                            through the mouth, and advanced to the second part                            of duodenum. The upper GI  endoscopy was                            accomplished without difficulty. The patient                            tolerated the procedure well. Scope In: Scope Out: Findings:                 One benign-appearing, intrinsic mild stenosis was                            found at the gastroesophageal junction. This                            stenosis measured 1.3 cm (inner diameter). The                            stenosis was traversed.                           The exam of the esophagus was otherwise normal.                           A medium-sized hiatal hernia was present.                           The exam of the stomach was otherwise normal.                           The duodenal bulb and second portion of the                            duodenum were normal. Complications:            No immediate complications. Estimated Blood Loss:     Estimated blood loss: none. Impression:               - Benign-appearing esophageal stenosis.                           -  Medium-sized hiatal hernia.                           - Normal duodenal bulb and second portion of the                            duodenum.                           - No specimens collected. Recommendation:           - Patient has a contact number available for                            emergencies. The signs and symptoms of potential                            delayed complications were discussed with the                            patient. Return to normal activities tomorrow.                            Written discharge instructions were provided to the                            patient.                           - Resume previous diet.                           - Antireflux measures.                           - Continue present medications. Ladene Artist, MD 01/10/2018 2:50:47 PM This report has been signed electronically.

## 2018-01-10 NOTE — Progress Notes (Signed)
A and O x3. Report to RN. Tolerated MAC anesthesia well.Teeth unchanged after procedure.

## 2018-01-10 NOTE — Op Note (Signed)
[facility] Patient Name: Latasha Mclaughlin Procedure Date: 01/10/2018 2:15 PM MRN: [id number] Endoscopist: Ladene Artist , MD Age: 72 Referring MD:  Date of Birth: 1946-04-15 Gender: Female Account #: [id number] Procedure:                Colonoscopy Indications:              Surveillance: Personal history of adenomatous                            polyps on last colonoscopy 5 years ago Medicines:                Monitored Anesthesia Care Procedure:                Pre-Anesthesia Assessment:                           - Prior to the procedure, a History and Physical                            was performed, and patient medications and                            allergies were reviewed. The patient's tolerance of                            previous anesthesia was also reviewed. The risks                            and benefits of the procedure and the sedation                            options and risks were discussed with the patient.                            All questions were answered, and informed consent                            was obtained. Prior Anticoagulants: The patient has                            taken no previous anticoagulant or antiplatelet                            agents. ASA Grade Assessment: II - A patient with                            mild systemic disease. After reviewing the risks                            and benefits, the patient was deemed in                            satisfactory condition to undergo the procedure.  After obtaining informed consent, the colonoscope                            was passed under direct vision. Throughout the                            procedure, the patient's blood pressure, pulse, and                            oxygen saturations were monitored continuously. The                            Colonoscope was introduced through the anus and                            advanced to the the cecum,  identified by                            appendiceal orifice and ileocecal valve. The                            ileocecal valve, appendiceal orifice, and rectum                            were photographed. The quality of the bowel                            preparation was good. The colonoscopy was performed                            without difficulty. The patient tolerated the                            procedure well. Scope In: 2:21:50 PM Scope Out: 2:36:27 PM Scope Withdrawal Time: 0 hours 10 minutes 53 seconds  Total Procedure Duration: 0 hours 14 minutes 37 seconds  Findings:                 The perianal and digital rectal examinations were                            normal.                           Two sessile polyps were found in the ascending                            colon. The polyps were 6 to 7 mm in size. These                            polyps were removed with a cold snare. Resection                            and retrieval were complete.  Two sessile polyps were found in the ascending                            colon. The polyps were 4 to 5 mm in size. These                            polyps were removed with a cold biopsy forceps.                            Resection and retrieval were complete.                           There was a medium-sized lipoma, 12 mm in diameter,                            in the ascending colon.                           A single small localized angiodysplastic lesion                            without bleeding was found in the ascending colon.                           External hemorrhoids were found during                            retroflexion. The hemorrhoids were small (external                            hemorrhoids).                           The exam was otherwise without abnormality on                            direct and retroflexion views. Complications:            No immediate complications.  Estimated blood loss:                            None. Estimated Blood Loss:     Estimated blood loss: none. Impression:               - Two 6 to 7 mm polyps in the ascending colon,                            removed with a cold snare. Resected and retrieved.                           - Two 4 to 5 mm polyps in the ascending colon,                            removed with a cold biopsy forceps. Resected and  retrieved.                           - Lipoma in ascending colon.                           - A single non-bleeding colonic angiodysplastic                            lesion.                           - External hemorrhoids.                           - The examination was otherwise normal on direct                            and retroflexion views. Recommendation:           - Repeat colonoscopy in 3 - 5 years for                            surveillance pending pathology review.                           - Patient has a contact number available for                            emergencies. The signs and symptoms of potential                            delayed complications were discussed with the                            patient. Return to normal activities tomorrow.                            Written discharge instructions were provided to the                            patient.                           - Resume previous diet.                           - Continue present medications.                           - Await pathology results. Ladene Artist, MD 01/10/2018 2:46:30 PM This report has been signed electronically.

## 2018-01-10 NOTE — Progress Notes (Signed)
Called to room to assist during endoscopic procedure.  Patient ID and intended procedure confirmed with present staff. Received instructions for my participation in the procedure from the performing physician.  

## 2018-01-11 ENCOUNTER — Telehealth: Payer: Self-pay

## 2018-01-11 NOTE — Telephone Encounter (Signed)
  Follow up Call-  Call back number 01/10/2018  Post procedure Call Back phone  # [phone]  Permission to leave phone message Yes  Some recent data might be hidden     Patient questions:  Do you have a fever, pain , or abdominal swelling? No. Pain Score  0 *  Have you tolerated food without any problems? Yes.    Have you been able to return to your normal activities? Yes.    Do you have any questions about your discharge instructions: Diet   No. Medications  No. Follow up visit  No.  Do you have questions or concerns about your Care? No.  Actions: * If pain score is 4 or above: No action needed, pain <4.

## 2018-01-23 ENCOUNTER — Encounter: Payer: Self-pay | Admitting: Gastroenterology

## 2018-01-27 DIAGNOSIS — Z23 Encounter for immunization: Secondary | ICD-10-CM | POA: Diagnosis not present

## 2018-02-05 ENCOUNTER — Other Ambulatory Visit: Payer: Self-pay | Admitting: Gastroenterology

## 2018-03-01 ENCOUNTER — Other Ambulatory Visit: Payer: Self-pay | Admitting: Family Medicine

## 2018-06-10 DIAGNOSIS — H6503 Acute serous otitis media, bilateral: Secondary | ICD-10-CM | POA: Diagnosis not present

## 2018-06-10 DIAGNOSIS — J029 Acute pharyngitis, unspecified: Secondary | ICD-10-CM | POA: Diagnosis not present

## 2018-06-14 ENCOUNTER — Other Ambulatory Visit: Payer: Self-pay | Admitting: Family Medicine

## 2018-06-14 DIAGNOSIS — E782 Mixed hyperlipidemia: Secondary | ICD-10-CM

## 2018-07-04 DIAGNOSIS — M722 Plantar fascial fibromatosis: Secondary | ICD-10-CM | POA: Diagnosis not present

## 2018-07-04 DIAGNOSIS — Q828 Other specified congenital malformations of skin: Secondary | ICD-10-CM | POA: Diagnosis not present

## 2018-07-04 DIAGNOSIS — M79671 Pain in right foot: Secondary | ICD-10-CM | POA: Diagnosis not present

## 2018-07-23 ENCOUNTER — Telehealth: Payer: Medicare Other | Admitting: Nurse Practitioner

## 2018-07-23 DIAGNOSIS — J0101 Acute recurrent maxillary sinusitis: Secondary | ICD-10-CM

## 2018-07-23 MED ORDER — AMOXICILLIN-POT CLAVULANATE 875-125 MG PO TABS: 1.0000 | ORAL_TABLET | Freq: Two times a day (BID) | ORAL | 0 refills | Status: DC

## 2018-07-23 NOTE — Progress Notes (Signed)
We are sorry that you are not feeling well.  Here is how we plan to help!  Based on what you have shared with me it looks like you have sinusitis.  Sinusitis is inflammation and infection in the sinus cavities of the head.  Based on your presentation I believe you most likely have Acute Bacterial Sinusitis.  This is an infection caused by bacteria and is treated with antibiotics. I have prescribed Augmentin 875mg/125mg one tablet twice daily with food, for 7 days. You may use an oral decongestant such as Mucinex D or if you have glaucoma or high blood pressure use plain Mucinex. Saline nasal spray help and can safely be used as often as needed for congestion.  If you develop worsening sinus pain, fever or notice severe headache and vision changes, or if symptoms are not better after completion of antibiotic, please schedule an appointment with a health care provider.    Sinus infections are not as easily transmitted as other respiratory infection, however we still recommend that you avoid close contact with loved ones, especially the very young and elderly.  Remember to wash your hands thoroughly throughout the day as this is the number one way to prevent the spread of infection!  Home Care:  Only take medications as instructed by your medical team.  Complete the entire course of an antibiotic.  Do not take these medications with alcohol.  A steam or ultrasonic humidifier can help congestion.  You can place a towel over your head and breathe in the steam from hot water coming from a faucet.  Avoid close contacts especially the very young and the elderly.  Cover your mouth when you cough or sneeze.  Always remember to wash your hands.  Get Help Right Away If:  You develop worsening fever or sinus pain.  You develop a severe head ache or visual changes.  Your symptoms persist after you have completed your treatment plan.  Make sure you  Understand these instructions.  Will watch your  condition.  Will get help right away if you are not doing well or get worse.  Your e-visit answers were reviewed by a board certified advanced clinical practitioner to complete your personal care plan.  Depending on the condition, your plan could have included both over the counter or prescription medications.  If there is a problem please reply  once you have received a response from your provider.  Your safety is important to us.  If you have drug allergies check your prescription carefully.    You can use MyChart to ask questions about today's visit, request a non-urgent call back, or ask for a work or school excuse for 24 hours related to this e-Visit. If it has been greater than 24 hours you will need to follow up with your provider, or enter a new e-Visit to address those concerns.  You will get an e-mail in the next two days asking about your experience.  I hope that your e-visit has been valuable and will speed your recovery. Thank you for using e-visits.   5 minutes spent reviewing and documenting in chart.  

## 2018-07-26 ENCOUNTER — Encounter: Payer: Self-pay | Admitting: Family Medicine

## 2018-07-27 ENCOUNTER — Ambulatory Visit (INDEPENDENT_AMBULATORY_CARE_PROVIDER_SITE_OTHER): Payer: Medicare Other | Admitting: Physician Assistant

## 2018-07-27 ENCOUNTER — Encounter: Payer: Self-pay | Admitting: Physician Assistant

## 2018-07-27 VITALS — BP 115/86 | HR 75 | Temp 98.8°F | Ht 66.0 in | Wt 182.0 lb

## 2018-07-27 DIAGNOSIS — J029 Acute pharyngitis, unspecified: Secondary | ICD-10-CM | POA: Diagnosis not present

## 2018-07-27 NOTE — Telephone Encounter (Signed)
Please call pt and schedule Webex with Aldona Bar today.

## 2018-07-27 NOTE — Progress Notes (Deleted)
Virtual Visit via Video   I connected with Latasha Mclaughlin on 07/27/18 at  9:40 AM EDT by a video enabled telemedicine application and verified that I am speaking with the correct person using two identifiers. Location patient: Home Location provider: [facility], Office Persons participating in the virtual visit: Arrin, Ishler, LPN, Inda Coke, PA-C  I discussed the limitations of evaluation and management by telemedicine and the availability of in person appointments. The patient expressed understanding and agreed to proceed.  Subjective:   HPI:  Sore Throat Pt had an e-visit on 3/22 and was diagnosed with acute maxillary sinusitis and was started on Augmentin 875-125 mg BID. Pt states her symptoms are worse c/o sore throat feels like her throat is on fire,ears hurting, headache,chills, fever 99.4 past few days in the afternoon. Pt denies urinary symptoms.  ROS: See pertinent positives and negatives per HPI.  Patient Active Problem List   Diagnosis Date Noted  . History of colonic polyps 11/18/2017  . Change in bowel habits 11/18/2017  . Abdominal pain, epigastric 11/18/2017  . History of hyperparathyroidism 01/08/2017  . Myalgia 01/05/2017  . Arthralgia 01/05/2017  . Fatigue 01/05/2017  . Paresthesia 01/05/2017  . Overweight (BMI 25.0-29.9) 12/16/2016  . Hypothyroidism 12/15/2016  . Hyperlipidemia 12/15/2016    Social History   Tobacco Use  . Smoking status: Former Research scientist (life sciences)  . Smokeless tobacco: Never Used  . Tobacco comment: Quit in 1985  Substance Use Topics  . Alcohol use: No    Current Outpatient Medications:  .  amoxicillin-clavulanate (AUGMENTIN) 875-125 MG tablet, Take 1 tablet by mouth 2 (two) times daily., Disp: 14 tablet, Rfl: 0 .  Cholecalciferol (VITAMIN D-1000 MAX ST) 1000 units tablet, Take 2,000 Units by mouth daily., Disp: , Rfl:  .  fluticasone (FLONASE) 50 MCG/ACT nasal spray, Place 2 sprays into both nostrils daily., Disp: 16  g, Rfl: 2 .  levothyroxine (SYNTHROID) 100 MCG tablet, TAKE 1 TABLET BY MOUTH EVERY THIRD DAY IN A 3 DAY CYCLE, Disp: 60 tablet, Rfl: 0 .  levothyroxine (SYNTHROID) 88 MCG tablet, Take 1 tablet every first and second day in a 3 day cycle for 90 days.  MUST HAVE BRAND NAME SYNTHROID, Disp: 90 tablet, Rfl: 3 .  pantoprazole (PROTONIX) 40 MG tablet, Take 1 tablet (40 mg total) by mouth daily., Disp: 30 tablet, Rfl: 6 .  rosuvastatin (CRESTOR) 10 MG tablet, TAKE 1 TABLET BY MOUTH EVERY DAY, Disp: 90 tablet, Rfl: 3  Allergies  Allergen Reactions  . Prevacid [Lansoprazole] Rash    Objective:   VITALS: Per patient if applicable, see vitals. GENERAL: Alert, appears well and in no acute distress. HEENT: Atraumatic, conjunctiva clear, no obvious abnormalities on inspection of external nose and ears. NECK: Normal movements of the head and neck. CARDIOPULMONARY: No increased WOB. Speaking in clear sentences. I:E ratio WNL.  MS: Moves all visible extremities without noticeable abnormality. PSYCH: Pleasant and cooperative, well-groomed. Speech normal rate and rhythm. Affect is appropriate. Insight and judgement are appropriate. Attention is focused, linear, and appropriate.  NEURO: CN grossly intact. Oriented as arrived to appointment on time with no prompting. Moves both UE equally.  SKIN: No obvious lesions, wounds, erythema, or cyanosis noted on face or hands.  Assessment and Plan:  Pt to continue antibiotic as directed and Flonase, continue to monitor temperature and let us know if temp is >100.4. Please follow up on Monday send [place] a message thru My Chart. If symptoms worsen please go  to Urgent care or ER.  There are no diagnoses linked to this encounter.  . Reviewed expectations re: course of current medical issues. . Discussed self-management of symptoms. . Outlined signs and symptoms indicating need for more acute intervention. . Patient verbalized understanding and all questions were  answered. Marland Kitchen Health Maintenance issues including appropriate healthy diet, exercise, and smoking avoidance were discussed with patient. . See orders for this visit as documented in the electronic medical record.  Anselmo Pickler, LPN 6/76/1949

## 2018-07-27 NOTE — Progress Notes (Signed)
   TELEPHONE ENCOUNTER   Patient verbally agreed to telephone visit and is aware that copayment and coinsurance may apply. Patient was treated using telemedicine according to accepted telemedicine protocols.  Location of the patient: home Location of provider: [place] of all persons participating in the telemedicine service and role in the encounter: Inda Coke, [place] , Wandra Arthurs [place], Anselmo Pickler LPN  Subjective:   Chief Complaint  Patient presents with  . Sore Throat     HPI   Pt had an e-visit on 3/22 and was diagnosed with acute maxillary sinusitis and was started on Augmentin 875-125 mg BID. Pt states her symptoms are worse c/o sore throat feels like her throat is on fire,ears hurting, headache,chills, fever 99.4 past few days in the afternoon. Pt denies urinary symptoms.  Has developed some loose stools since starting the antibiotic.  Patient Active Problem List   Diagnosis Date Noted  . History of colonic polyps 11/18/2017  . Change in bowel habits 11/18/2017  . Abdominal pain, epigastric 11/18/2017  . History of hyperparathyroidism 01/08/2017  . Myalgia 01/05/2017  . Arthralgia 01/05/2017  . Fatigue 01/05/2017  . Paresthesia 01/05/2017  . Overweight (BMI 25.0-29.9) 12/16/2016  . Hypothyroidism 12/15/2016  . Hyperlipidemia 12/15/2016   Social History   Tobacco Use  . Smoking status: Former Research scientist (life sciences)  . Smokeless tobacco: Never Used  . Tobacco comment: Quit in 1985  Substance Use Topics  . Alcohol use: No    Current Outpatient Medications:  .  amoxicillin-clavulanate (AUGMENTIN) 875-125 MG tablet, Take 1 tablet by mouth 2 (two) times daily., Disp: 14 tablet, Rfl: 0 .  Cholecalciferol (VITAMIN D-1000 MAX ST) 1000 units tablet, Take 2,000 Units by mouth daily., Disp: , Rfl:  .  fluticasone (FLONASE) 50 MCG/ACT nasal spray, Place 2 sprays into both nostrils daily., Disp: 16 g, Rfl: 2 .  levothyroxine (SYNTHROID) 100 MCG tablet, TAKE 1  TABLET BY MOUTH EVERY THIRD DAY IN A 3 DAY CYCLE, Disp: 60 tablet, Rfl: 0 .  levothyroxine (SYNTHROID) 88 MCG tablet, Take 1 tablet every first and second day in a 3 day cycle for 90 days.  MUST HAVE BRAND NAME SYNTHROID, Disp: 90 tablet, Rfl: 3 .  pantoprazole (PROTONIX) 40 MG tablet, Take 1 tablet (40 mg total) by mouth daily., Disp: 30 tablet, Rfl: 6 .  rosuvastatin (CRESTOR) 10 MG tablet, TAKE 1 TABLET BY MOUTH EVERY DAY, Disp: 90 tablet, Rfl: 3 Allergies  Allergen Reactions  . Prevacid [Lansoprazole] Rash    Assessment & Plan:   1. Sore throat    No red flags identified during encounter. Continue antibiotic, tylenol, flonase. Discussed taking medications as prescribed. Reviewed return precautions including worsening fever, SOB, worsening cough or other concerns. Push fluids and rest. I recommend that patient follow-up if symptoms worsen or persist despite treatment x 7-10 days, sooner if needed.   No orders of the defined types were placed in this encounter.  No orders of the defined types were placed in this encounter.   Inda Coke, [place] 07/27/2018  Time spent with the patient: 25 minutes, spent in obtaining information about her symptoms, reviewing her previous labs, evaluations, and treatments, counseling her about her condition (please see the discussed topics above), and developing a plan to further investigate it; she had a number of questions which I addressed.

## 2018-07-29 DIAGNOSIS — J0100 Acute maxillary sinusitis, unspecified: Secondary | ICD-10-CM | POA: Diagnosis not present

## 2018-07-31 ENCOUNTER — Encounter: Payer: Self-pay | Admitting: Family Medicine

## 2018-08-01 ENCOUNTER — Ambulatory Visit (INDEPENDENT_AMBULATORY_CARE_PROVIDER_SITE_OTHER): Payer: Medicare Other | Admitting: Family Medicine

## 2018-08-01 ENCOUNTER — Encounter: Payer: Self-pay | Admitting: Family Medicine

## 2018-08-01 ENCOUNTER — Other Ambulatory Visit: Payer: Self-pay

## 2018-08-01 VITALS — BP 140/91 | HR 79 | Temp 97.4°F | Ht 66.0 in | Wt 181.2 lb

## 2018-08-01 DIAGNOSIS — H6983 Other specified disorders of Eustachian tube, bilateral: Secondary | ICD-10-CM

## 2018-08-01 DIAGNOSIS — J302 Other seasonal allergic rhinitis: Secondary | ICD-10-CM

## 2018-08-01 DIAGNOSIS — H6993 Unspecified Eustachian tube disorder, bilateral: Secondary | ICD-10-CM

## 2018-08-01 DIAGNOSIS — J329 Chronic sinusitis, unspecified: Secondary | ICD-10-CM

## 2018-08-01 DIAGNOSIS — B9689 Other specified bacterial agents as the cause of diseases classified elsewhere: Secondary | ICD-10-CM | POA: Diagnosis not present

## 2018-08-01 MED ORDER — PREDNISONE 5 MG PO TABS: ORAL_TABLET | ORAL | 0 refills | Status: DC

## 2018-08-01 MED ORDER — AZITHROMYCIN 250 MG PO TABS: ORAL_TABLET | ORAL | 0 refills | Status: DC

## 2018-08-01 NOTE — Progress Notes (Signed)
Virtual Visit via Video   I connected with Latasha Mclaughlin on 08/01/18 at 10:40 AM EDT by a video enabled telemedicine application and verified that I am speaking with the correct person using two identifiers. Location patient: Home Location provider: [facility], Office Persons participating in the virtual visit: [place], [place], DO Lonell Grandchild, CMA acting as scribe for Dr. Briscoe Deutscher.   I discussed the limitations of evaluation and management by telemedicine and the availability of in person appointments. The patient expressed understanding and agreed to proceed.  Subjective:   HPI:   Hello Dr. Juleen Mclaughlin , I had a e-visit with Latasha Mclaughlin on March 22 she prescribe amoxicillin /clavulanate . I talked to Latasha Mclaughlin on March 26th. Latasha Mclaughlin said go to urgent care or ER the weekend if no improvement. Finished amoxicillin and went to Urgent care on March 28th. they prescribed doxycycline -hyclate 147mm twice a day. Every time i take the doxycycline my stomach feels like its on fire. Is this normal ? This is my third day taking !!! This is my 10th day of having a low grade fever of 99.5 to 99.8 . My normal temp. is usually 97.6 even at 98.6 i feel warm. Taking tylenol 1000 mg every 5 to 6 hrs. fever going up and down. Not a person that usually run s fever  My throat is not hurting today but my ears feel like they are going to pop  Iam not coughing ,nose not running, feels likely throat is like swollen and a headache. I am getting concerned about this fever .   Patient was seen at urgent care as well. She was evaluated but felt very rushed. The prescriptions were called in to the wrong place. She has been using sweet oil and heating pad on ears to help with pain. She was treated in February for sinus infections. She did not have any GI symptoms before she started antibiotics. She is not currently taking any prescribed allergy medications. She does have two purifiers in  the home, As well as children over the counter allergy medications. She has been using Flonase as well as saline wash. She has stopped the last few days with the Flonase thinks it was making her have a sore throat. She will try antibiotic and prednisone. If she is still having fever Friday she will call for lab work.   Reviewed all precautions and expectations with prevention of Covid-19.   ROS: See pertinent positives and negatives per HPI.  Patient Active Problem List   Diagnosis Date Noted  . History of colonic polyps 11/18/2017  . Change in bowel habits 11/18/2017  . Abdominal pain, epigastric 11/18/2017  . History of hyperparathyroidism 01/08/2017  . Myalgia 01/05/2017  . Arthralgia 01/05/2017  . Fatigue 01/05/2017  . Paresthesia 01/05/2017  . Overweight (BMI 25.0-29.9) 12/16/2016  . Hypothyroidism 12/15/2016  . Hyperlipidemia 12/15/2016    Social History   Tobacco Use  . Smoking status: Former Research scientist (life sciences)  . Smokeless tobacco: Never Used  . Tobacco comment: Quit in 1985  Substance Use Topics  . Alcohol use: No   Current Outpatient Medications:  .  Cholecalciferol (VITAMIN D-1000 MAX ST) 1000 units tablet, Take 2,000 Units by mouth daily., Disp: , Rfl:  .  fluticasone (FLONASE) 50 MCG/ACT nasal spray, Place 2 sprays into both nostrils daily., Disp: 16 g, Rfl: 2 .  levothyroxine (SYNTHROID) 100 MCG tablet, TAKE 1 TABLET BY MOUTH EVERY THIRD DAY IN A 3 DAY CYCLE,  Disp: 60 tablet, Rfl: 0 .  levothyroxine (SYNTHROID) 88 MCG tablet, Take 1 tablet every first and second day in a 3 day cycle for 90 days.  MUST HAVE BRAND NAME SYNTHROID, Disp: 90 tablet, Rfl: 3 .  pantoprazole (PROTONIX) 40 MG tablet, Take 1 tablet (40 mg total) by mouth daily., Disp: 30 tablet, Rfl: 6 .  rosuvastatin (CRESTOR) 10 MG tablet, TAKE 1 TABLET BY MOUTH EVERY DAY, Disp: 90 tablet, Rfl: 3  Allergies  Allergen Reactions  . Prevacid [Lansoprazole] Rash  . Doxycycline Other (See Comments)    Burning     Objective:   VITALS: Per patient if applicable, see vitals. GENERAL: Alert, appears well and in no acute distress. HEENT: Atraumatic, conjunctiva clear, no obvious abnormalities on inspection of external nose and ears. NECK: Normal movements of the head and neck. CARDIOPULMONARY: No increased WOB. Speaking in clear sentences. I:E ratio WNL.  MS: Moves all visible extremities without noticeable abnormality. PSYCH: Pleasant and cooperative, well-groomed. Speech normal rate and rhythm. Affect is appropriate. Insight and judgement are appropriate. Attention is focused, linear, and appropriate.  NEURO: CN grossly intact. Oriented as arrived to appointment on time with no prompting. Moves both UE equally.  SKIN: No obvious lesions, wounds, erythema, or cyanosis noted on face or hands.  Assessment and Plan:   Latasha Mclaughlin was seen today for fever, sore throat and otalgia.  Diagnoses and all orders for this visit:  Seasonal allergies  Bacterial sinusitis -     predniSONE (DELTASONE) 5 MG tablet; 6-5-4-3-2-1 -     azithromycin (ZITHROMAX) 250 MG tablet; Two tab one day one and one a day after until done  Dysfunction of both eustachian tubes -     predniSONE (DELTASONE) 5 MG tablet; 6-5-4-3-2-1 -     azithromycin (ZITHROMAX) 250 MG tablet; Two tab one day one and one a day after until done    . Reviewed expectations re: course of current medical issues. . Discussed self-management of symptoms. . Outlined signs and symptoms indicating need for more acute intervention. . Patient verbalized understanding and all questions were answered. Marland Kitchen Health Maintenance issues including appropriate healthy diet, exercise, and smoking avoidance were discussed with patient. . See orders for this visit as documented in the electronic medical record.  Briscoe Deutscher, DO 08/01/2018

## 2018-08-02 ENCOUNTER — Encounter: Payer: Self-pay | Admitting: Family Medicine

## 2018-08-03 ENCOUNTER — Encounter: Payer: Self-pay | Admitting: Family Medicine

## 2018-08-04 ENCOUNTER — Encounter: Payer: Self-pay | Admitting: Family Medicine

## 2018-08-04 NOTE — Telephone Encounter (Signed)
Added to open message.  

## 2018-08-04 NOTE — Telephone Encounter (Signed)
See note  Copied from [place] [phone]. Topic: Appointment Scheduling - Scheduling Inquiry for Clinic >> Aug 04, 2018  3:46 PM Nils Flack, Marland Kitchen wrote: Reason for CRM: pt is calling to get scheudled for labs. No orders. Please call [phone]

## 2018-08-04 NOTE — Telephone Encounter (Signed)
I forgot to add to my first message still not feeling good. Thank you

## 2018-08-08 ENCOUNTER — Encounter: Payer: Self-pay | Admitting: Family Medicine

## 2018-08-09 ENCOUNTER — Other Ambulatory Visit: Payer: Self-pay

## 2018-08-09 ENCOUNTER — Encounter: Payer: Self-pay | Admitting: Family Medicine

## 2018-08-09 ENCOUNTER — Ambulatory Visit (INDEPENDENT_AMBULATORY_CARE_PROVIDER_SITE_OTHER): Payer: Medicare Other | Admitting: Family Medicine

## 2018-08-09 DIAGNOSIS — R509 Fever, unspecified: Secondary | ICD-10-CM

## 2018-08-09 LAB — CBC WITH DIFFERENTIAL/PLATELET
Basophils Absolute: 0 10*3/uL (ref 0.0–0.1)
Basophils Relative: 0.4 % (ref 0.0–3.0)
Eosinophils Absolute: 0 10*3/uL (ref 0.0–0.7)
Eosinophils Relative: 0.5 % (ref 0.0–5.0)
HCT: 44.4 % (ref 36.0–46.0)
Hemoglobin: 15 g/dL (ref 12.0–15.0)
Lymphocytes Relative: 25.8 % (ref 12.0–46.0)
Lymphs Abs: 2.1 10*3/uL (ref 0.7–4.0)
MCHC: 33.9 g/dL (ref 30.0–36.0)
MCV: 93.1 fl (ref 78.0–100.0)
Monocytes Absolute: 0.7 10*3/uL (ref 0.1–1.0)
Monocytes Relative: 8.2 % (ref 3.0–12.0)
Neutro Abs: 5.3 10*3/uL (ref 1.4–7.7)
Neutrophils Relative %: 65.1 % (ref 43.0–77.0)
Platelets: 302 10*3/uL (ref 150.0–400.0)
RBC: 4.77 Mil/uL (ref 3.87–5.11)
RDW: 14 % (ref 11.5–15.5)
WBC: 8.1 10*3/uL (ref 4.0–10.5)

## 2018-08-09 LAB — COMPREHENSIVE METABOLIC PANEL
ALT: 14 U/L (ref 0–35)
AST: 22 U/L (ref 0–37)
Albumin: 4.4 g/dL (ref 3.5–5.2)
Alkaline Phosphatase: 45 U/L (ref 39–117)
BUN: 10 mg/dL (ref 6–23)
CO2: 25 mEq/L (ref 19–32)
Calcium: 9.8 mg/dL (ref 8.4–10.5)
Chloride: 102 mEq/L (ref 96–112)
Creatinine, Ser: 0.88 mg/dL (ref 0.40–1.20)
GFR: 63.13 mL/min (ref >60.00)
Glucose, Bld: 97 mg/dL (ref 70–99)
Potassium: 4.7 mEq/L (ref 3.5–5.1)
Sodium: 138 mEq/L (ref 135–145)
Total Bilirubin: 1.4 mg/dL — ABNORMAL HIGH (ref 0.2–1.2)
Total Protein: 6.6 g/dL (ref 6.0–8.3)

## 2018-08-09 LAB — POC INFLUENZA A&B (BINAX/QUICKVUE)
Influenza A, POC: NEGATIVE
Influenza B, POC: NEGATIVE

## 2018-08-09 LAB — POCT RAPID STREP A (OFFICE): Rapid Strep A Screen: NEGATIVE

## 2018-08-09 MED ORDER — METRONIDAZOLE 500 MG PO TABS: 500.0000 mg | ORAL_TABLET | Freq: Three times a day (TID) | ORAL | 0 refills | Status: DC

## 2018-08-09 NOTE — Telephone Encounter (Signed)
Can you call patient for car visit?

## 2018-08-09 NOTE — Progress Notes (Signed)
Car visit for labs.  Patient with green stools. No pain. No unusual odor. Recent Abx use. May be Abx associated diarrhea. Concern for C.diff as well. Sent Flagyl to pharmacy. Will have patient hold until labs from today available. Red flags reviewed.

## 2018-08-09 NOTE — Progress Notes (Signed)
Called pt and advised per Dr. Alcario Drought note.  "Patient with green stools. No pain. No unusual odor. Recent Abx use. May be Abx associated diarrhea. Concern for C.diff as well. Sent Flagyl to pharmacy. Will have patient hold until labs from today available. Red flags reviewed."   Her husband has picked up abx but she will hold until she hears back about lab results.

## 2018-08-14 ENCOUNTER — Encounter: Payer: Self-pay | Admitting: Family Medicine

## 2018-08-15 ENCOUNTER — Ambulatory Visit (INDEPENDENT_AMBULATORY_CARE_PROVIDER_SITE_OTHER): Payer: Medicare Other | Admitting: Family Medicine

## 2018-08-15 ENCOUNTER — Other Ambulatory Visit: Payer: Self-pay

## 2018-08-15 ENCOUNTER — Telehealth: Payer: Self-pay | Admitting: Family Medicine

## 2018-08-15 ENCOUNTER — Emergency Department (HOSPITAL_COMMUNITY): Payer: Medicare Other

## 2018-08-15 ENCOUNTER — Emergency Department (HOSPITAL_COMMUNITY)
Admission: EM | Admit: 2018-08-15 | Discharge: 2018-08-15 | Disposition: A | Discharge status: Home / Self Care | Payer: Medicare Other | Attending: Emergency Medicine | Admitting: Emergency Medicine

## 2018-08-15 DIAGNOSIS — E039 Hypothyroidism, unspecified: Secondary | ICD-10-CM | POA: Insufficient documentation

## 2018-08-15 DIAGNOSIS — Z79899 Other long term (current) drug therapy: Secondary | ICD-10-CM | POA: Diagnosis not present

## 2018-08-15 DIAGNOSIS — Z87891 Personal history of nicotine dependence: Secondary | ICD-10-CM | POA: Insufficient documentation

## 2018-08-15 DIAGNOSIS — K449 Diaphragmatic hernia without obstruction or gangrene: Secondary | ICD-10-CM | POA: Insufficient documentation

## 2018-08-15 DIAGNOSIS — R1084 Generalized abdominal pain: Secondary | ICD-10-CM | POA: Diagnosis present

## 2018-08-15 DIAGNOSIS — E663 Overweight: Secondary | ICD-10-CM

## 2018-08-15 DIAGNOSIS — R1010 Upper abdominal pain, unspecified: Secondary | ICD-10-CM | POA: Diagnosis not present

## 2018-08-15 DIAGNOSIS — R05 Cough: Secondary | ICD-10-CM | POA: Diagnosis not present

## 2018-08-15 DIAGNOSIS — R0981 Nasal congestion: Secondary | ICD-10-CM | POA: Diagnosis not present

## 2018-08-15 DIAGNOSIS — R509 Fever, unspecified: Secondary | ICD-10-CM

## 2018-08-15 IMAGING — CT CT ABDOMEN AND PELVIS WITH CONTRAST
2 of 5 series · 16 of 46 positions shown, 18 images · IV contrast (ISOVUE)
Comparison: CT the abdomen and pelvis [DATE].

CLINICAL DATA: 73-year-old female with history of fevers, malaise,
fatigue, cough and congestion despite treatment with antibiotics and
prednisone. Generalized abdominal discomfort with radiation to the
back.

EXAM:
CT ABDOMEN AND PELVIS WITH CONTRAST
TECHNIQUE: Multidetector CT imaging of the abdomen and pelvis was performed
using the standard protocol following bolus administration of
intravenous contrast.
CONTRAST:  100mL OMNIPAQUE IOHEXOL 300 MG/ML  SOLN

[Series 2: axial st · axial · 0.98mm/px · z∈[+910,+1350]mm · 13 of 102 slices shown, 15 images]
[im 7/102  soft-tissue]
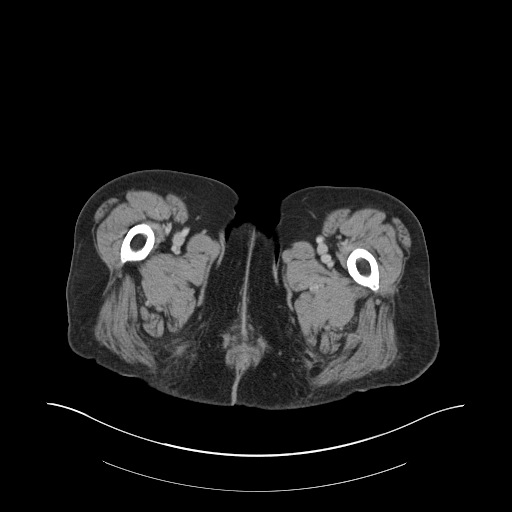
[im 7/102  bone]
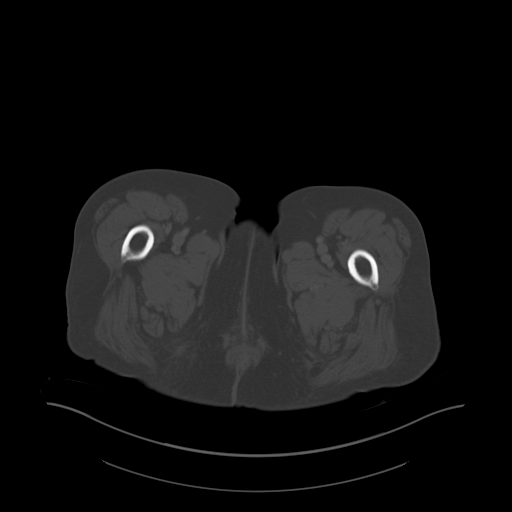
[im 13/102  soft-tissue]
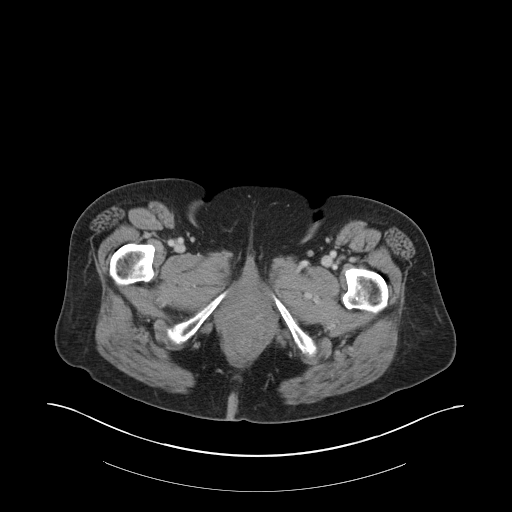
[im 19/102  soft-tissue]
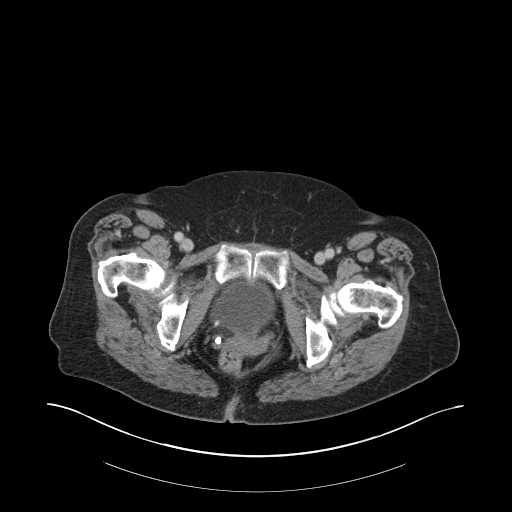
[im 32/102  soft-tissue]
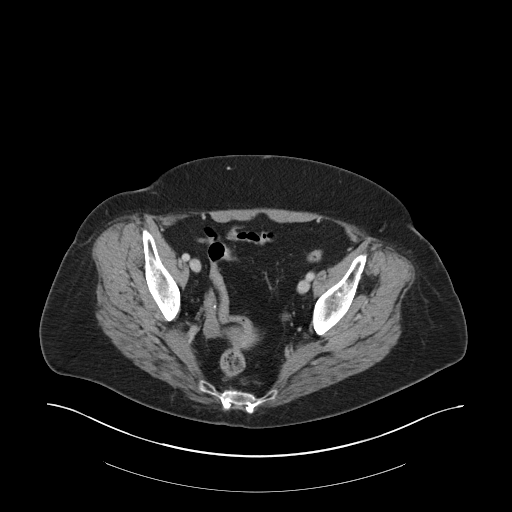
[im 38/102  soft-tissue]
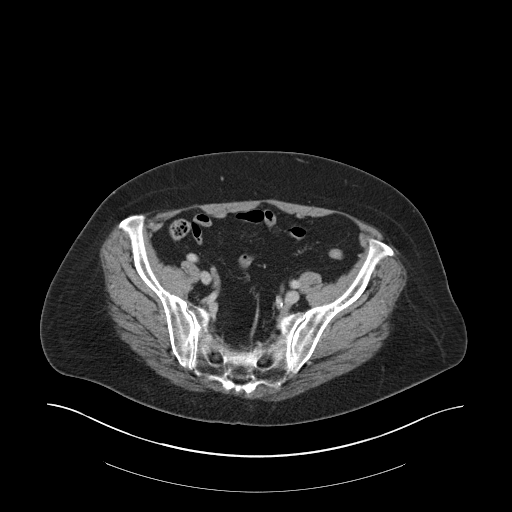
[im 45/102  soft-tissue]
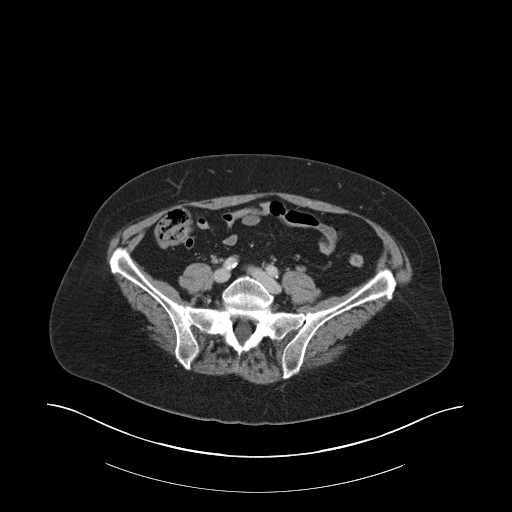
[im 51/102  soft-tissue]
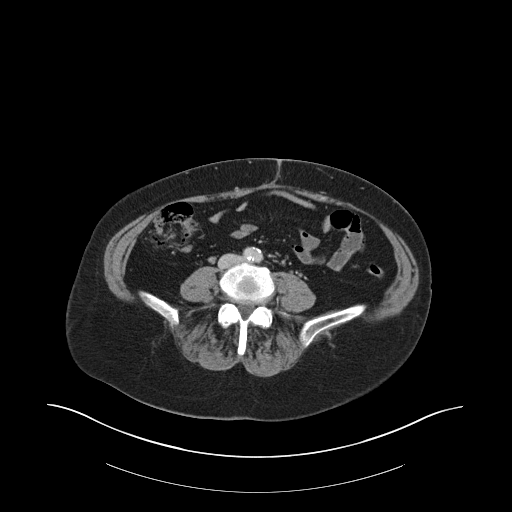
[im 57/102  soft-tissue]
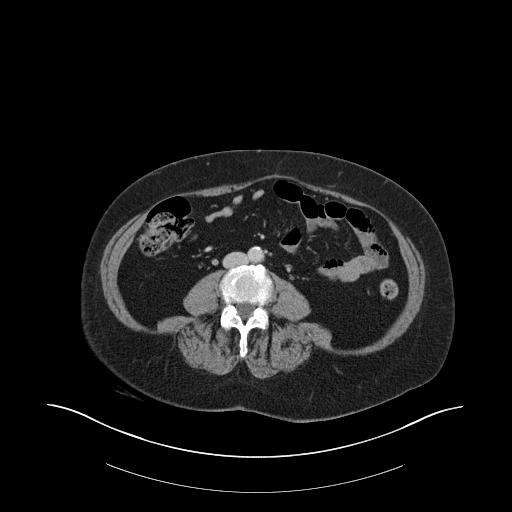
[im 64/102  soft-tissue]
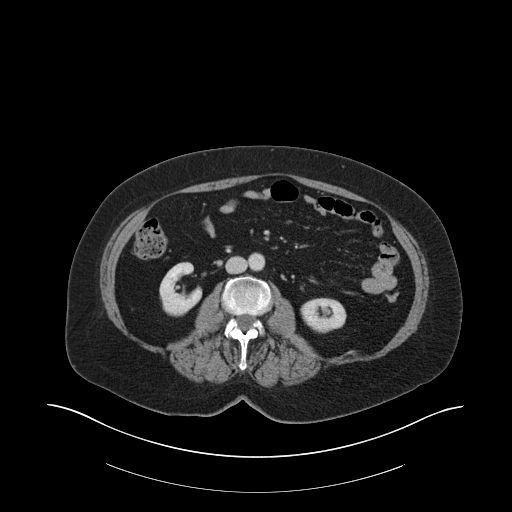
[im 64/102  bone]
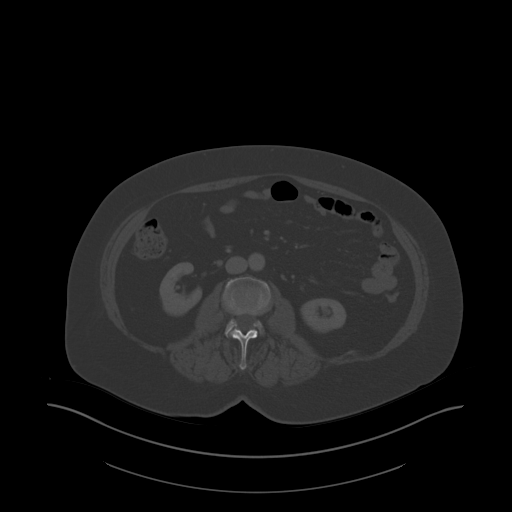
[im 70/102  soft-tissue]
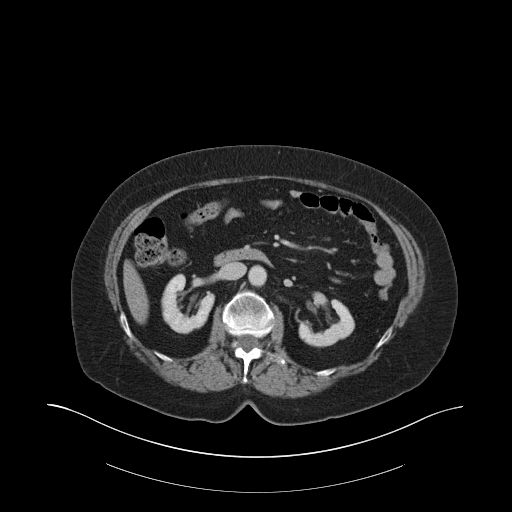
[im 83/102  soft-tissue]
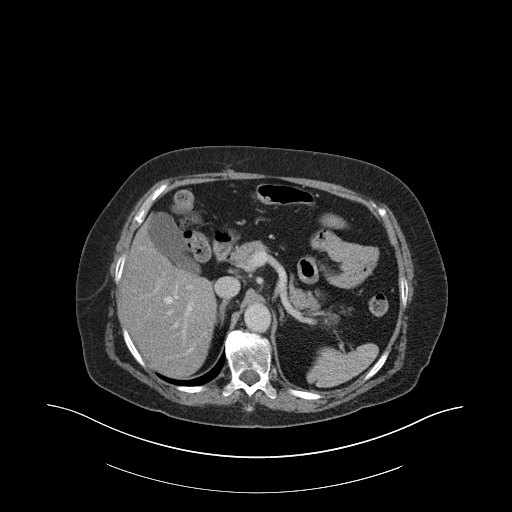
[im 89/102  soft-tissue]
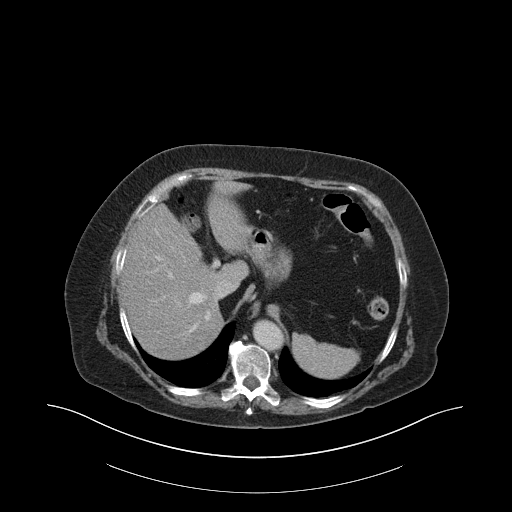
[im 95/102  soft-tissue]
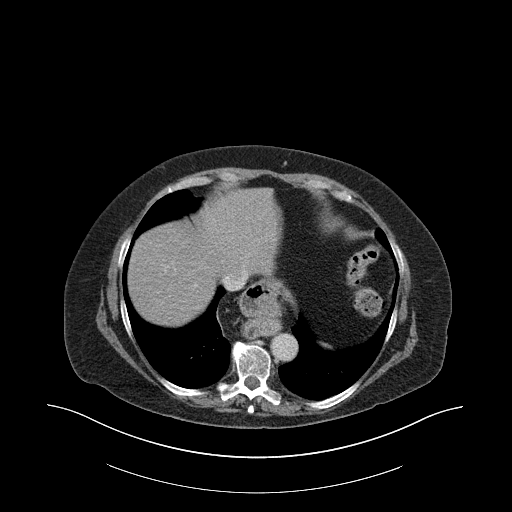

[Series 4: coronal st · coronal · 0.79mm/px · 3 of 150 slices shown]
[im 50/150  soft-tissue]
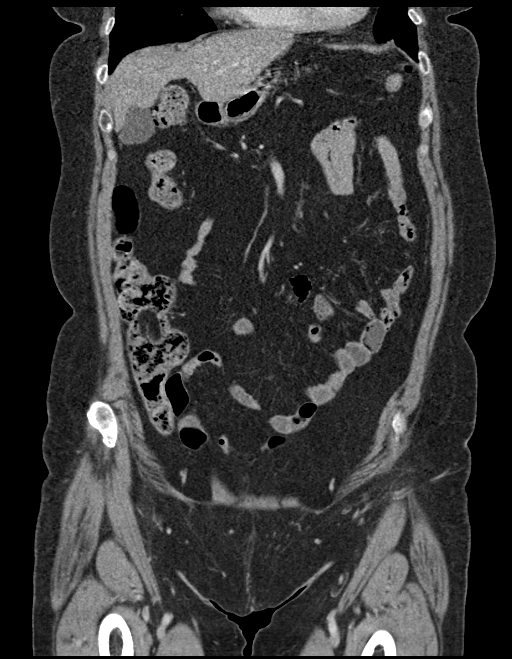
[im 67/150  soft-tissue]
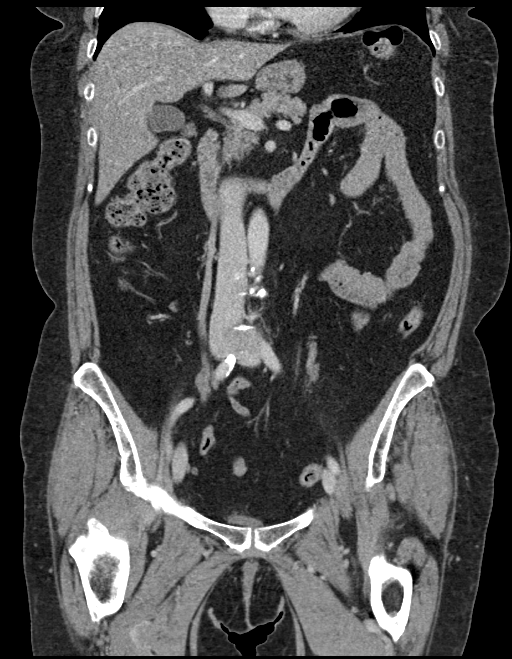
[im 83/150  soft-tissue]
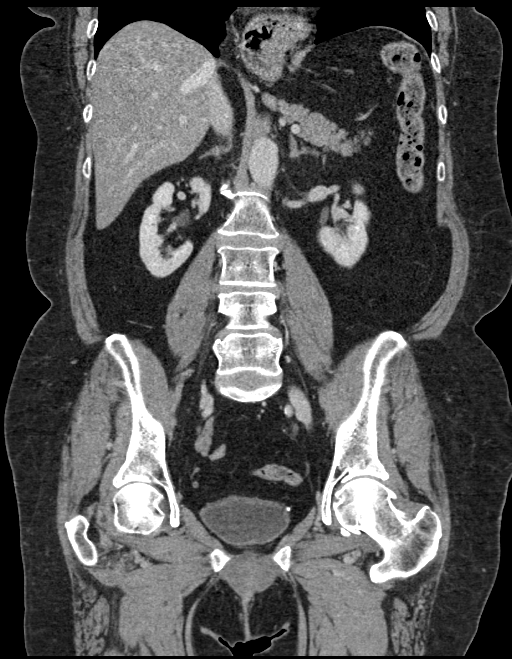

[16 of 46 positions shown; findings below may reference images not displayed]

FINDINGS: Lower chest: Large hiatal hernia.

Hepatobiliary: Subcentimeter low-attenuation lesion in segment 3,
too small to characterize, but similar to the prior study from [[facility]],
presumably a benign lesions such as a small cyst. No other
suspicious hepatic lesions. No intra or extrahepatic biliary ductal
dilatation. Gallbladder is normal in appearance.

Pancreas: No pancreatic mass. No pancreatic ductal dilatation. No
pancreatic or peripancreatic fluid or inflammatory changes.

Spleen: Unremarkable.

Adrenals/Urinary Tract: Subcentimeter low-attenuation lesions in
both kidneys, too small to characterize, but statistically likely to
represent tiny cysts. No hydroureteronephrosis. Urinary bladder is
normal in appearance. Bilateral adrenal glands are normal in
appearance.

Stomach/Bowel: Normal appearance of the intra-abdominal portion of
the stomach. No pathologic dilatation of small bowel or colon.
Normal appendix.

Vascular/Lymphatic: Aortic atherosclerosis, without evidence of
aneurysm or dissection in the abdominal or pelvic vasculature. No
lymphadenopathy noted in the abdomen or pelvis.

Reproductive: Uterus and ovaries are unremarkable in appearance.

Other: No significant volume of ascites.  No pneumoperitoneum.

Musculoskeletal: There are no aggressive appearing lytic or blastic
lesions noted in the visualized portions of the skeleton.
IMPRESSION: 1. No acute findings are noted in the abdomen or pelvis to account
for the patient's symptoms.
2. Large hiatal hernia.
3. Aortic atherosclerosis.
4. Additional incidental findings, as above.

## 2018-08-15 MED ORDER — SUCRALFATE 1 G PO TABS: 1.0000 g | ORAL_TABLET | Freq: Three times a day (TID) | ORAL | 1 refills | Status: DC

## 2018-08-15 MED ORDER — IOHEXOL 300 MG/ML  SOLN
100.0000 mL | Freq: Once | INTRAMUSCULAR | Status: AC | PRN
  Administered 2018-08-15: 100 mL via INTRAVENOUS

## 2018-08-15 MED ORDER — SODIUM CHLORIDE (PF) 0.9 % IJ SOLN
INTRAMUSCULAR | Status: AC
  Filled 2018-08-15: qty 50

## 2018-08-15 NOTE — ED Provider Notes (Signed)
[facility] DEPT Provider Note   CSN: [id number] Arrival date & time: 08/15/18  1136    History   Chief Complaint Chief Complaint  Patient presents with  . Abdominal Pain    HPI Latasha Mclaughlin is a 73 y.o. female.     HPI 73 year old female comes in a chief complaint of abdominal pain.  Latasha Mclaughlin has history of GERD, hypertension, hyperlipidemia and a hiatal hernia.  Patient states that for the past few days Latasha Mclaughlin has been having abdominal discomfort.  Latasha Mclaughlin feels that Latasha Mclaughlin abdomen is distended and Latasha Mclaughlin has a constant burning type pain that is radiating up towards Latasha Mclaughlin neck and down Latasha Mclaughlin legs.  Latasha Mclaughlin has never had symptoms like this before and the symptoms are not similar to Latasha Mclaughlin GERD pain.  Latasha Mclaughlin denies any cough, fevers or chills.  Latasha Mclaughlin also does not have any shortness of breath.  Latasha Mclaughlin has been talking to Latasha Mclaughlin PCP and they ordered a CT scan, however the outpatient radiology team declined the CAT scan because Latasha Mclaughlin has some COVID-19-like symptoms.  Latasha Mclaughlin was therefore advised to come to the ER for further assessment.  Review of system is positive for nausea, anorexia, loss of taste.  Past Medical History:  Diagnosis Date  . Arthritis   . Back pain   . GERD (gastroesophageal reflux disease)   . History of tonsillectomy   . Hyperlipidemia 12/15/2016  . Hypothyroidism 12/15/2016  . Osteoporosis   . Sinus infection     Patient Active Problem List   Diagnosis Date Noted  . History of colonic polyps 11/18/2017  . Change in bowel habits 11/18/2017  . Abdominal pain, epigastric 11/18/2017  . History of hyperparathyroidism 01/08/2017  . Myalgia 01/05/2017  . Arthralgia 01/05/2017  . Fatigue 01/05/2017  . Paresthesia 01/05/2017  . Overweight (BMI 25.0-29.9) 12/16/2016  . Hypothyroidism 12/15/2016  . Hyperlipidemia 12/15/2016    Past Surgical History:  Procedure Laterality Date  . ADENOIDECTOMY    . ANTERIOR CERVICAL DECOMP/DISCECTOMY FUSION  2002  . DILATION AND  CURETTAGE OF UTERUS     X2  . LEFT OOPHORECTOMY    . PARATHYROIDECTOMY    . SHOULDER SURGERY    . TONSILLECTOMY       OB History   No obstetric history on file.      Home Medications    Prior to Admission medications   Medication Sig Start Date End Date Taking? Authorizing Provider  Cholecalciferol (VITAMIN D) 50 MCG (2000 UT) tablet Take 2,000 Units by mouth daily.   Yes [provider]  Esomeprazole Magnesium (NEXIUM PO) Take 1 capsule by mouth daily as needed (acid reflux).   Yes [provider]  fluticasone (FLONASE) 50 MCG/ACT nasal spray Place 2 sprays into both nostrils daily. 12/22/17  Yes Orma Flaming, MD  levothyroxine (SYNTHROID) 100 MCG tablet TAKE 1 TABLET BY MOUTH EVERY THIRD DAY IN A 3 DAY CYCLE Patient taking differently: Take 100 mcg by mouth as directed. TAKE 179mcg BY MOUTH EVERY THIRD DAY IN A 3 DAY CYCLE 03/01/18  Yes Briscoe Deutscher, DO  levothyroxine (SYNTHROID) 88 MCG tablet Take 1 tablet every first and second day in a 3 day cycle for 90 days.  MUST HAVE BRAND NAME SYNTHROID Patient taking differently: Take 88 mcg by mouth as directed. Take 92mcg every first and second day in a 3 day cycle for 90 days.  MUST HAVE BRAND NAME SYNTHROID 01/19/17  Yes Briscoe Deutscher, DO  Olive Oil (SWEET OIL) OIL Place 1  drop into both ears as needed (ear irritation).   Yes [provider]  pantoprazole (PROTONIX) 40 MG tablet Take 1 tablet (40 mg total) by mouth daily. Patient taking differently: Take 40 mg by mouth daily as needed (acid reflux).  02/06/18  Yes Zehr, Janett Billow D, PA-C  rosuvastatin (CRESTOR) 10 MG tablet TAKE 1 TABLET BY MOUTH EVERY DAY Patient taking differently: Take 10 mg by mouth daily.  06/14/18  Yes Briscoe Deutscher, DO  sucralfate (CARAFATE) 1 g tablet Take 1 tablet (1 g total) by mouth 4 (four) times daily -  with meals and at bedtime. 08/15/18   Varney Biles, MD    Family History Family History  Problem Relation Age of Onset  .  Hyperlipidemia Mother   . Hypertension Mother   . Congestive Heart Failure Mother   . Cancer Father   . Diabetes Father   . Heart disease Father   . Hyperlipidemia Father   . Thyroid disease Sister   . Drug abuse Brother   . Diabetes Son     Social History Social History   Tobacco Use  . Smoking status: Former Research scientist (life sciences)  . Smokeless tobacco: Never Used  . Tobacco comment: Quit in 1985  Substance Use Topics  . Alcohol use: No  . Drug use: No     Allergies   Prevacid [lansoprazole] and Doxycycline   Review of Systems Review of Systems  Constitutional: Positive for activity change. Negative for fever.  Respiratory: Negative for cough and shortness of breath.   Cardiovascular: Negative for chest pain.  Gastrointestinal: Positive for abdominal pain and nausea.  Allergic/Immunologic: Negative for immunocompromised state.  Neurological: Negative for weakness.  All other systems reviewed and are negative.    Physical Exam Updated Vital Signs BP (!) 142/83   Pulse 65   Temp 97.8 F (36.6 C) (Oral)   Resp 17   SpO2 100%   Physical Exam Vitals signs and nursing note reviewed.  Constitutional:      Appearance: Latasha Mclaughlin is well-developed.  HENT:     Head: Normocephalic and atraumatic.  Neck:     Musculoskeletal: Normal range of motion and neck supple.  Cardiovascular:     Rate and Rhythm: Normal rate.  Pulmonary:     Effort: Pulmonary effort is normal.  Abdominal:     General: Bowel sounds are normal.     Tenderness: There is generalized abdominal tenderness.  Skin:    General: Skin is warm and dry.  Neurological:     Mental Status: Latasha Mclaughlin is alert and oriented to person, place, and time.      ED Treatments / Results  Labs (all labs ordered are listed, but only abnormal results are displayed) Labs Reviewed - No data to display  EKG None  Radiology Ct Abdomen Pelvis W Contrast  Result Date: 08/15/2018 CLINICAL DATA:  73 year old female with history of  fevers, malaise, fatigue, cough and congestion despite treatment with antibiotics and prednisone. Generalized abdominal discomfort with radiation to the back. EXAM: CT ABDOMEN AND PELVIS WITH CONTRAST TECHNIQUE: Multidetector CT imaging of the abdomen and pelvis was performed using the standard protocol following bolus administration of intravenous contrast. CONTRAST:  119mL OMNIPAQUE IOHEXOL 300 MG/ML  SOLN COMPARISON:  CT the abdomen and pelvis 02/03/2017. FINDINGS: Lower chest: Large hiatal hernia. Hepatobiliary: Subcentimeter low-attenuation lesion in segment 3, too small to characterize, but similar to the prior study from 2018, presumably a benign lesions such as a small cyst. No other suspicious hepatic lesions. No intra  or extrahepatic biliary ductal dilatation. Gallbladder is normal in appearance. Pancreas: No pancreatic mass. No pancreatic ductal dilatation. No pancreatic or peripancreatic fluid or inflammatory changes. Spleen: Unremarkable. Adrenals/Urinary Tract: Subcentimeter low-attenuation lesions in both kidneys, too small to characterize, but statistically likely to represent tiny cysts. No hydroureteronephrosis. Urinary bladder is normal in appearance. Bilateral adrenal glands are normal in appearance. Stomach/Bowel: Normal appearance of the intra-abdominal portion of the stomach. No pathologic dilatation of small bowel or colon. Normal appendix. Vascular/Lymphatic: Aortic atherosclerosis, without evidence of aneurysm or dissection in the abdominal or pelvic vasculature. No lymphadenopathy noted in the abdomen or pelvis. Reproductive: Uterus and ovaries are unremarkable in appearance. Other: No significant volume of ascites.  No pneumoperitoneum. Musculoskeletal: There are no aggressive appearing lytic or blastic lesions noted in the visualized portions of the skeleton. IMPRESSION: 1. No acute findings are noted in the abdomen or pelvis to account for the patient's symptoms. 2. Large hiatal hernia.  3. Aortic atherosclerosis. 4. Additional incidental findings, as above. Electronically Signed   By: Vinnie Langton M.D.   On: 08/15/2018 15:38    Procedures Procedures (including critical care time)  Medications Ordered in ED Medications  sodium chloride (PF) 0.9 % injection (has no administration in time range)  iohexol (OMNIPAQUE) 300 MG/ML solution 100 mL (100 mLs Intravenous Contrast Given 08/15/18 1513)     Initial Impression / Assessment and Plan / ED Course  I have reviewed the triage vital signs and the nursing notes.  Pertinent labs & imaging results that were available during my care of the patient were reviewed by me and considered in my medical decision making (see chart for details).  Clinical Course as of Aug 14 1616  Tue Aug 15, 2018  1617 Results from the ER workup discussed with the patient face to face and all questions answered to the best of my ability.  I suspect that patient's symptoms are because of worsening hiatal hernia. CT scan otherwise does not reveal any acute pathology.  Results of the ER discussed with the patient.  Upper endoscopy from September reviewed with the patient.  I have advised Latasha Mclaughlin to follow-up with general surgery for further recommendation.  We will start Latasha Mclaughlin on Carafate.  CT ABDOMEN PELVIS W CONTRAST [AN]    Clinical Course User Index [AN] Varney Biles, MD       73 year old female comes in a chief complaint of abdominal discomfort. Latasha Mclaughlin is also having anorexia, nausea, loss of taste.  Latasha Mclaughlin had an outpatient CT scan ordered which was not completed because of Latasha Mclaughlin having some COVID-19-like symptoms.  Based on my evaluation, there is no acute emergent process going on.  We will get a CT scan of the abdomen and pelvis as this will help the PCP further manage the patient.  Latasha Mclaughlin does not meet criteria for COVID testing based on [place] protocol at this time.  Latasha Mclaughlin was evaluated in Emergency Department on 08/15/2018 for the  symptoms described in the history of present illness. Latasha Mclaughlin was evaluated in the context of the global COVID-19 pandemic, which necessitated consideration that the patient might be at risk for infection with the SARS-CoV-2 virus that causes COVID-19. Institutional protocols and algorithms that pertain to the evaluation of patients at risk for COVID-19 are in a state of rapid change based on information released by regulatory bodies including the CDC and federal and state organizations. These policies and algorithms were followed during the patient's care in the ED.   Final Clinical  Impressions(s) / ED Diagnoses   Final diagnoses:  Hiatal hernia    ED Discharge Orders         Ordered    sucralfate (CARAFATE) 1 g tablet  3 times daily with meals & bedtime     08/15/18 1548           Varney Biles, MD 08/15/18 [phone]

## 2018-08-15 NOTE — Telephone Encounter (Signed)
App made pt will be seen by [email or web address] today

## 2018-08-15 NOTE — Progress Notes (Signed)
Virtual Visit via Video   I connected with Latasha Mclaughlin on 08/15/18 at  2:00 PM EDT by a video enabled telemedicine application and verified that I am speaking with the correct person using two identifiers. Location patient: Home Location provider: [facility], Office Persons participating in the virtual visit: Saryna, Kneeland, DO   I discussed the limitations of evaluation and management by telemedicine and the availability of in person appointments. The patient expressed understanding and agreed to proceed.  Subjective:   HPI: Patient with few week history of fevers, malaise, fatigue, cough, congestion. S/P Abx x 2, prednisone. Still with fatigue, intermittent fevers per her report, now with abdominal discomfort that is generalized with radiation to back - both mid and lower. Denies CP, SOB, edema. Hx of cholelithiasis, fatty liver.   ROS: See pertinent positives and negatives per HPI.  Patient Active Problem List   Diagnosis Date Noted  . History of colonic polyps 11/18/2017  . Change in bowel habits 11/18/2017  . Abdominal pain, epigastric 11/18/2017  . History of hyperparathyroidism 01/08/2017  . Myalgia 01/05/2017  . Arthralgia 01/05/2017  . Fatigue 01/05/2017  . Paresthesia 01/05/2017  . Overweight (BMI 25.0-29.9) 12/16/2016  . Hypothyroidism 12/15/2016  . Hyperlipidemia 12/15/2016    Social History   Tobacco Use  . Smoking status: Former Research scientist (life sciences)  . Smokeless tobacco: Never Used  . Tobacco comment: Quit in 1985  Substance Use Topics  . Alcohol use: No    Current Outpatient Medications:  .  Cholecalciferol (VITAMIN D-1000 MAX ST) 1000 units tablet, Take 2,000 Units by mouth daily., Disp: , Rfl:  .  fluticasone (FLONASE) 50 MCG/ACT nasal spray, Place 2 sprays into both nostrils daily., Disp: 16 g, Rfl: 2 .  levothyroxine (SYNTHROID) 100 MCG tablet, TAKE 1 TABLET BY MOUTH EVERY THIRD DAY IN A 3 DAY CYCLE, Disp: 60 tablet, Rfl: 0 .  levothyroxine  (SYNTHROID) 88 MCG tablet, Take 1 tablet every first and second day in a 3 day cycle for 90 days.  MUST HAVE BRAND NAME SYNTHROID, Disp: 90 tablet, Rfl: 3 .  pantoprazole (PROTONIX) 40 MG tablet, Take 1 tablet (40 mg total) by mouth daily., Disp: 30 tablet, Rfl: 6 .  rosuvastatin (CRESTOR) 10 MG tablet, TAKE 1 TABLET BY MOUTH EVERY DAY, Disp: 90 tablet, Rfl: 3  Allergies  Allergen Reactions  . Prevacid [Lansoprazole] Rash  . Doxycycline Other (See Comments)    Burning     Objective:   VITALS: Per patient if applicable, see vitals. GENERAL: Alert, appears well and in no acute distress. HEENT: Atraumatic, conjunctiva clear, no obvious abnormalities on inspection of external nose and ears. NECK: Normal movements of the head and neck. CARDIOPULMONARY: No increased WOB. Speaking in clear sentences. I:E ratio WNL.  MS: Moves all visible extremities without noticeable abnormality. PSYCH: Pleasant and cooperative, well-groomed. Speech normal rate and rhythm. Affect is appropriate. Insight and judgement are appropriate. Attention is focused, linear, and appropriate.  NEURO: CN grossly intact. Oriented as arrived to appointment on time with no prompting. Moves both UE equally.  SKIN: No obvious lesions, wounds, erythema, or cyanosis noted on face or hands.  CT ABDPELVIS 2019 IMPRESSION: 1. No hydronephrosis.  No urolithiasis. 2. No evidence of bowel obstruction or acute bowel inflammation. Normal appendix. 3. Possible cholelithiasis. No evidence of acute cholecystitis. No biliary ductal dilatation. 4. Moderate hiatal hernia. 5. Mild diffuse hepatic steatosis. 6. Soft tissue mass posterior to the bladder that morphologically resembles a grossly normal  uterus in this patient with an electronic medical history of hysterectomy (per epic). Correlation with accurate gynecologic surgical history is necessary.  Results for orders placed or performed in visit on 08/09/18  CBC with  Differential/Platelet  Result Value Ref Range   WBC 8.1 4.0 - 10.5 K/uL   RBC 4.77 3.87 - 5.11 Mil/uL   Hemoglobin 15.0 12.0 - 15.0 g/dL   HCT 44.4 36.0 - 46.0 %   MCV 93.1 78.0 - 100.0 fl   MCHC 33.9 30.0 - 36.0 g/dL   RDW 14.0 11.5 - 15.5 %   Platelets 302.0 150.0 - 400.0 K/uL   Neutrophils Relative % 65.1 43.0 - 77.0 %   Lymphocytes Relative 25.8 12.0 - 46.0 %   Monocytes Relative 8.2 3.0 - 12.0 %   Eosinophils Relative 0.5 0.0 - 5.0 %   Basophils Relative 0.4 0.0 - 3.0 %   Neutro Abs 5.3 1.4 - 7.7 K/uL   Lymphs Abs 2.1 0.7 - 4.0 K/uL   Monocytes Absolute 0.7 0.1 - 1.0 K/uL   Eosinophils Absolute 0.0 0.0 - 0.7 K/uL   Basophils Absolute 0.0 0.0 - 0.1 K/uL  Comprehensive metabolic panel  Result Value Ref Range   Sodium 138 135 - 145 mEq/L   Potassium 4.7 3.5 - 5.1 mEq/L   Chloride 102 96 - 112 mEq/L   CO2 25 19 - 32 mEq/L   Glucose, Bld 97 70 - 99 mg/dL   BUN 10 6 - 23 mg/dL   Creatinine, Ser 0.88 0.40 - 1.20 mg/dL   Total Bilirubin 1.4 (H) 0.2 - 1.2 mg/dL   Alkaline Phosphatase 45 39 - 117 U/L   AST 22 0 - 37 U/L   ALT 14 0 - 35 U/L   Total Protein 6.6 6.0 - 8.3 g/dL   Albumin 4.4 3.5 - 5.2 g/dL   Calcium 9.8 8.4 - 10.5 mg/dL   GFR 63.13 >60.00 mL/min  POC Influenza A&B(BINAX/QUICKVUE)  Result Value Ref Range   Influenza A, POC Negative Negative   Influenza B, POC Negative Negative  POCT rapid strep A  Result Value Ref Range   Rapid Strep A Screen Negative Negative   Assessment and Plan:   Latasha Mclaughlin was seen today for abdominal pain. Hx of fevers, with URI symptoms over the past 2 weeks. Need CT abd/pelvis at this point. Unable to obtain COVID testing - we asked but limited to hospital at this time. Instructed to send to ED where protocol in place for COVID screening with imaging.   Diagnoses and all orders for this visit:  Pain of upper abdomen -     CT Abdomen Pelvis W Contrast  Suspected Covid-19 Virus Infection  Fever, unspecified fever cause  Overweight  (BMI 25.0-29.9)   . Reviewed expectations re: course of current medical issues. . Discussed self-management of symptoms. . Outlined signs and symptoms indicating need for more acute intervention. . Patient verbalized understanding and all questions were answered. Marland Kitchen Health Maintenance issues including appropriate healthy diet, exercise, and smoking avoidance were discussed with patient. . See orders for this visit as documented in the electronic medical record.  Briscoe Deutscher, DO 08/15/2018   Records requested if needed. Time spent: 25 minutes, of which >50% was spent in obtaining information about her symptoms, reviewing her previous labs, evaluations, and treatments, counseling her about her condition (please see the discussed topics above), and developing a plan to further investigate it; she had a number of questions which I addressed.

## 2018-08-15 NOTE — Discharge Instructions (Addendum)
You are seen in the ER for abdominal pain.  CT scan was completed, and it reveals a large hiatal hernia. These hernias are known to cause abdominal and chest discomfort. Please take the medication prescribed to see if they can provide some relief.  Additionally consider following up with surgeon for further evaluation.

## 2018-08-15 NOTE — ED Triage Notes (Addendum)
Pt sent by PCP for abdominal pain and possible covid 19. Pt could not get CT due to possible covid. Pt complains of fever and fatigue the past 2 weeks, denies cough or shortness of breath. Pt states she has not had fever for a week. Pt has not taken any medication for fever today.

## 2018-08-15 NOTE — Telephone Encounter (Signed)
STAT CT abdomen and pelvis was put in this morning.  [facility] and [facility] cannot take her due to COVID symptoms.  I spoke to Wes ( [employer] manager ) - he said at this point she needs to check in to [facility] ED to be evaluated.  From there, orders will be placed and they will decide how to proceed given her symptoms.  I spoke to Vicente Males at [facility] ED - she said all COVID related cases are getting deferred to [facility] ED.  I called and spoke to [facility], Agricultural consultant, I explained the CT, abdominal pain and COVID symptoms and she ok'd for [place] to send her on over.  I spoke to [place] and she voiced understanding about everything.  She said she will go to [facility] ED.

## 2018-08-15 NOTE — ED Notes (Signed)
Patient transported to CT 

## 2018-08-20 ENCOUNTER — Encounter: Payer: Self-pay | Admitting: Family Medicine

## 2018-08-21 ENCOUNTER — Encounter: Payer: Self-pay | Admitting: Family Medicine

## 2018-08-24 ENCOUNTER — Other Ambulatory Visit: Payer: Self-pay

## 2018-08-24 DIAGNOSIS — R1013 Epigastric pain: Secondary | ICD-10-CM

## 2018-08-25 NOTE — Telephone Encounter (Signed)
[phone] Pt is calling about message and would like return call  Thanks

## 2018-08-25 NOTE — Telephone Encounter (Signed)
Pt calling for JoEllen. She has not received a call from GI for the urgent referral and was advised to f/u if she had not heard from them by Noon today. Pt also notes that the RX for hydrocodone was not sent to her pharmacy. Please call back at [phone].

## 2018-08-27 MED ORDER — HYDROCODONE-ACETAMINOPHEN 5-325 MG PO TABS: 1.0000 | ORAL_TABLET | Freq: Four times a day (QID) | ORAL | 0 refills | Status: DC | PRN

## 2018-08-28 ENCOUNTER — Other Ambulatory Visit: Payer: Self-pay

## 2018-08-30 ENCOUNTER — Encounter: Payer: Self-pay | Admitting: Gastroenterology

## 2018-08-30 ENCOUNTER — Ambulatory Visit (INDEPENDENT_AMBULATORY_CARE_PROVIDER_SITE_OTHER): Payer: Medicare Other | Admitting: Gastroenterology

## 2018-08-30 ENCOUNTER — Other Ambulatory Visit: Payer: Self-pay

## 2018-08-30 VITALS — Ht 66.0 in | Wt 177.0 lb

## 2018-08-30 DIAGNOSIS — R1084 Generalized abdominal pain: Secondary | ICD-10-CM | POA: Diagnosis not present

## 2018-08-30 DIAGNOSIS — K219 Gastro-esophageal reflux disease without esophagitis: Secondary | ICD-10-CM | POA: Diagnosis not present

## 2018-08-30 DIAGNOSIS — R194 Change in bowel habit: Secondary | ICD-10-CM

## 2018-08-30 DIAGNOSIS — K449 Diaphragmatic hernia without obstruction or gangrene: Secondary | ICD-10-CM | POA: Diagnosis not present

## 2018-08-30 MED ORDER — DICYCLOMINE HCL 20 MG PO TABS: 20.0000 mg | ORAL_TABLET | Freq: Three times a day (TID) | ORAL | 11 refills | Status: DC

## 2018-08-30 NOTE — Progress Notes (Signed)
    History of Present Illness: This is a 73 year old female complaining of constant generalized abdominal pain for several weeks.  She was evaluated in the ED on April 14 in person with abdominal exam showing generalized tenderness and by her PCP on April 14 by telemedicine. CMP, CBC on April 8 unremarkable except for bilirubin 1.4.  She complains of several weeks of generalized abdominal pain with a burning quality. She has occasionally looser stools and occasionally skips a bowel movement on certain days.  Food worsens her abdominal pain.  She had discontinued pantoprazole.  She relates frequent nausea, decreased appetite and about a 7 pound weight loss.  She was treated with 2 courses of antibiotics and prednisone in April.  She denies fevers, chills, body aches, headache, cough, congestion, constipation, diarrhea, change in stool caliber, melena, hematochezia, nausea, vomiting, dysphagia, chest pain.  Abd/pelvic CT 08/15/2018 1. No acute findings are noted in the abdomen or pelvis to account for the patient's symptoms. 2. Large hiatal hernia. 3. Aortic atherosclerosis. 4. Additional incidental findings, as above.  EGD 01/2018 - Benign-appearing esophageal stenosis. - Medium-sized hiatal hernia. - Normal duodenal bulb and second portion of the duodenum. - No specimens collected.  Colonoscopy 01/2018 - Two 6 to 7 mm polyps in the ascending colon, removed with a cold snare. Resected and retrieved. - Two 4 to 5 mm polyps in the ascending colon, removed with a cold biopsy forceps. Resected and retrieved. - Lipoma in ascending colon. - A single non-bleeding colonic angiodysplastic lesion. - External hemorrhoids. - The examination was otherwise normal on direct and retroflexion views.   Current Medications, Allergies, Past Medical History, Past Surgical History, Family History and Social History were reviewed in [employer] record.   Physical Exam: Telemedicine - not  performed   Assessment and Recommendations:  1.  Abdominal pain, change in bowel habits.  Suspected IBS.  Begin dicyclomine 20 mg p.o. 4 times daily, taken before meals and at bedtime.  Begin Align once daily until symptoms substantially improve. Bland diet: avoiding milk products, caffeine, high fat foods, raw fruits, raw vegetables and spicy foods until symptoms resolve.  Avoid any foods, beverages that exacerbate symptoms.  If symptoms do not improve will plan for CBC, CMP, lipase and a GI pathogen panel. REV in 1 month.   2. GERD with a large HH by CT, medium HH on EGD.  Follow all standard antireflux measures long term.  Resume pantoprazole at 40 mg twice daily. TUMS prn.   3.  Personal history of adenomatous colon polyps.  A 3-year interval surveillance colonoscopy is recommended in September 2022.   These services were provided via telemedicine, attempted audio and visual visit however there were difficulties with the audio portion and we discontinued and changed to audio only to complete the visit.  The patient was at home and the provider was in the office, alone.  We discussed the limitations of evaluation and management by telemedicine and the availability of in person appointments.  Patient consented for this telemedicine visit and is aware of possible charges for this service.  The other person participating in the telemedicine service was Marlon Pel, CMA who reviewed medications, allergies, past history and completed AVS.  Time spent on call: 16 minutes

## 2018-08-30 NOTE — Patient Instructions (Signed)
We have sent the following medications to your pharmacy for you to pick up at your convenience: dicyclomine.   Start over the [employer] once daily until symptoms improve.   Remain on a bland diet: avoiding milk products, caffeine, high fat foods, raw fruits, raw vegetables and spicy foods until symptoms resolve.   Resume your pantoprazole 40 mg twice daily.

## 2018-09-03 ENCOUNTER — Other Ambulatory Visit: Payer: Self-pay | Admitting: Family Medicine

## 2018-09-04 ENCOUNTER — Encounter: Payer: Self-pay | Admitting: Family Medicine

## 2018-09-04 ENCOUNTER — Other Ambulatory Visit (INDEPENDENT_AMBULATORY_CARE_PROVIDER_SITE_OTHER): Payer: Medicare Other

## 2018-09-04 ENCOUNTER — Telehealth: Payer: Self-pay | Admitting: Family Medicine

## 2018-09-04 DIAGNOSIS — R194 Change in bowel habit: Secondary | ICD-10-CM

## 2018-09-04 DIAGNOSIS — E039 Hypothyroidism, unspecified: Secondary | ICD-10-CM

## 2018-09-04 NOTE — Telephone Encounter (Signed)
Pt set up for lab visit today at 3 pm

## 2018-09-04 NOTE — Telephone Encounter (Signed)
See note

## 2018-09-04 NOTE — Telephone Encounter (Signed)
Copied from [place] [id number]. Topic: General - Other >> Sep 04, 2018 11:00 AM Pauline Good wrote: Reason for CRM: pt is wanting to have a Serology covid 19 Abiggia lab and Helicobacter pylorigg because her husband had it done also. She is having intestinal problems. To rule out Covid

## 2018-09-05 ENCOUNTER — Encounter: Payer: Self-pay | Admitting: Family Medicine

## 2018-09-05 LAB — COMPREHENSIVE METABOLIC PANEL
ALT: 26 U/L (ref 0–35)
AST: 19 U/L (ref 0–37)
Albumin: 4.2 g/dL (ref 3.5–5.2)
Alkaline Phosphatase: 46 U/L (ref 39–117)
BUN: 6 mg/dL (ref 6–23)
CO2: 25 mEq/L (ref 19–32)
Calcium: 9.5 mg/dL (ref 8.4–10.5)
Chloride: 96 mEq/L (ref 96–112)
Creatinine, Ser: 0.83 mg/dL (ref 0.40–1.20)
GFR: 67.52 mL/min (ref >60.00)
Glucose, Bld: 100 mg/dL — ABNORMAL HIGH (ref 70–99)
Potassium: 3.7 mEq/L (ref 3.5–5.1)
Sodium: 132 mEq/L — ABNORMAL LOW (ref 135–145)
Total Bilirubin: 1.1 mg/dL (ref 0.2–1.2)
Total Protein: 6.2 g/dL (ref 6.0–8.3)

## 2018-09-05 LAB — T4, FREE: Free T4: 1.46 ng/dL (ref 0.60–1.60)

## 2018-09-05 LAB — CBC WITH DIFFERENTIAL/PLATELET
Basophils Absolute: 0 10*3/uL (ref 0.0–0.1)
Basophils Relative: 0.6 % (ref 0.0–3.0)
Eosinophils Absolute: 0 10*3/uL (ref 0.0–0.7)
Eosinophils Relative: 0.3 % (ref 0.0–5.0)
HCT: 40.1 % (ref 36.0–46.0)
Hemoglobin: 14.2 g/dL (ref 12.0–15.0)
Lymphocytes Relative: 26.6 % (ref 12.0–46.0)
Lymphs Abs: 1.6 10*3/uL (ref 0.7–4.0)
MCHC: 35.5 g/dL (ref 30.0–36.0)
MCV: 91.2 fl (ref 78.0–100.0)
Monocytes Absolute: 0.5 10*3/uL (ref 0.1–1.0)
Monocytes Relative: 8.9 % (ref 3.0–12.0)
Neutro Abs: 3.9 10*3/uL (ref 1.4–7.7)
Neutrophils Relative %: 63.6 % (ref 43.0–77.0)
Platelets: 298 10*3/uL (ref 150.0–400.0)
RBC: 4.4 Mil/uL (ref 3.87–5.11)
RDW: 14 % (ref 11.5–15.5)
WBC: 6.2 10*3/uL (ref 4.0–10.5)

## 2018-09-05 LAB — H. PYLORI ANTIBODY, IGG: H Pylori IgG: NEGATIVE

## 2018-09-05 LAB — SAR COV2 SEROLOGY (COVID19)AB(IGG),IA: SARS CoV2 AB IGG: NEGATIVE

## 2018-09-05 LAB — TSH: TSH: 1.53 u[IU]/mL (ref 0.35–4.50)

## 2018-09-05 LAB — PTH, INTACT AND CALCIUM
Calcium: 9.9 mg/dL (ref 8.6–10.4)
PTH: 51 pg/mL (ref 14–64)

## 2018-09-05 LAB — LIPASE: Lipase: 15 U/L (ref 11.0–59.0)

## 2018-09-06 ENCOUNTER — Telehealth: Payer: Self-pay | Admitting: Gastroenterology

## 2018-09-06 ENCOUNTER — Telehealth: Payer: Self-pay | Admitting: Family Medicine

## 2018-09-06 MED ORDER — GABAPENTIN 100 MG PO CAPS: 100.0000 mg | ORAL_CAPSULE | Freq: Three times a day (TID) | ORAL | 3 refills | Status: DC

## 2018-09-06 NOTE — Telephone Encounter (Signed)
Copied from [place] [id number]. Topic: General - Other >> Sep 06, 2018  9:16 AM Leward Quan A wrote: Reason for CRM: Patient called to ask Dr [name] can she prescribe her Gabapentin please. Any questions call back # [phone]

## 2018-09-06 NOTE — Telephone Encounter (Signed)
Called pt, she confirmed that she did receive MyChart message from Dr. [name] saying that she has sent in Gabapentin. No Further action required at this time.

## 2018-09-06 NOTE — Telephone Encounter (Signed)
Pt called stating that she feels like her "stomach is on fire." States the bentyl constipated her but she also reports yesterday and today she had diarrhea. Pt also states she has not been taking any of her medicine for the past several days. States she just needs something for the pain. Discussed with her the communication with her PCP and something for nerve pain. Pt states she has not pursued that yet as she wanted to know if she could take it with her other meds. Let pt know that she should take the medications that Dr. Fuller Plan prescribed and contact her PCP regarding nerve pain med. Discussed with her that her hydrocodone will cause constipation. Also let pt know that Dr. Fuller Plan will not prescribe pain medication. Pt verbalized understanding.

## 2018-09-07 ENCOUNTER — Encounter: Payer: Self-pay | Admitting: Family Medicine

## 2018-09-08 MED ORDER — GABAPENTIN 100 MG PO CAPS: ORAL_CAPSULE | ORAL | Status: DC

## 2018-09-08 NOTE — Telephone Encounter (Signed)
Called pt and advised. Med list updated.

## 2018-09-11 ENCOUNTER — Ambulatory Visit: Payer: Medicare Other | Admitting: Physician Assistant

## 2018-09-11 ENCOUNTER — Encounter: Payer: Self-pay | Admitting: Family Medicine

## 2018-09-11 ENCOUNTER — Other Ambulatory Visit: Payer: Self-pay

## 2018-09-11 ENCOUNTER — Ambulatory Visit (INDEPENDENT_AMBULATORY_CARE_PROVIDER_SITE_OTHER): Payer: Medicare Other | Admitting: Family Medicine

## 2018-09-11 DIAGNOSIS — R194 Change in bowel habit: Secondary | ICD-10-CM

## 2018-09-11 DIAGNOSIS — M545 Low back pain, unspecified: Secondary | ICD-10-CM

## 2018-09-11 DIAGNOSIS — R1010 Upper abdominal pain, unspecified: Secondary | ICD-10-CM | POA: Diagnosis not present

## 2018-09-11 DIAGNOSIS — M546 Pain in thoracic spine: Secondary | ICD-10-CM | POA: Diagnosis not present

## 2018-09-11 DIAGNOSIS — H04123 Dry eye syndrome of bilateral lacrimal glands: Secondary | ICD-10-CM

## 2018-09-11 MED ORDER — CARBOXYMETHYLCELLULOSE SODIUM 1 % OP SOLN: 1.0000 [drp] | Freq: Three times a day (TID) | OPHTHALMIC | 12 refills | Status: DC

## 2018-09-11 NOTE — Progress Notes (Signed)
Virtual Visit via Video   Due to the COVID-19 pandemic, this visit was completed with telemedicine (audio/video) technology to reduce patient and provider exposure as well as to preserve personal protective equipment.   I connected with Latasha Mclaughlin by a video enabled telemedicine application and verified that I am speaking with the correct person using two identifiers. Location patient: Home Location provider: [facility], Office Persons participating in the virtual visit: Maecy, Podgurski, DO   I discussed the limitations of evaluation and management by telemedicine and the availability of in person appointments. The patient expressed understanding and agreed to proceed.  Care Team   Patient Care Team: Briscoe Deutscher, DO as PCP - General (Family Medicine)  Subjective:   HPI: Patient continues to have pain that starts in her epigastrium and radiates to her pain and up between her shoulder blades. Associated with feeling of hot flashes. No medications have helped except for Gabapentin, currently at 200 mg in am, 100 mg at noon, 100 mg at night. Working her way up to 200 mg TID. Previous CT at ED showed large hiatal hernia. Patient had telephone visit with her GI, Dr. Fuller Plan. GI did not think that the pain was from the hernia, stating that it was likely from IBS. Patient does not think that she has IBS pain. Never took prescribed Bentyl. She stopped all other medications for dyspepsia and has not noticed a difference.   Had antibiotics during course of illness. Now, with green to yellow, slimy, smelly, non-painful stools in the morning. Losing weight. Trying to stay hydrated.   Complains of very dry eyes. Affecting vision.  Review of Systems  Constitutional: Positive for malaise/fatigue and weight loss. Negative for chills and fever.  Respiratory: Negative for cough, shortness of breath and wheezing.   Cardiovascular: Negative for chest pain, palpitations and leg  swelling.  Gastrointestinal: Positive for abdominal pain and diarrhea. Negative for constipation, nausea and vomiting.  Genitourinary: Negative for dysuria and urgency.  Musculoskeletal: Positive for back pain.  Skin: Negative for rash.  Neurological: Positive for tingling. Negative for dizziness and headaches.  Psychiatric/Behavioral: Positive for depression. Negative for substance abuse and suicidal ideas. The patient is nervous/anxious.     Patient Active Problem List   Diagnosis Date Noted  . History of colonic polyps 11/18/2017  . Change in bowel habits 11/18/2017  . Abdominal pain, epigastric 11/18/2017  . History of hyperparathyroidism 01/08/2017  . Myalgia 01/05/2017  . Arthralgia 01/05/2017  . Fatigue 01/05/2017  . Paresthesia 01/05/2017  . Overweight (BMI 25.0-29.9) 12/16/2016  . Hypothyroidism 12/15/2016  . Hyperlipidemia 12/15/2016    Social History   Tobacco Use  . Smoking status: Former Research scientist (life sciences)  . Smokeless tobacco: Never Used  . Tobacco comment: Quit in 1985  Substance Use Topics  . Alcohol use: No   Current Outpatient Medications:  .  Cholecalciferol (VITAMIN D) 50 MCG (2000 UT) tablet, Take 2,000 Units by mouth daily., Disp: , Rfl:  .  gabapentin (NEURONTIN) 100 MG capsule, Take 1-2 tablets TID prn or as directed, Disp: , Rfl:  .  SYNTHROID 100 MCG tablet, TAKE 1 TABLET BY MOUTH EVERY THIRD DAY IN A 3 DAY CYCLE (Patient taking differently: Take 100 mcg by mouth as directed. TAKE 172mcg BY MOUTH EVERY THIRD DAY IN A 3 DAY CYCLE), Disp: 60 tablet, Rfl: 0 .  SYNTHROID 88 MCG tablet, Take 1 tablet (88 mcg total) by mouth as directed. Take 79mcg every first and second day in  a 3 day cycle for 90 days.  MUST HAVE BRAND NAME SYNTHROID, Disp: 180 tablet, Rfl: 1  Allergies  Allergen Reactions  . Prevacid [Lansoprazole] Rash  . Doxycycline Other (See Comments)    Burning     Objective:   VITALS: Per patient if applicable, see vitals. GENERAL: Alert, appears well  and in no acute distress. HEENT: Atraumatic, conjunctiva clear, no obvious abnormalities on inspection of external nose and ears. NECK: Normal movements of the head and neck. CARDIOPULMONARY: No increased WOB. Speaking in clear sentences. I:E ratio WNL.  MS: Moves all visible extremities without noticeable abnormality. PSYCH: Pleasant and cooperative, well-groomed. Speech normal rate and rhythm. Affect is appropriate. Insight and judgement are appropriate. Attention is focused, linear, and appropriate.  NEURO: CN grossly intact. Oriented as arrived to appointment on time with no prompting. Moves both UE equally.  SKIN: No obvious lesions, wounds, erythema, or cyanosis noted on face or hands.  Depression screen [facility] 2/9 12/16/2017 12/15/2016  Decreased Interest 0 0  Down, Depressed, Hopeless 0 0  PHQ - 2 Score 0 0    Assessment and Plan:   Diagnoses and all orders for this visit:  Intractable upper abdominal pain Comments: Will ask Gen Surgery to evaluate. Large hiatal hernia. If GS does not think that this is due to abdominal etiology, will pursue MSK.  Orders: -     Ambulatory referral to General Surgery  Change in bowel habits Comments: Likely C. diff.  Orders: -     Stool Culture -     C. difficile GDH and Toxin A/B -     Ova and parasite examination  Back pain of thoracolumbar region Comments: With bulging disc at T11 per patient. Hx of injections.  Orders: -     Ambulatory referral to Orthopedics  Dry eye syndrome of both eyes -     carboxymethylcellulose 1 % ophthalmic solution; Apply 1 drop to eye 3 (three) times daily.   Marland Kitchen COVID-19 Education: The signs and symptoms of COVID-19 were discussed with the patient and how to seek care for testing if needed. The importance of social distancing was discussed today. . Reviewed expectations re: course of current medical issues. . Discussed self-management of symptoms. . Outlined signs and symptoms indicating need for more  acute intervention. . Patient verbalized understanding and all questions were answered. Marland Kitchen Health Maintenance issues including appropriate healthy diet, exercise, and smoking avoidance were discussed with patient. . See orders for this visit as documented in the electronic medical record.  Briscoe Deutscher, DO  Records requested if needed. Time spent: 25 minutes, of which >50% was spent in obtaining information about her symptoms, reviewing her previous labs, evaluations, and treatments, counseling her about her condition (please see the discussed topics above), and developing a plan to further investigate it; she had a number of questions which I addressed.

## 2018-09-12 ENCOUNTER — Encounter: Payer: Self-pay | Admitting: Family Medicine

## 2018-09-13 ENCOUNTER — Encounter: Payer: Self-pay | Admitting: Family Medicine

## 2018-09-13 ENCOUNTER — Other Ambulatory Visit: Payer: Self-pay

## 2018-09-13 MED ORDER — GABAPENTIN 100 MG PO CAPS: ORAL_CAPSULE | ORAL | 1 refills | Status: DC

## 2018-09-14 ENCOUNTER — Other Ambulatory Visit: Payer: Medicare Other

## 2018-09-14 DIAGNOSIS — R194 Change in bowel habit: Secondary | ICD-10-CM | POA: Diagnosis not present

## 2018-09-14 DIAGNOSIS — K449 Diaphragmatic hernia without obstruction or gangrene: Secondary | ICD-10-CM | POA: Diagnosis not present

## 2018-09-15 ENCOUNTER — Encounter: Payer: Self-pay | Admitting: Family Medicine

## 2018-09-18 ENCOUNTER — Encounter: Payer: Self-pay | Admitting: Family Medicine

## 2018-09-18 LAB — C. DIFFICILE GDH AND TOXIN A/B
GDH ANTIGEN: NOT DETECTED
MICRO NUMBER:: 474863
SPECIMEN QUALITY:: ADEQUATE
TOXIN A AND B: NOT DETECTED

## 2018-09-18 LAB — OVA AND PARASITE EXAMINATION
CONCENTRATE RESULT:: NONE SEEN
MICRO NUMBER:: 475872
SPECIMEN QUALITY:: ADEQUATE
TRICHROME RESULT:: NONE SEEN

## 2018-09-18 LAB — STOOL CULTURE
MICRO NUMBER:: 474892
MICRO NUMBER:: 474893
MICRO NUMBER:: 474894
SHIGA RESULT:: NOT DETECTED
SPECIMEN QUALITY:: ADEQUATE
SPECIMEN QUALITY:: ADEQUATE
SPECIMEN QUALITY:: ADEQUATE

## 2018-09-19 ENCOUNTER — Telehealth: Payer: Self-pay | Admitting: Family Medicine

## 2018-09-19 ENCOUNTER — Encounter: Payer: Self-pay | Admitting: Family Medicine

## 2018-09-19 NOTE — Telephone Encounter (Signed)
Copied from [place] [id number]. Topic: General - Inquiry >> Sep 19, 2018  9:31 AM Latasha Mclaughlin, NT wrote: Reason for CRM: Patient is calling in stating that she would like a call back from Dr.Wallace as well as someone to call her with her stool sample results. Call back is [phone].

## 2018-09-19 NOTE — Telephone Encounter (Signed)
All stool studies are normal

## 2018-09-19 NOTE — Telephone Encounter (Signed)
Called patient let her know that I will call as soon as results are reviewed. She also has a my chart open to you as well.

## 2018-09-19 NOTE — Telephone Encounter (Signed)
Called pt and advised.  

## 2018-09-20 ENCOUNTER — Other Ambulatory Visit: Payer: Self-pay

## 2018-09-20 ENCOUNTER — Encounter: Payer: Self-pay | Admitting: Family Medicine

## 2018-09-20 DIAGNOSIS — R202 Paresthesia of skin: Secondary | ICD-10-CM

## 2018-09-20 NOTE — Telephone Encounter (Signed)
Will add to another message to decrease confusion.

## 2018-09-21 ENCOUNTER — Other Ambulatory Visit: Payer: Self-pay

## 2018-09-21 ENCOUNTER — Encounter: Payer: Self-pay | Admitting: Family Medicine

## 2018-09-21 MED ORDER — LIDOCAINE VISCOUS HCL 2 % MT SOLN: OROMUCOSAL | 0 refills | Status: DC

## 2018-09-21 MED ORDER — METRONIDAZOLE 500 MG PO TABS: 500.0000 mg | ORAL_TABLET | Freq: Three times a day (TID) | ORAL | 0 refills | Status: DC

## 2018-09-26 ENCOUNTER — Encounter: Payer: Self-pay | Admitting: Family Medicine

## 2018-09-27 ENCOUNTER — Encounter: Payer: Self-pay | Admitting: Family Medicine

## 2018-09-29 ENCOUNTER — Ambulatory Visit: Payer: Medicare Other | Admitting: Gastroenterology

## 2018-09-29 ENCOUNTER — Other Ambulatory Visit: Payer: Self-pay

## 2018-09-29 ENCOUNTER — Ambulatory Visit (AMBULATORY_SURGERY_CENTER): Payer: Medicare Other | Admitting: *Deleted

## 2018-09-29 VITALS — Ht 66.0 in | Wt 172.6 lb

## 2018-09-29 DIAGNOSIS — Z86010 Personal history of colonic polyps: Secondary | ICD-10-CM

## 2018-09-29 MED ORDER — PEG 3350-KCL-NA BICARB-NACL 420 G PO SOLR: 4000.0000 mL | Freq: Once | ORAL | 0 refills | Status: AC

## 2018-09-29 NOTE — Progress Notes (Signed)
No egg or soy allergy known to patient  No issues with past sedation with any surgeries  or procedures, no intubation problems  No diet pills per patient No home 02 use per patient  No blood thinners per patient  Pt denies issues with constipation  No A fib or A flutter     Pt verified name, DOB, address and insurance during PV today. Pt mailed instruction packet to included paper to complete and mail back to [facility] with addressed and stamped envelope,  copy of consent form to read and not return, and instructions.  PV completed over the phone. Pt encouraged to call with questions or issues  

## 2018-10-03 ENCOUNTER — Encounter: Payer: Self-pay | Admitting: Family Medicine

## 2018-10-04 ENCOUNTER — Telehealth: Payer: Self-pay | Admitting: *Deleted

## 2018-10-04 NOTE — Telephone Encounter (Signed)

## 2018-10-06 ENCOUNTER — Encounter: Payer: Self-pay | Admitting: Gastroenterology

## 2018-10-06 ENCOUNTER — Ambulatory Visit (AMBULATORY_SURGERY_CENTER): Payer: Medicare Other | Admitting: Gastroenterology

## 2018-10-06 ENCOUNTER — Other Ambulatory Visit: Payer: Self-pay

## 2018-10-06 VITALS — BP 113/66 | HR 60 | Temp 98.1°F | Resp 18 | Ht 66.0 in | Wt 177.0 lb

## 2018-10-06 DIAGNOSIS — R194 Change in bowel habit: Secondary | ICD-10-CM

## 2018-10-06 DIAGNOSIS — K635 Polyp of colon: Secondary | ICD-10-CM | POA: Diagnosis not present

## 2018-10-06 DIAGNOSIS — K648 Other hemorrhoids: Secondary | ICD-10-CM

## 2018-10-06 DIAGNOSIS — K644 Residual hemorrhoidal skin tags: Secondary | ICD-10-CM | POA: Diagnosis not present

## 2018-10-06 DIAGNOSIS — D123 Benign neoplasm of transverse colon: Secondary | ICD-10-CM | POA: Diagnosis not present

## 2018-10-06 DIAGNOSIS — R1030 Lower abdominal pain, unspecified: Secondary | ICD-10-CM

## 2018-10-06 HISTORY — PX: COLONOSCOPY: SHX174

## 2018-10-06 MED ORDER — SODIUM CHLORIDE 0.9 % IV SOLN: 500.0000 mL | Freq: Once | INTRAVENOUS | Status: DC

## 2018-10-06 NOTE — Op Note (Addendum)
[facility] Patient Name: Latasha Mclaughlin Procedure Date: 10/06/2018 3:32 PM MRN: [id number] Endoscopist: Ladene Artist , MD Age: 73 Referring MD:  Date of Birth: 16-Jun-1945 Gender: Female Account #: [id number] Procedure:                Colonoscopy Indications:              Lower abdominal pain, Change in bowel habits Medicines:                Monitored Anesthesia Care Procedure:                Pre-Anesthesia Assessment:                           - Prior to the procedure, a History and Physical                            was performed, and patient medications and                            allergies were reviewed. The patient's tolerance of                            previous anesthesia was also reviewed. The risks                            and benefits of the procedure and the sedation                            options and risks were discussed with the patient.                            All questions were answered, and informed consent                            was obtained. Prior Anticoagulants: The patient has                            taken no previous anticoagulant or antiplatelet                            agents. ASA Grade Assessment: II - A patient with                            mild systemic disease. After reviewing the risks                            and benefits, the patient was deemed in                            satisfactory condition to undergo the procedure.                           After obtaining informed consent, the colonoscope  was passed under direct vision. Throughout the                            procedure, the patient's blood pressure, pulse, and                            oxygen saturations were monitored continuously. The                            Colonoscope was introduced through the anus and                            advanced to the the terminal ileum, with                            identification of the  appendiceal orifice and IC                            valve. The ileocecal valve, appendiceal orifice,                            and rectum were photographed. The quality of the                            bowel preparation was good. The colonoscopy was                            performed without difficulty. The patient tolerated                            the procedure well. Scope In: 3:56:13 PM Scope Out: 4:10:50 PM Scope Withdrawal Time: 0 hours 12 minutes 4 seconds  Total Procedure Duration: 0 hours 14 minutes 37 seconds  Findings:                 The perianal and digital rectal examinations were                            normal.                           The terminal ileum appeared normal.                           There was a medium-sized lipoma, 12 mm in diameter,                            in the ascending colon.                           Two sessile polyps were found in the transverse                            colon. The polyps were 7 mm in size. These polyps  were removed with a cold snare. Resection and                            retrieval were complete.                           External hemorrhoids were found during                            retroflexion. The hemorrhoids were medium-sized.                           The exam was otherwise without abnormality on                            direct and retroflexion views. Complications:            No immediate complications. Estimated blood loss:                            None. Estimated Blood Loss:     Estimated blood loss: none. 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                           - Two 7 mm polyps in the transverse colon, removed                            with a cold snare. Resected and retrieved.                           - External hemorrhoids.                           - The examination was  otherwise normal on direct                            and retroflexion views. Recommendation:           - Repeat colonoscopy in 5 years for surveillance.                           - Patient has a contact number available for                            emergencies. The signs and symptoms of potential                            delayed complications were discussed with the                            patient. Return to normal activities tomorrow.  Written discharge instructions were provided to the                            patient.                           - Resume previous diet.                           - Continue present medications.                           - Await pathology results.                           - She reports that her burning lower abdominal pain                            and back pain is relieved with gabapentin. No                            further GI evaluation at this time. Follow up with                            Dr. [name]. Ladene Artist, MD 10/06/2018 4:17:45 PM This report has been signed electronically.

## 2018-10-06 NOTE — Telephone Encounter (Signed)
Per referral note 09/21/18 Forwarded For Review with Dr. Carles Collet. Thank you  Referral was already assigned as urgent.   Brandy/CMA

## 2018-10-06 NOTE — Progress Notes (Signed)
Report given to PACU, vss 

## 2018-10-06 NOTE — Patient Instructions (Addendum)
Impression/Recommendations:  Polyp handout given to patient. Hemorrhoid handout given to patient.  Repeat colonoscopy in 5 years for surveillance.  Resume previous diet. Continue present medications.  Await pathology results.  Follow up with Dr. [name].  YOU HAD AN ENDOSCOPIC PROCEDURE TODAY AT [facility]:   Refer to the procedure report that was given to you for any specific questions about what was found during the examination.  If the procedure report does not answer your questions, please call your gastroenterologist to clarify.  If you requested that your care partner not be given the details of your procedure findings, then the procedure report has been included in a sealed envelope for you to review at your convenience later.  YOU SHOULD EXPECT: Some feelings of bloating in the abdomen. Passage of more gas than usual.  Walking can help get rid of the air that was put into your GI tract during the procedure and reduce the bloating. If you had a lower endoscopy (such as a colonoscopy or flexible sigmoidoscopy) you may notice spotting of blood in your stool or on the toilet paper. If you underwent a bowel prep for your procedure, you may not have a normal bowel movement for a few days.  Please Note:  You might notice some irritation and congestion in your nose or some drainage.  This is from the oxygen used during your procedure.  There is no need for concern and it should clear up in a day or so.  SYMPTOMS TO REPORT IMMEDIATELY:   Following lower endoscopy (colonoscopy or flexible sigmoidoscopy):  Excessive amounts of blood in the stool  Significant tenderness or worsening of abdominal pains  Swelling of the abdomen that is new, acute  Fever of 100F or higher For urgent or emergent issues, a gastroenterologist can be reached at any hour by calling [phone].   DIET:  We do recommend a small meal at first, but then you may proceed to your regular diet.   Drink plenty of fluids but you should avoid alcoholic beverages for 24 hours.  ACTIVITY:  You should plan to take it easy for the rest of today and you should NOT DRIVE or use heavy machinery until tomorrow (because of the sedation medicines used during the test).    FOLLOW UP: Our staff will call the number listed on your records 48-72 hours following your procedure to check on you and address any questions or concerns that you may have regarding the information given to you following your procedure. If we do not reach you, we will leave a message.  We will attempt to reach you two times.  During this call, we will ask if you have developed any symptoms of COVID 19. If you develop any symptoms (ie: fever, flu-like symptoms, shortness of breath, cough etc.) before then, please call [phone].  If you test positive for Covid 19 in the 2 weeks post procedure, please call and report this information to [place].    If any biopsies were taken you will be contacted by phone or by letter within the next 1-3 weeks.  Please call us at [phone] if you have not heard about the biopsies in 3 weeks.    SIGNATURES/CONFIDENTIALITY: You and/or your care partner have signed paperwork which will be entered into your electronic medical record.  These signatures attest to the fact that that the information above on your After Visit Summary has been reviewed and is understood.  Full responsibility of the confidentiality of  this discharge information lies with you and/or your care-partner.

## 2018-10-10 ENCOUNTER — Telehealth: Payer: Self-pay

## 2018-10-10 ENCOUNTER — Encounter: Payer: Self-pay | Admitting: Family Medicine

## 2018-10-10 NOTE — Telephone Encounter (Signed)
  Follow up Call-  Call back number 10/06/2018 01/10/2018  Post procedure Call Back phone  # [phone]  Permission to leave phone message Yes Yes  Some recent data might be hidden     Patient questions:  Do you have a fever, pain , or abdominal swelling? No. Pain Score  0 *  Have you tolerated food without any problems? Yes.    Have you been able to return to your normal activities? Yes.    Do you have any questions about your discharge instructions: Diet   No. Medications  No. Follow up visit  No.  Do you have questions or concerns about your Care? No.  Actions: * If pain score is 4 or above: 1. No action needed, pain <4.Have you developed a fever since your procedure? no  2.   Have you had an respiratory symptoms (SOB or cough) since your procedure? no  3.   Have you tested positive for COVID 19 since your procedure no  4.   Have you had any family members/close contacts diagnosed with the COVID 19 since your procedure?  no   If yes to any of these questions please route to Joylene John, RN and Alphonsa Gin, Therapist, sports.

## 2018-10-10 NOTE — Telephone Encounter (Signed)
Are you able to call and follow up on that?

## 2018-10-12 ENCOUNTER — Encounter: Payer: Self-pay | Admitting: Family Medicine

## 2018-10-12 ENCOUNTER — Telehealth: Payer: Self-pay

## 2018-10-12 NOTE — Telephone Encounter (Signed)
Copied from [place] [id number]. Topic: Quick Sport and exercise psychologist Patient (Clinic Use ONLY) >> Oct 12, 2018  2:56 PM Lennox Solders wrote: Reason for CRM:pt is return joellen call from yesterday per patient

## 2018-10-13 NOTE — Telephone Encounter (Signed)
Called and spoke to pt, told her Dr. [name] is out of the office today, but Aldona Bar reviewed your My chart message and would like to schedule you for a virtual visit on Monday with her due to Dr. [name] is full. Pt said she would like Dr. [name]. Appt scheduled for Tuesday. Asked pt if she has been checking her sugars? Pt said yes. Asked her what they have been running. Pt said 102-127 yesterday and this AM 108. Asked pt if she is drinking enough water. Pt said yes,but still thirsty, thinks it may be from her medications. Denies headaches, dizziness. Told pt to continue to monitor sugars till she sees Dr. [name], if 51 or below call office or go to the ED. Pt verbalized understanding.

## 2018-10-16 ENCOUNTER — Encounter: Payer: Self-pay | Admitting: Gastroenterology

## 2018-10-16 NOTE — Progress Notes (Deleted)
Virtual Visit via Video   Due to the COVID-19 pandemic, this visit was completed with telemedicine (audio/video) technology to reduce patient and provider exposure as well as to preserve personal protective equipment.   I connected with Ara Grandmaison Apachito by a video enabled telemedicine application and verified that I am speaking with the correct person using two identifiers. Location patient: Home Location provider: [facility], Office Persons participating in the virtual visit: Cailynn, Bodnar, DO   I discussed the limitations of evaluation and management by telemedicine and the availability of in person appointments. The patient expressed understanding and agreed to proceed.  Care Team   Patient Care Team: Briscoe Deutscher, DO as PCP - General (Family Medicine)  Subjective:   HPI:   ROS   Patient Active Problem List   Diagnosis Date Noted  . History of colonic polyps 11/18/2017  . Change in bowel habits 11/18/2017  . Abdominal pain, epigastric 11/18/2017  . History of hyperparathyroidism 01/08/2017  . Myalgia 01/05/2017  . Arthralgia 01/05/2017  . Fatigue 01/05/2017  . Paresthesia 01/05/2017  . Overweight (BMI 25.0-29.9) 12/16/2016  . Hypothyroidism 12/15/2016  . Hyperlipidemia 12/15/2016    Social History   Tobacco Use  . Smoking status: Former Research scientist (life sciences)  . Smokeless tobacco: Never Used  . Tobacco comment: Quit in 1985  Substance Use Topics  . Alcohol use: No    Current Outpatient Medications:  .  carboxymethylcellulose 1 % ophthalmic solution, Apply 1 drop to eye 3 (three) times daily. (Patient not taking: Reported on 09/29/2018), Disp: 30 mL, Rfl: 12 .  Cholecalciferol (VITAMIN D) 50 MCG (2000 UT) tablet, Take 2,000 Units by mouth daily., Disp: , Rfl:  .  gabapentin (NEURONTIN) 100 MG capsule, Take 1-3 tablets TID prn or as directed, Disp: 270 capsule, Rfl: 1 .  levothyroxine (SYNTHROID) 100 MCG tablet, TAKE 1 TABLET BY MOUTH EVERY THIRD DAY IN A 3 DAY  CYCLE (Patient taking differently: Take 100 mcg by mouth as directed. TAKE 1101mcg BY MOUTH EVERY THIRD DAY IN A 3 DAY CYCLE), Disp: 60 tablet, Rfl: 0 .  lidocaine (XYLOCAINE) 2 % solution, 1 tablespoon mixed with one tablespoon of maalox at bedtime (Patient not taking: Reported on 09/29/2018), Disp: 100 mL, Rfl: 0 .  pantoprazole (PROTONIX) 40 MG tablet, Take 40 mg by mouth daily., Disp: , Rfl:  .  Peppermint Oil (IBGARD PO), Take by mouth. TAKE 2 BEFORE EVERY MEAL, Disp: , Rfl:  .  rosuvastatin (CRESTOR) 10 MG tablet, Take 10 mg by mouth daily., Disp: , Rfl:  .  SYNTHROID 88 MCG tablet, Take 1 tablet (88 mcg total) by mouth as directed. Take 42mcg every first and second day in a 3 day cycle for 90 days.  MUST HAVE BRAND NAME SYNTHROID, Disp: 180 tablet, Rfl: 1  Current Facility-Administered Medications:  .  0.9 %  sodium chloride infusion, 500 mL, Intravenous, Once, Ladene Artist, MD  Allergies  Allergen Reactions  . Prevacid [Lansoprazole] Rash  . Doxycycline Other (See Comments)    Burning     Objective:   VITALS: Per patient if applicable, see vitals. GENERAL: Alert, appears well and in no acute distress. HEENT: Atraumatic, conjunctiva clear, no obvious abnormalities on inspection of external nose and ears. NECK: Normal movements of the head and neck. CARDIOPULMONARY: No increased WOB. Speaking in clear sentences. I:E ratio WNL.  MS: Moves all visible extremities without noticeable abnormality. PSYCH: Pleasant and cooperative, well-groomed. Speech normal rate and rhythm. Affect is appropriate.  Insight and judgement are appropriate. Attention is focused, linear, and appropriate.  NEURO: CN grossly intact. Oriented as arrived to appointment on time with no prompting. Moves both UE equally.  SKIN: No obvious lesions, wounds, erythema, or cyanosis noted on face or hands.  Depression screen [facility] 2/9 12/16/2017 12/15/2016  Decreased Interest 0 0  Down, Depressed, Hopeless 0 0  PHQ - 2  Score 0 0    Assessment and Plan:   There are no diagnoses linked to this encounter.  Marland Kitchen COVID-19 Education: The signs and symptoms of COVID-19 were discussed with the patient and how to seek care for testing if needed. The importance of social distancing was discussed today. . Reviewed expectations re: course of current medical issues. . Discussed self-management of symptoms. . Outlined signs and symptoms indicating need for more acute intervention. . Patient verbalized understanding and all questions were answered. Marland Kitchen Health Maintenance issues including appropriate healthy diet, exercise, and smoking avoidance were discussed with patient. . See orders for this visit as documented in the electronic medical record.  Briscoe Deutscher, DO  Records requested if needed. Time spent: *** minutes, of which >50% was spent in obtaining information about her symptoms, reviewing her previous labs, evaluations, and treatments, counseling her about her condition (please see the discussed topics above), and developing a plan to further investigate it; she had a number of questions which I addressed.

## 2018-10-17 ENCOUNTER — Ambulatory Visit: Payer: Medicare Other | Admitting: Family Medicine

## 2018-10-17 ENCOUNTER — Encounter: Payer: Self-pay | Admitting: Family Medicine

## 2018-10-17 ENCOUNTER — Telehealth: Payer: Self-pay | Admitting: Physical Therapy

## 2018-10-17 NOTE — Telephone Encounter (Signed)
Patient has neuro app on 6/19 and office visit with [place] tomorrow.

## 2018-10-17 NOTE — Telephone Encounter (Signed)
Printed for app

## 2018-10-17 NOTE — Patient Instructions (Signed)
I would also like for you to sign up for an annual wellness visit with one of our nurses, Cassie or Manuela Schwartz, who both specialize in the annual wellness visit. This is a free benefit under medicare that may help [place] find additional ways to help you. Some highlights are reviewing medications, lifestyle, and doing a dementia screen.

## 2018-10-17 NOTE — Telephone Encounter (Signed)
Copied from [place] [id number]. Topic: General - Other >> Oct 17, 2018  3:28 PM Yvette Rack wrote: Reason for CRM: Pt requests that Joellen call her back. Cb# [phone]

## 2018-10-17 NOTE — Progress Notes (Signed)
Virtual Visit via Video   Due to the COVID-19 pandemic, this visit was completed with telemedicine (audio/video) technology to reduce patient and provider exposure as well as to preserve personal protective equipment.   I connected with Latasha Mclaughlin by a video enabled telemedicine application and verified that I am speaking with the correct person using two identifiers. Location patient: Home Location provider: [facility], Office Persons participating in the virtual visit: Latasha Mclaughlin, Ahmed, DO Latasha Mclaughlin, CMA acting as scribe for Dr. Briscoe Deutscher.   I discussed the limitations of evaluation and management by telemedicine and the availability of in person appointments. The patient expressed understanding and agreed to proceed.  Care Team   Patient Care Team: Briscoe Deutscher, DO as PCP - General (Family Medicine)  Subjective:   HPI:   Patient has sent in list of blood sugar readings to review today. Those have been printed and reviewed today in office. She has been checking due to numbness and tingling in her feet. She has been very thirsty so she started checking.   Sore throat: Started over the weekend. She started taking over the counter Childrens allergy medications (she cant take adult due to HR) It has gotten worse and now has spots on back of throat and a "coating in her mouth and throat". She has not had any fever or cough. She is having b/l ear pain and fullness.   Review of Systems  Constitutional: Negative for chills and fever.  HENT: Positive for ear pain. Negative for hearing loss.   Eyes: Negative for blurred vision and double vision.  Respiratory: Negative for cough and wheezing.   Cardiovascular: Negative for chest pain, palpitations and leg swelling.  Gastrointestinal: Negative for nausea and vomiting.  Genitourinary: Negative for dysuria and urgency.  Musculoskeletal: Negative for back pain and neck pain.  Neurological: Positive for  tingling. Negative for dizziness and headaches.       On feet and legs   Psychiatric/Behavioral: Negative for depression and suicidal ideas.    Patient Active Problem List   Diagnosis Date Noted  . Vitamin B12 deficiency 10/20/2018  . History of colonic polyps 11/18/2017  . Change in bowel habits 11/18/2017  . Abdominal pain, epigastric 11/18/2017  . History of hyperparathyroidism 01/08/2017  . Myalgia 01/05/2017  . Arthralgia 01/05/2017  . Fatigue 01/05/2017  . Paresthesia 01/05/2017  . Overweight (BMI 25.0-29.9) 12/16/2016  . Hypothyroidism 12/15/2016  . Hyperlipidemia 12/15/2016    Social History   Tobacco Use  . Smoking status: Former Research scientist (life sciences)  . Smokeless tobacco: Never Used  . Tobacco comment: Quit in 1985  Substance Use Topics  . Alcohol use: No   Current Outpatient Medications:  .  carboxymethylcellulose 1 % ophthalmic solution, Apply 1 drop to eye 3 (three) times daily. (Patient not taking: Reported on 09/29/2018), Disp: 30 mL, Rfl: 12 .  Cholecalciferol (VITAMIN D) 50 MCG (2000 UT) tablet, Take 2,000 Units by mouth daily., Disp: , Rfl:  .  gabapentin (NEURONTIN) 100 MG capsule, Take 1-3 tablets TID prn or as directed, Disp: 270 capsule, Rfl: 1 .  levothyroxine (SYNTHROID) 100 MCG tablet, TAKE 1 TABLET BY MOUTH EVERY THIRD DAY IN A 3 DAY CYCLE (Patient taking differently: Take 100 mcg by mouth as directed. TAKE 176mcg BY MOUTH EVERY THIRD DAY IN A 3 DAY CYCLE), Disp: 60 tablet, Rfl: 0 .  pantoprazole (PROTONIX) 40 MG tablet, Take 40 mg by mouth daily., Disp: , Rfl:  .  Peppermint Oil (IBGARD  PO), Take by mouth. TAKE 2 BEFORE EVERY MEAL, Disp: , Rfl:  .  rosuvastatin (CRESTOR) 10 MG tablet, Take 10 mg by mouth daily., Disp: , Rfl:  .  SYNTHROID 88 MCG tablet, Take 1 tablet (88 mcg total) by mouth as directed. Take 43mcg every first and second day in a 3 day cycle for 90 days.  MUST HAVE BRAND NAME SYNTHROID, Disp: 180 tablet, Rfl: 1  Allergies  Allergen Reactions  .  Prevacid [Lansoprazole] Rash  . Doxycycline Other (See Comments)    Burning     Objective:   VITALS: Per patient if applicable, see vitals. GENERAL: Alert, appears well and in no acute distress. HEENT: Atraumatic, conjunctiva clear, no obvious abnormalities on inspection of external nose and ears. NECK: Normal movements of the head and neck. CARDIOPULMONARY: No increased WOB. Speaking in clear sentences. I:E ratio WNL.  MS: Moves all visible extremities without noticeable abnormality. PSYCH: Pleasant and cooperative, well-groomed. Speech normal rate and rhythm. Affect is appropriate. Insight and judgement are appropriate. Attention is focused, linear, and appropriate.  NEURO: CN grossly intact. Oriented as arrived to appointment on time with no prompting. Moves both UE equally.  SKIN: No obvious lesions, wounds, erythema, or cyanosis noted on face or hands.  Depression screen [facility] 2/9 12/16/2017 12/15/2016  Decreased Interest 0 0  Down, Depressed, Hopeless 0 0  PHQ - 2 Score 0 0    Assessment and Plan:   [name] was seen today for sore throat.  Diagnoses and all orders for this visit:  Numbness -     Folate -     Vitamin B12 -     ANA -     ANA w/Reflex  Other fatigue -     CBC with Differential/Platelet -     Hemoglobin A1c -     Magnesium -     Folate -     Iron, TIBC and Ferritin Panel -     Comprehensive metabolic panel -     TSH  Sore throat -     Folate -     POCT rapid strep A -     Diphenhyd-Hydrocort-Nystatin (FIRST-DUKES MOUTHWASH) SUSP; Swish and spit 5 ml bid as needed.  Myalgia -     Magnesium -     Folate -     Cancel: ANA w/Reflex  Hypoglycemia -     Hemoglobin A1c -     Folate  Exposure to SARS virus -     SAR CoV2 Serology (COVID 19)AB(IGG)IA    . COVID-19 Education: The signs and symptoms of COVID-19 were discussed with the patient and how to seek care for testing if needed. The importance of social distancing was discussed  today. . Reviewed expectations re: course of current medical issues. . Discussed self-management of symptoms. . Outlined signs and symptoms indicating need for more acute intervention. . Patient verbalized understanding and all questions were answered. Marland Kitchen Health Maintenance issues including appropriate healthy diet, exercise, and smoking avoidance were discussed with patient. . See orders for this visit as documented in the electronic medical record.  Briscoe Deutscher, DO  Records requested if needed. Time spent: 25 minutes, of which >50% was spent in obtaining information about her symptoms, reviewing her previous labs, evaluations, and treatments, counseling her about her condition (please see the discussed topics above), and developing a plan to further investigate it; she had a number of questions which I addressed.

## 2018-10-17 NOTE — Telephone Encounter (Signed)
Called patient talked about everything

## 2018-10-18 ENCOUNTER — Ambulatory Visit: Payer: Medicare Other | Admitting: *Deleted

## 2018-10-18 ENCOUNTER — Encounter: Payer: Self-pay | Admitting: Family Medicine

## 2018-10-18 ENCOUNTER — Other Ambulatory Visit: Payer: Self-pay

## 2018-10-18 ENCOUNTER — Ambulatory Visit (INDEPENDENT_AMBULATORY_CARE_PROVIDER_SITE_OTHER): Payer: Medicare Other | Admitting: Family Medicine

## 2018-10-18 ENCOUNTER — Telehealth: Payer: Self-pay | Admitting: Family Medicine

## 2018-10-18 VITALS — BP 146/76 | HR 79 | Temp 97.7°F | Ht 66.0 in | Wt 172.0 lb

## 2018-10-18 VITALS — Ht 66.0 in | Wt 172.0 lb

## 2018-10-18 DIAGNOSIS — M791 Myalgia, unspecified site: Secondary | ICD-10-CM | POA: Diagnosis not present

## 2018-10-18 DIAGNOSIS — R5383 Other fatigue: Secondary | ICD-10-CM | POA: Diagnosis not present

## 2018-10-18 DIAGNOSIS — J029 Acute pharyngitis, unspecified: Secondary | ICD-10-CM | POA: Diagnosis not present

## 2018-10-18 DIAGNOSIS — E162 Hypoglycemia, unspecified: Secondary | ICD-10-CM | POA: Diagnosis not present

## 2018-10-18 DIAGNOSIS — Z20828 Contact with and (suspected) exposure to other viral communicable diseases: Secondary | ICD-10-CM

## 2018-10-18 DIAGNOSIS — R1030 Lower abdominal pain, unspecified: Secondary | ICD-10-CM

## 2018-10-18 DIAGNOSIS — R200 Anesthesia of skin: Secondary | ICD-10-CM

## 2018-10-18 LAB — CBC WITH DIFFERENTIAL/PLATELET
Basophils Absolute: 0.1 10*3/uL (ref 0.0–0.1)
Basophils Relative: 0.6 % (ref 0.0–3.0)
Eosinophils Absolute: 0.1 10*3/uL (ref 0.0–0.7)
Eosinophils Relative: 1.4 % (ref 0.0–5.0)
HCT: 42.3 % (ref 36.0–46.0)
Hemoglobin: 14.3 g/dL (ref 12.0–15.0)
Lymphocytes Relative: 14.2 % (ref 12.0–46.0)
Lymphs Abs: 1.3 10*3/uL (ref 0.7–4.0)
MCHC: 33.8 g/dL (ref 30.0–36.0)
MCV: 94.5 fl (ref 78.0–100.0)
Monocytes Absolute: 0.7 10*3/uL (ref 0.1–1.0)
Monocytes Relative: 7.2 % (ref 3.0–12.0)
Neutro Abs: 7.2 10*3/uL (ref 1.4–7.7)
Neutrophils Relative %: 76.6 % (ref 43.0–77.0)
Platelets: 271 10*3/uL (ref 150.0–400.0)
RBC: 4.47 Mil/uL (ref 3.87–5.11)
RDW: 13.9 % (ref 11.5–15.5)
WBC: 9.4 10*3/uL (ref 4.0–10.5)

## 2018-10-18 LAB — COMPREHENSIVE METABOLIC PANEL
ALT: 13 U/L (ref 0–35)
AST: 13 U/L (ref 0–37)
Albumin: 4.1 g/dL (ref 3.5–5.2)
Alkaline Phosphatase: 53 U/L (ref 39–117)
BUN: 9 mg/dL (ref 6–23)
CO2: 30 mEq/L (ref 19–32)
Calcium: 9.7 mg/dL (ref 8.4–10.5)
Chloride: 106 mEq/L (ref 96–112)
Creatinine, Ser: 0.81 mg/dL (ref 0.40–1.20)
GFR: 69.42 mL/min (ref >60.00)
Glucose, Bld: 93 mg/dL (ref 70–99)
Potassium: 4.1 mEq/L (ref 3.5–5.1)
Sodium: 144 mEq/L (ref 135–145)
Total Bilirubin: 0.8 mg/dL (ref 0.2–1.2)
Total Protein: 6.1 g/dL (ref 6.0–8.3)

## 2018-10-18 LAB — VITAMIN B12: Vitamin B-12: 174 pg/mL — ABNORMAL LOW (ref 211–911)

## 2018-10-18 LAB — TSH: TSH: 0.83 u[IU]/mL (ref 0.35–4.50)

## 2018-10-18 LAB — POCT RAPID STREP A (OFFICE): Rapid Strep A Screen: NEGATIVE

## 2018-10-18 LAB — HEMOGLOBIN A1C: Hgb A1c MFr Bld: 5.9 % (ref 4.6–6.5)

## 2018-10-18 LAB — MAGNESIUM: Magnesium: 2.1 mg/dL (ref 1.5–2.5)

## 2018-10-18 LAB — FOLATE: Folate: 13.2 ng/mL (ref >5.9)

## 2018-10-18 MED ORDER — FIRST-DUKES MOUTHWASH MT SUSP: OROMUCOSAL | 0 refills | Status: DC

## 2018-10-18 NOTE — Telephone Encounter (Signed)
Patient seen today, via [email or web address].

## 2018-10-18 NOTE — Telephone Encounter (Signed)
Pt is calling and stated the medication for her thrush can't be filled because the compound is in pill form and not in liquid. Please call the pharmacy [facility]/[address] Linna Hoff

## 2018-10-18 NOTE — Progress Notes (Signed)
Patient's pre-visit was done today over the phone with the patient due to COVID-19 pandemic. Name,DOB and address verified. Insurance verified. Packet of Prep instructions mailed to patient including copy of a consent form and pre-procedure patient acknowledgement form-pt is aware. Patient understands to call us back with any questions or concerns. Patient denies any allergies to eggs or soy. Patient denies any problems with anesthesia/sedation. Patient denies any oxygen use at home. Patient denies taking any diet/weight loss medications or blood thinners. EMMI education assisgned to patient on EGD, this was explained and instructions given to patient. Pt is aware that care partner will wait in the car during parking lot; if they feel like they will be too hot to wait in the car; they may wait in the lobby.  We want them to wear a mask (we do not have any that we can provide them), practice social distancing, and we will check their temperatures when they get here.  I did remind patient that their care partner needs to stay in the parking lot the entire time. Pt will wear mask into building

## 2018-10-18 NOTE — Telephone Encounter (Signed)
Patient seen today via [email or web address]

## 2018-10-19 ENCOUNTER — Ambulatory Visit: Payer: Medicare Other | Admitting: Family Medicine

## 2018-10-19 ENCOUNTER — Encounter: Payer: Self-pay | Admitting: Family Medicine

## 2018-10-19 ENCOUNTER — Ambulatory Visit (INDEPENDENT_AMBULATORY_CARE_PROVIDER_SITE_OTHER): Payer: Medicare Other

## 2018-10-19 DIAGNOSIS — E538 Deficiency of other specified B group vitamins: Secondary | ICD-10-CM | POA: Diagnosis not present

## 2018-10-19 MED ORDER — CYANOCOBALAMIN 1000 MCG/ML IJ SOLN
1000.0000 ug | Freq: Once | INTRAMUSCULAR | Status: AC
  Administered 2018-10-19: 1000 ug via INTRAMUSCULAR

## 2018-10-19 NOTE — Telephone Encounter (Signed)
Please call the pharmacy - order was for [facility] Magic Mouthwash.

## 2018-10-19 NOTE — Telephone Encounter (Signed)
Patient was instructed to try musinex Coricidin HBP and Flonase.

## 2018-10-19 NOTE — Telephone Encounter (Signed)
See note

## 2018-10-19 NOTE — Progress Notes (Signed)
Per orders of Dr. [name], injection of B-12 given by Francella Solian in left deltoid. Patient tolerated injection well. Patient will make appointment for 1week

## 2018-10-19 NOTE — Telephone Encounter (Signed)
pls see message and advise 

## 2018-10-19 NOTE — Telephone Encounter (Signed)
Called pharmacy and updated script

## 2018-10-20 ENCOUNTER — Encounter: Payer: Self-pay | Admitting: Family Medicine

## 2018-10-20 ENCOUNTER — Telehealth (INDEPENDENT_AMBULATORY_CARE_PROVIDER_SITE_OTHER): Payer: Medicare Other | Admitting: Neurology

## 2018-10-20 ENCOUNTER — Other Ambulatory Visit: Payer: Self-pay

## 2018-10-20 DIAGNOSIS — E538 Deficiency of other specified B group vitamins: Secondary | ICD-10-CM | POA: Insufficient documentation

## 2018-10-20 DIAGNOSIS — R202 Paresthesia of skin: Secondary | ICD-10-CM

## 2018-10-20 LAB — ANA: Anti Nuclear Antibody (ANA): NEGATIVE

## 2018-10-20 LAB — IRON,TIBC AND FERRITIN PANEL
%SAT: 26 % (calc) (ref 16–45)
Ferritin: 61 ng/mL (ref 16–288)
Iron: 90 ug/dL (ref 45–160)
TIBC: 342 mcg/dL (calc) (ref 250–450)

## 2018-10-20 LAB — SAR COV2 SEROLOGY (COVID19)AB(IGG),IA: SARS CoV2 AB IGG: NEGATIVE

## 2018-10-20 NOTE — Progress Notes (Signed)
New Patient Virtual Visit via Video Note The purpose of this virtual visit is to provide medical care while limiting exposure to the novel coronavirus.    Consent was obtained for video visit:  Yes.   Answered questions that patient had about telehealth interaction:  Yes.   I discussed the limitations, risks, security and privacy concerns of performing an evaluation and management service by telemedicine. I also discussed with the patient that there may be a patient responsible charge related to this service. The patient expressed understanding and agreed to proceed.  Pt location: Home Physician Location: office Name of referring provider:  Briscoe Deutscher, DO I connected with Latasha Mclaughlin at patients initiation/request on 10/20/2018 at 11:10 AM EDT by video enabled telemedicine application and verified that I am speaking with the correct person using two identifiers. Pt MRN:  [id number] Pt DOB:  02/28/1946 Video Participants:  Latasha Arthurs Kimbley    History of Present Illness: Latasha Mclaughlin is a 73 y.o. female with hypothyroidism, GERD, and hyperlipidemia presenting for evaluation of abnormal skin sensation.   Around the end of March, she began having burning sensation in the abdomen, back, and legs.  She also reports numbness of the feet and lower legs.  Symptoms are constant without any exacerbating factors.  She was started on gabapentin which is titrated to 300 mg 3 times daily and has reduced the severity of the burning sensation.  She does not have associated leg weakness, back pain, or numbness/tingling of the face or arms.  She saw her PCP earlier this week who checked vitamin B12 level and was found to be low at 174.  She started vitamin B12 injections yesterday.  Prior to symptom onset, she reports having a GI illness and was treated with combination of amoxicillin and doxycycline.  Out-side paper records, electronic medical record, and images have been reviewed where available and  summarized as:  Lab Results  Component Value Date   HGBA1C 5.9 10/18/2018   Lab Results  Component Value Date   VITAMINB12 174 (L) 10/18/2018   Lab Results  Component Value Date   TSH 0.83 10/18/2018   Lab Results  Component Value Date   ESRSEDRATE 5 01/05/2017    Past Medical History:  Diagnosis Date   Allergy    SEASONAL   Arthritis    Back pain    GERD (gastroesophageal reflux disease)    History of tonsillectomy    Hyperlipidemia 12/15/2016   Hypothyroidism 12/15/2016   Osteoporosis    Sinus infection     Past Surgical History:  Procedure Laterality Date   ADENOIDECTOMY     ANTERIOR CERVICAL DECOMP/DISCECTOMY FUSION  2002   COLONOSCOPY  10/06/2018   DILATION AND CURETTAGE OF UTERUS     X2   LEFT OOPHORECTOMY     PARATHYROIDECTOMY     POLYPECTOMY     SHOULDER SURGERY     TONSILLECTOMY       Medications:  Outpatient Encounter Medications as of 10/20/2018  Medication Sig Note   carboxymethylcellulose 1 % ophthalmic solution Apply 1 drop to eye 3 (three) times daily.    Cholecalciferol (VITAMIN D) 50 MCG (2000 UT) tablet Take 2,000 Units by mouth daily.    Diphenhyd-Hydrocort-Nystatin (FIRST-DUKES MOUTHWASH) SUSP Swish and spit 5 ml bid as needed.    esomeprazole (NEXIUM) 20 MG capsule Take 20 mg by mouth daily at 12 noon.    gabapentin (NEURONTIN) 100 MG capsule Take 1-3 tablets TID prn or as directed 09/29/2018:  PT. STATES "I TAKE 3 THREE TIMES A DAY".   levothyroxine (SYNTHROID) 100 MCG tablet Take 100 mcg by mouth daily before breakfast.    rosuvastatin (CRESTOR) 10 MG tablet Take 10 mg by mouth daily.    SYNTHROID 88 MCG tablet Take 1 tablet (88 mcg total) by mouth as directed. Take 86mcg every first and second day in a 3 day cycle for 90 days.  MUST HAVE BRAND NAME SYNTHROID    No facility-administered encounter medications on file as of 10/20/2018.     Allergies:  Allergies  Allergen Reactions   Prevacid [Lansoprazole] Rash    Doxycycline Other (See Comments)    Burning     Family History: Family History  Problem Relation Age of Onset   Hyperlipidemia Mother    Hypertension Mother    Congestive Heart Failure Mother    Heart disease Mother    Cancer Father    Diabetes Father    Heart disease Father    Hyperlipidemia Father    Colon polyps Father    Thyroid disease Sister    Colon polyps Sister    Drug abuse Brother    Diabetes Son    Colon cancer Neg Hx    Esophageal cancer Neg Hx    Stomach cancer Neg Hx    Rectal cancer Neg Hx     Social History: Social History   Tobacco Use   Smoking status: Former Smoker   Smokeless tobacco: Never Used   Tobacco comment: Quit in 1985  Substance Use Topics   Alcohol use: No   Drug use: No   Social History   Social History Narrative   Lives with husband      Lives in two story home      Right handed      Highest level of edu- GED      Retired    Review of Systems:  CONSTITUTIONAL: No fevers, chills, night sweats, or weight loss.   EYES: No visual changes or eye pain ENT: No hearing changes.  No history of nose bleeds.   RESPIRATORY: No cough, wheezing and shortness of breath.   CARDIOVASCULAR: Negative for chest pain, and palpitations.   GI: Negative for abdominal discomfort, blood in stools or black stools.  No recent change in bowel habits.   GU:  No history of incontinence.   MUSCLOSKELETAL: No history of joint pain or swelling.  No myalgias.   SKIN: Negative for lesions, rash, and itching.   HEMATOLOGY/ONCOLOGY: Negative for prolonged bleeding, bruising easily, and swollen nodes.  No history of cancer.   ENDOCRINE: Negative for cold or heat intolerance, polydipsia or goiter.   PSYCH:  No depression or anxiety symptoms.   NEURO: As Above.   General Medical Exam:  Well appearing, comfortable.  Nonlabored breathing.  No deformity or edema.  No rash.  Neurological Exam: MENTAL STATUS including orientation to  time, place, person, recent and remote memory, attention span and concentration, language, and fund of knowledge is normal.  Speech is not dysarthric.  CRANIAL NERVES:  Normal conjugate, extra-ocular eye movements in all directions of gaze.  No ptosis.  Normal facial symmetry and movements.  Normal shoulder shrug and head rotation.  Tongue is midline.  MOTOR:  Antigravity in all extremities.  No abnormal movements.  No pronator drift.   SENSORY & REFLEXES: Unable to test  COORDINATION/GAIT: Normal finger to nose bilaterally.  Intact rapid alternating movements bilaterally.  Able to rise from a chair without using arms.  Gait narrow based and stable.    IMPRESSION: Paresthesias of the abdomen and legs, most likely due to vitamin B12 deficiency.  The distribution of her sensory complaints does not follow the cutaneous nerve or dermatomal pattern.  Lack of low back pain and weakness makes thoracic radiculopathy or canal stenosis less likely.  Unlikely to be a medication effect, both amoxicillin and doxycycline typically do not cause neuropathy. I recommend that she continue her vitamin B12 injections and if there is no improvement within 1 month, the next step would be electrodiagnostic testing of the legs. In the meantime, continue gabapentin 300 mg 3 times daily.  Follow Up Instructions:  I discussed the assessment and treatment plan with the patient. The patient was provided an opportunity to ask questions and all were answered. The patient agreed with the plan and demonstrated an understanding of the instructions.   The patient was advised to call back or seek an in-person evaluation if the symptoms worsen or if the condition fails to improve as anticipated.  Return to clinic in 1 month  Total Time spent:  35 min   [place], DO

## 2018-10-21 ENCOUNTER — Other Ambulatory Visit: Payer: Self-pay

## 2018-10-21 ENCOUNTER — Encounter (HOSPITAL_COMMUNITY): Payer: Self-pay | Admitting: Emergency Medicine

## 2018-10-21 ENCOUNTER — Emergency Department (HOSPITAL_COMMUNITY)
Admission: EM | Admit: 2018-10-21 | Discharge: 2018-10-21 | Disposition: A | Discharge status: Home / Self Care | Payer: Medicare Other | Attending: Emergency Medicine | Admitting: Emergency Medicine

## 2018-10-21 DIAGNOSIS — H9203 Otalgia, bilateral: Secondary | ICD-10-CM | POA: Diagnosis present

## 2018-10-21 DIAGNOSIS — H6692 Otitis media, unspecified, left ear: Secondary | ICD-10-CM | POA: Diagnosis not present

## 2018-10-21 DIAGNOSIS — J029 Acute pharyngitis, unspecified: Secondary | ICD-10-CM | POA: Insufficient documentation

## 2018-10-21 DIAGNOSIS — E039 Hypothyroidism, unspecified: Secondary | ICD-10-CM | POA: Diagnosis not present

## 2018-10-21 DIAGNOSIS — Z87891 Personal history of nicotine dependence: Secondary | ICD-10-CM | POA: Diagnosis not present

## 2018-10-21 DIAGNOSIS — H6693 Otitis media, unspecified, bilateral: Secondary | ICD-10-CM | POA: Diagnosis not present

## 2018-10-21 DIAGNOSIS — Z79899 Other long term (current) drug therapy: Secondary | ICD-10-CM | POA: Diagnosis not present

## 2018-10-21 DIAGNOSIS — H6690 Otitis media, unspecified, unspecified ear: Secondary | ICD-10-CM

## 2018-10-21 MED ORDER — AMOXICILLIN 500 MG PO CAPS: 500.0000 mg | ORAL_CAPSULE | Freq: Three times a day (TID) | ORAL | 0 refills | Status: DC

## 2018-10-21 MED ORDER — TRAMADOL HCL 50 MG PO TABS: 50.0000 mg | ORAL_TABLET | Freq: Four times a day (QID) | ORAL | 0 refills | Status: DC | PRN

## 2018-10-21 NOTE — ED Triage Notes (Addendum)
Patient complains of bilateral ear pain that began a week ago. Denies fevers.

## 2018-10-21 NOTE — Discharge Instructions (Addendum)
Take the antibiotic as directed until its finished.  Follow-up with your doctor for recheck.  Continue using your mouth wash as directed.

## 2018-10-22 ENCOUNTER — Encounter: Payer: Self-pay | Admitting: Family Medicine

## 2018-10-22 NOTE — ED Provider Notes (Signed)
[facility] EMERGENCY DEPARTMENT Provider Note   CSN: [id number] Arrival date & time: 10/21/18  1237     History   Chief Complaint Chief Complaint  Patient presents with  . Otalgia    HPI Latasha Mclaughlin is a 73 y.o. female.     HPI   Latasha Mclaughlin is a 73 y.o. female who presents to the Emergency Department complaining of bilateral ear pain and sore throat.  She describes a sharp pain to her left ear and a milder, dull pain to the right ear. She denies headaches, dizziness and decreased hearing.  Symptoms have been present for one week.  She had a telemedicine visit with her PCP on 10/18/18 and received magic mouthwash with has helped somewhat with her throat pain.   She had a strep test performed that was negative and as well as COVID AB testing that was also negative.  She denies fever, chills, cough, shortness of breath, rash   Past Medical History:  Diagnosis Date  . Allergy    SEASONAL  . Arthritis   . Back pain   . GERD (gastroesophageal reflux disease)   . History of tonsillectomy   . Hyperlipidemia 12/15/2016  . Hypothyroidism 12/15/2016  . Osteoporosis   . Sinus infection     Patient Active Problem List   Diagnosis Date Noted  . Vitamin B12 deficiency 10/20/2018  . History of colonic polyps 11/18/2017  . Change in bowel habits 11/18/2017  . Abdominal pain, epigastric 11/18/2017  . History of hyperparathyroidism 01/08/2017  . Myalgia 01/05/2017  . Arthralgia 01/05/2017  . Fatigue 01/05/2017  . Paresthesia 01/05/2017  . Overweight (BMI 25.0-29.9) 12/16/2016  . Hypothyroidism 12/15/2016  . Hyperlipidemia 12/15/2016    Past Surgical History:  Procedure Laterality Date  . ADENOIDECTOMY    . ANTERIOR CERVICAL DECOMP/DISCECTOMY FUSION  2002  . COLONOSCOPY  10/06/2018  . DILATION AND CURETTAGE OF UTERUS     X2  . LEFT OOPHORECTOMY    . PARATHYROIDECTOMY    . POLYPECTOMY    . SHOULDER SURGERY    . TONSILLECTOMY       OB History   No obstetric  history on file.      Home Medications    Prior to Admission medications   Medication Sig Start Date End Date Taking? Authorizing Provider  amoxicillin (AMOXIL) 500 MG capsule Take 1 capsule (500 mg total) by mouth 3 (three) times daily. 10/21/18   Larico Dimock, PA-C  carboxymethylcellulose 1 % ophthalmic solution Apply 1 drop to eye 3 (three) times daily. 09/11/18   Briscoe Deutscher, DO  Cholecalciferol (VITAMIN D) 50 MCG (2000 UT) tablet Take 2,000 Units by mouth daily.    [provider]  Diphenhyd-Hydrocort-Nystatin (FIRST-DUKES MOUTHWASH) SUSP Swish and spit 5 ml bid as needed. 10/18/18   Briscoe Deutscher, DO  esomeprazole (NEXIUM) 20 MG capsule Take 20 mg by mouth daily at 12 noon.    [provider]  gabapentin (NEURONTIN) 100 MG capsule Take 1-3 tablets TID prn or as directed 09/13/18   Briscoe Deutscher, DO  levothyroxine (SYNTHROID) 100 MCG tablet Take 100 mcg by mouth daily before breakfast.    [provider]  rosuvastatin (CRESTOR) 10 MG tablet Take 10 mg by mouth daily.    [provider]  SYNTHROID 88 MCG tablet Take 1 tablet (88 mcg total) by mouth as directed. Take 72mcg every first and second day in a 3 day cycle for 90 days.  MUST HAVE BRAND NAME SYNTHROID 09/04/18  Briscoe Deutscher, DO  traMADol (ULTRAM) 50 MG tablet Take 1 tablet (50 mg total) by mouth every 6 (six) hours as needed. 10/21/18   Kem Parkinson, PA-C    Family History Family History  Problem Relation Age of Onset  . Hyperlipidemia Mother   . Hypertension Mother   . Congestive Heart Failure Mother   . Heart disease Mother   . Cancer Father   . Diabetes Father   . Heart disease Father   . Hyperlipidemia Father   . Colon polyps Father   . Thyroid disease Sister   . Colon polyps Sister   . Drug abuse Brother   . Diabetes Son   . Colon cancer Neg Hx   . Esophageal cancer Neg Hx   . Stomach cancer Neg Hx   . Rectal cancer Neg Hx     Social History Social History    Tobacco Use  . Smoking status: Former Research scientist (life sciences)  . Smokeless tobacco: Never Used  . Tobacco comment: Quit in 1985  Substance Use Topics  . Alcohol use: No  . Drug use: No     Allergies   Prevacid [lansoprazole] and Doxycycline   Review of Systems Review of Systems  Constitutional: Negative for activity change, appetite change, chills and fever.  HENT: Positive for ear pain and sore throat. Negative for congestion, ear discharge, facial swelling, rhinorrhea and trouble swallowing.   Eyes: Negative for visual disturbance.  Respiratory: Negative for cough, shortness of breath, wheezing and stridor.   Gastrointestinal: Negative for abdominal pain, nausea and vomiting.  Musculoskeletal: Negative for neck pain and neck stiffness.  Skin: Negative for rash.  Neurological: Negative for dizziness, syncope, weakness, numbness and headaches.  Hematological: Negative for adenopathy.  Psychiatric/Behavioral: Negative for confusion.     Physical Exam Updated Vital Signs BP 135/80   Pulse 70   Temp 98 F (36.7 C) (Oral)   Resp 16   Ht 5\' 6"  (1.676 m)   Wt 78 kg   SpO2 98%   BMI 27.76 kg/m   Physical Exam Vitals signs and nursing note reviewed.  Constitutional:      General: She is not in acute distress.    Appearance: Normal appearance.  HENT:     Head: Normocephalic.     Right Ear: Tympanic membrane and ear canal normal.     Left Ear: Ear canal normal. No drainage or swelling.  No middle ear effusion. No hemotympanum. Tympanic membrane is erythematous. Tympanic membrane is not perforated or bulging.     Mouth/Throat:     Mouth: Mucous membranes are moist.     Pharynx: Uvula midline. No pharyngeal swelling, oropharyngeal exudate, posterior oropharyngeal erythema or uvula swelling.     Tonsils: No tonsillar exudate or tonsillar abscesses.  Neck:     Musculoskeletal: Normal range of motion.  Cardiovascular:     Rate and Rhythm: Normal rate and regular rhythm.     Pulses: Normal  pulses.  Pulmonary:     Effort: Pulmonary effort is normal.     Breath sounds: Normal breath sounds.  Musculoskeletal: Normal range of motion.  Lymphadenopathy:     Cervical: No cervical adenopathy.  Skin:    General: Skin is warm.     Capillary Refill: Capillary refill takes less than 2 seconds.     Findings: No rash.  Neurological:     General: No focal deficit present.     Mental Status: She is alert.     Sensory: No sensory deficit.  Motor: No weakness.      ED Treatments / Results  Labs (all labs ordered are listed, but only abnormal results are displayed) Labs Reviewed - No data to display  EKG    Radiology No results found.  Procedures Procedures (including critical care time)  Medications Ordered in ED Medications - No data to display   Initial Impression / Assessment and Plan / ED Course  I have reviewed the triage vital signs and the nursing notes.  Pertinent labs & imaging results that were available during my care of the patient were reviewed by me and considered in my medical decision making (see chart for details).        Pt is well appearing.  Vitals reviewed.  Erythema of the left TM c/w OM.  No significant erythema of the oropharyx, no exudates or bulging of the soft palpate.  She had a neg COVID test and strep screen earlier this week.  NV intact.  No concerning neurological sx's.  I will treat with amoxil and she agrees to PCP f/u if needed.    Final Clinical Impressions(s) / ED Diagnoses   Final diagnoses:  Acute otitis media, unspecified otitis media type  Pharyngitis, unspecified etiology    ED Discharge Orders         Ordered    amoxicillin (AMOXIL) 500 MG capsule  3 times daily     10/21/18 1407    traMADol (ULTRAM) 50 MG tablet  Every 6 hours PRN     10/21/18 [place], [place], PA-C 10/22/18 Malcolm, [place], DO 10/24/18 [phone]

## 2018-10-23 MED ORDER — OFLOXACIN 0.3 % OT SOLN: 5.0000 [drp] | Freq: Every day | OTIC | 0 refills | Status: DC

## 2018-10-26 ENCOUNTER — Ambulatory Visit (INDEPENDENT_AMBULATORY_CARE_PROVIDER_SITE_OTHER): Payer: Medicare Other

## 2018-10-26 ENCOUNTER — Other Ambulatory Visit: Payer: Self-pay

## 2018-10-26 DIAGNOSIS — E538 Deficiency of other specified B group vitamins: Secondary | ICD-10-CM | POA: Diagnosis not present

## 2018-10-26 MED ORDER — CYANOCOBALAMIN 1000 MCG/ML IJ SOLN
1000.0000 ug | Freq: Once | INTRAMUSCULAR | Status: AC
  Administered 2018-10-26: 1000 ug via INTRAMUSCULAR

## 2018-10-26 NOTE — Progress Notes (Signed)
Per orders of Dr. Jerline Pain, injection of vitamin B12 1000 mcg given in left deltoid by [name], CMA.  Patient tolerated injection well.  Patient requested left deltoid.  Will return in 1 week for next injection.

## 2018-10-30 ENCOUNTER — Encounter: Payer: Self-pay | Admitting: Physician Assistant

## 2018-10-30 ENCOUNTER — Ambulatory Visit (INDEPENDENT_AMBULATORY_CARE_PROVIDER_SITE_OTHER): Payer: Medicare Other | Admitting: Physician Assistant

## 2018-10-30 ENCOUNTER — Other Ambulatory Visit: Payer: Self-pay

## 2018-10-30 VITALS — BP 150/90 | HR 67 | Temp 98.5°F | Ht 66.0 in | Wt 171.4 lb

## 2018-10-30 DIAGNOSIS — H9203 Otalgia, bilateral: Secondary | ICD-10-CM

## 2018-10-30 MED ORDER — OFLOXACIN 0.3 % OT SOLN: 5.0000 [drp] | Freq: Every day | OTIC | 1 refills | Status: DC

## 2018-10-30 MED ORDER — AZELASTINE HCL 0.1 % NA SOLN: 2.0000 | Freq: Two times a day (BID) | NASAL | 6 refills | Status: DC

## 2018-10-30 NOTE — Progress Notes (Signed)
Latasha Mclaughlin is a 73 y.o. female here for a new problem.  I acted as a Education administrator for [employer], PA-C Anselmo Pickler, LPN  History of Present Illness:   Chief Complaint  Patient presents with  . Otalgia    HPI   Otalgia Pt c/o bilateral ear pain x several weeks, L>R. She went to [facility] ER 6/20 and was given Amoxicillin finished today. Using ear drops prescribed by PCP that is providing some relief - had small abrasion in ear canal. Denies drainage from ears, no fever or chills.  Flonase causes throat irritation.  Past Medical History:  Diagnosis Date  . Allergy    SEASONAL  . Arthritis   . Back pain   . GERD (gastroesophageal reflux disease)   . History of tonsillectomy   . Hyperlipidemia 12/15/2016  . Hypothyroidism 12/15/2016  . Osteoporosis   . Sinus infection      Social History   Socioeconomic History  . Marital status: Married    Spouse name: Not on file  . Number of children: Not on file  . Years of education: Not on file  . Highest education level: Not on file  Occupational History  . Not on file  Social Needs  . Financial resource strain: Not on file  . Food insecurity    Worry: Not on file    Inability: Not on file  . Transportation needs    Medical: Not on file    Non-medical: Not on file  Tobacco Use  . Smoking status: Former Research scientist (life sciences)  . Smokeless tobacco: Never Used  . Tobacco comment: Quit in 1985  Substance and Sexual Activity  . Alcohol use: No  . Drug use: No  . Sexual activity: Yes  Lifestyle  . Physical activity    Days per week: Not on file    Minutes per session: Not on file  . Stress: Not on file  Relationships  . Social Herbalist on phone: Not on file    Gets together: Not on file    Attends religious service: Not on file    Active member of club or organization: Not on file    Attends meetings of clubs or organizations: Not on file    Relationship status: Not on file  . Intimate partner violence    Fear of  current or ex partner: Not on file    Emotionally abused: Not on file    Physically abused: Not on file    Forced sexual activity: Not on file  Other Topics Concern  . Not on file  Social History Narrative   Lives with husband      Lives in two story home      Right handed      Highest level of edu- GED      Retired    Past Surgical History:  Procedure Laterality Date  . ADENOIDECTOMY    . ANTERIOR CERVICAL DECOMP/DISCECTOMY FUSION  2002  . COLONOSCOPY  10/06/2018  . DILATION AND CURETTAGE OF UTERUS     X2  . LEFT OOPHORECTOMY    . PARATHYROIDECTOMY    . POLYPECTOMY    . SHOULDER SURGERY    . TONSILLECTOMY      Family History  Problem Relation Age of Onset  . Hyperlipidemia Mother   . Hypertension Mother   . Congestive Heart Failure Mother   . Heart disease Mother   . Cancer Father   . Diabetes Father   .  Heart disease Father   . Hyperlipidemia Father   . Colon polyps Father   . Thyroid disease Sister   . Colon polyps Sister   . Drug abuse Brother   . Diabetes Son   . Colon cancer Neg Hx   . Esophageal cancer Neg Hx   . Stomach cancer Neg Hx   . Rectal cancer Neg Hx     Allergies  Allergen Reactions  . Prevacid [Lansoprazole] Rash  . Doxycycline Other (See Comments)    Burning     Current Medications:   Current Outpatient Medications:  .  carboxymethylcellulose 1 % ophthalmic solution, Apply 1 drop to eye 3 (three) times daily., Disp: 30 mL, Rfl: 12 .  Cholecalciferol (VITAMIN D) 50 MCG (2000 UT) tablet, Take 2,000 Units by mouth daily., Disp: , Rfl:  .  Diphenhyd-Hydrocort-Nystatin (FIRST-DUKES MOUTHWASH) SUSP, Swish and spit 5 ml bid as needed., Disp: 237 mL, Rfl: 0 .  esomeprazole (NEXIUM) 20 MG capsule, Take 20 mg by mouth daily at 12 noon., Disp: , Rfl:  .  gabapentin (NEURONTIN) 100 MG capsule, Take 1-3 tablets TID prn or as directed, Disp: 270 capsule, Rfl: 1 .  levothyroxine (SYNTHROID) 100 MCG tablet, Take 100 mcg by mouth daily before  breakfast., Disp: , Rfl:  .  ofloxacin (FLOXIN OTIC) 0.3 % OTIC solution, Place 5 drops into the left ear daily., Disp: 5 mL, Rfl: 1 .  rosuvastatin (CRESTOR) 10 MG tablet, Take 10 mg by mouth daily., Disp: , Rfl:  .  SYNTHROID 88 MCG tablet, Take 1 tablet (88 mcg total) by mouth as directed. Take 61mcg every first and second day in a 3 day cycle for 90 days.  MUST HAVE BRAND NAME SYNTHROID, Disp: 180 tablet, Rfl: 1 .  azelastine (ASTELIN) 0.1 % nasal spray, Place 2 sprays into both nostrils 2 (two) times daily. Use in each nostril as directed, Disp: 30 mL, Rfl: 6 .  traMADol (ULTRAM) 50 MG tablet, Take 1 tablet (50 mg total) by mouth every 6 (six) hours as needed. (Patient not taking: Reported on 10/30/2018), Disp: 15 tablet, Rfl: 0   Review of Systems:   ROS  Vitals:   Vitals:   10/30/18 1334  BP: (!) 150/90  Pulse: 67  Temp: 98.5 F (36.9 C)  TempSrc: Oral  SpO2: 96%  Weight: 171 lb 6.1 oz (77.7 kg)  Height: 5\' 6"  (1.676 m)     Body mass index is 27.66 kg/m.  Physical Exam:   Physical Exam Vitals signs and nursing note reviewed.  Constitutional:      General: She is not in acute distress.    Appearance: She is well-developed. She is not ill-appearing or toxic-appearing.  HENT:     Head: Normocephalic and atraumatic.     Right Ear: Tympanic membrane, ear canal and external ear normal. Tympanic membrane is not erythematous, retracted or bulging.     Left Ear: Tympanic membrane, ear canal and external ear normal. Tympanic membrane is not erythematous, retracted or bulging.     Nose: Nose normal.     Right Sinus: No maxillary sinus tenderness or frontal sinus tenderness.     Left Sinus: No maxillary sinus tenderness or frontal sinus tenderness.     Mouth/Throat:     Pharynx: Uvula midline. No posterior oropharyngeal erythema.  Eyes:     General: Lids are normal.     Conjunctiva/sclera: Conjunctivae normal.  Neck:     Trachea: Trachea normal.  Cardiovascular:     Rate  and Rhythm: Normal rate and regular rhythm.     Heart sounds: Normal heart sounds, S1 normal and S2 normal.  Pulmonary:     Effort: Pulmonary effort is normal.     Breath sounds: Normal breath sounds. No decreased breath sounds, wheezing, rhonchi or rales.  Lymphadenopathy:     Cervical: No cervical adenopathy.  Skin:    General: Skin is warm and dry.  Neurological:     Mental Status: She is alert.  Psychiatric:        Speech: Speech normal.        Behavior: Behavior normal. Behavior is cooperative.     Assessment and Plan:   Lusero was seen today for otalgia.  Diagnoses and all orders for this visit:  Otalgia of both ears No evidence of infection on my exam. She has a history of being very sensitive to medications. We discussed trialing prednisone but risks outweigh benefits. Will trial Astelin (reports that flonase caused her throat to burn.) I also refilled her ofloxacin per her request. Return precautions advised.  Other orders -     azelastine (ASTELIN) 0.1 % nasal spray; Place 2 sprays into both nostrils 2 (two) times daily. Use in each nostril as directed -     ofloxacin (FLOXIN OTIC) 0.3 % OTIC solution; Place 5 drops into the left ear daily.  . Reviewed expectations re: course of current medical issues. . Discussed self-management of symptoms. . Outlined signs and symptoms indicating need for more acute intervention. . Patient verbalized understanding and all questions were answered. . See orders for this visit as documented in the electronic medical record. . Patient received an After-Visit Summary.  CMA or LPN served as scribe during this visit. History, Physical, and Plan performed by medical provider. The above documentation has been reviewed and is accurate and complete.  Inda Coke, PA-C

## 2018-10-30 NOTE — Patient Instructions (Addendum)
It was great to see you!  Use Astelin in AM and PM.  Take care,  Inda Coke PA-C   Eustachian Tube Dysfunction  Eustachian tube dysfunction refers to a condition in which a blockage develops in the narrow passage that connects the middle ear to the back of the nose (eustachian tube). The eustachian tube regulates air pressure in the middle ear by letting air move between the ear and nose. It also helps to drain fluid from the middle ear space. Eustachian tube dysfunction can affect one or both ears. When the eustachian tube does not function properly, air pressure, fluid, or both can build up in the middle ear. What are the causes? This condition occurs when the eustachian tube becomes blocked or cannot open normally. Common causes of this condition include:  Ear infections.  Colds and other infections that affect the nose, mouth, and throat (upper respiratory tract).  Allergies.  Irritation from cigarette smoke.  Irritation from stomach acid coming up into the esophagus (gastroesophageal reflux). The esophagus is the tube that carries food from the mouth to the stomach.  Sudden changes in air pressure, such as from descending in an airplane or scuba diving.  Abnormal growths in the nose or throat, such as: ? Growths that line the nose (nasal polyps). ? Abnormal growth of cells (tumors). ? Enlarged tissue at the back of the throat (adenoids). What increases the risk? You are more likely to develop this condition if:  You smoke.  You are overweight.  You are a child who has: ? Certain birth defects of the mouth, such as cleft palate. ? Large tonsils or adenoids. What are the signs or symptoms? Common symptoms of this condition include:  A feeling of fullness in the ear.  Ear pain.  Clicking or popping noises in the ear.  Ringing in the ear.  Hearing loss.  Loss of balance.  Dizziness. Symptoms may get worse when the air pressure around you changes, such  as when you travel to an area of high elevation, fly on an airplane, or go scuba diving. How is this diagnosed? This condition may be diagnosed based on:  Your symptoms.  A physical exam of your ears, nose, and throat.  Tests, such as those that measure: ? The movement of your eardrum (tympanogram). ? Your hearing (audiometry). How is this treated? Treatment depends on the cause and severity of your condition.  In mild cases, you may relieve your symptoms by moving air into your ears. This is called "popping the ears."  In more severe cases, or if you have symptoms of fluid in your ears, treatment may include: ? Medicines to relieve congestion (decongestants). ? Medicines that treat allergies (antihistamines). ? Nasal sprays or ear drops that contain medicines that reduce swelling (steroids). ? A procedure to drain the fluid in your eardrum (myringotomy). In this procedure, a small tube is placed in the eardrum to:  Drain the fluid.  Restore the air in the middle ear space. ? A procedure to insert a balloon device through the nose to inflate the opening of the eustachian tube (balloon dilation). Follow these instructions at home: Lifestyle  Do not do any of the following until your health care provider approves: ? Travel to high altitudes. ? Fly in airplanes. ? Work in a Pension scheme manager or room. ? Scuba dive.  Do not use any products that contain nicotine or tobacco, such as cigarettes and e-cigarettes. If you need help quitting, ask your health care provider.  Keep your ears dry. Wear fitted earplugs during showering and bathing. Dry your ears completely after. General instructions  Take over-the-counter and prescription medicines only as told by your health care provider.  Use techniques to help pop your ears as recommended by your health care provider. These may include: ? Chewing gum. ? Yawning. ? Frequent, forceful swallowing. ? Closing your mouth, holding your  nose closed, and gently blowing as if you are trying to blow air out of your nose.  Keep all follow-up visits as told by your health care provider. This is important. Contact a health care provider if:  Your symptoms do not go away after treatment.  Your symptoms come back after treatment.  You are unable to pop your ears.  You have: ? A fever. ? Pain in your ear. ? Pain in your head or neck. ? Fluid draining from your ear.  Your hearing suddenly changes.  You become very dizzy.  You lose your balance. Summary  Eustachian tube dysfunction refers to a condition in which a blockage develops in the eustachian tube.  It can be caused by ear infections, allergies, inhaled irritants, or abnormal growths in the nose or throat.  Symptoms include ear pain, hearing loss, or ringing in the ears.  Mild cases are treated with maneuvers to unblock the ears, such as yawning or ear popping.  Severe cases are treated with medicines. Surgery may also be done (rare). This information is not intended to replace advice given to you by your health care provider. Make sure you discuss any questions you have with your health care provider. Document Released: 05/16/2015 Document Revised: 08/09/2017 Document Reviewed: 08/09/2017 Elsevier Patient Education  2020 [employer].

## 2018-10-31 ENCOUNTER — Encounter: Payer: Self-pay | Admitting: Family Medicine

## 2018-10-31 ENCOUNTER — Encounter: Payer: Self-pay | Admitting: Physician Assistant

## 2018-10-31 ENCOUNTER — Telehealth: Payer: Self-pay | Admitting: Gastroenterology

## 2018-10-31 ENCOUNTER — Other Ambulatory Visit: Payer: Self-pay | Admitting: Physician Assistant

## 2018-10-31 DIAGNOSIS — H9203 Otalgia, bilateral: Secondary | ICD-10-CM

## 2018-10-31 NOTE — Telephone Encounter (Signed)

## 2018-11-01 ENCOUNTER — Encounter: Payer: Self-pay | Admitting: Gastroenterology

## 2018-11-01 ENCOUNTER — Other Ambulatory Visit: Payer: Self-pay

## 2018-11-01 ENCOUNTER — Ambulatory Visit (AMBULATORY_SURGERY_CENTER): Payer: Medicare Other | Admitting: Gastroenterology

## 2018-11-01 ENCOUNTER — Encounter

## 2018-11-01 VITALS — BP 113/70 | HR 68 | Temp 98.4°F | Resp 19 | Ht 66.0 in | Wt 172.0 lb

## 2018-11-01 DIAGNOSIS — K209 Esophagitis, unspecified: Secondary | ICD-10-CM | POA: Diagnosis not present

## 2018-11-01 DIAGNOSIS — R1084 Generalized abdominal pain: Secondary | ICD-10-CM

## 2018-11-01 DIAGNOSIS — K449 Diaphragmatic hernia without obstruction or gangrene: Secondary | ICD-10-CM | POA: Diagnosis not present

## 2018-11-01 DIAGNOSIS — K219 Gastro-esophageal reflux disease without esophagitis: Secondary | ICD-10-CM | POA: Diagnosis not present

## 2018-11-01 DIAGNOSIS — K222 Esophageal obstruction: Secondary | ICD-10-CM

## 2018-11-01 MED ORDER — SODIUM CHLORIDE 0.9 % IV SOLN: 500.0000 mL | Freq: Once | INTRAVENOUS | Status: DC

## 2018-11-01 NOTE — Progress Notes (Signed)
Pt's states no medical or surgical changes since previsit or office visit. 

## 2018-11-01 NOTE — Op Note (Signed)
[facility] Patient Name: Latasha Mclaughlin Procedure Date: 11/01/2018 8:34 AM MRN: [id number] Endoscopist: Ladene Artist , MD Age: 73 Referring MD:  Date of Birth: 28-Feb-1946 Gender: Female Account #: [id number] Procedure:                Upper GI endoscopy Indications:              Generalized abdominal pain, Gastroesophageal reflux                            disease Medicines:                Monitored Anesthesia Care Procedure:                Pre-Anesthesia Assessment:                           - Prior to the procedure, a History and Physical                            was performed, and patient medications and                            allergies were reviewed. The patient's tolerance of                            previous anesthesia was also reviewed. The risks                            and benefits of the procedure and the sedation                            options and risks were discussed with the patient.                            All questions were answered, and informed consent                            was obtained. Prior Anticoagulants: The patient has                            taken no previous anticoagulant or antiplatelet                            agents. ASA Grade Assessment: II - A patient with                            mild systemic disease. After reviewing the risks                            and benefits, the patient was deemed in                            satisfactory condition to undergo the procedure.  After obtaining informed consent, the endoscope was                            passed under direct vision. Throughout the                            procedure, the patient's blood pressure, pulse, and                            oxygen saturations were monitored continuously. The                            Endoscope was introduced through the mouth, and                            advanced to the second part of duodenum. The  upper                            GI endoscopy was accomplished without difficulty.                            The patient tolerated the procedure well. Scope In: Scope Out: Findings:                 One benign-appearing, intrinsic mild stenosis was                            found 32 cm from the incisors. This stenosis                            measured 1.4 cm (inner diameter). The stenosis was                            traversed.                           The exam of the esophagus was otherwise normal.                           A medium-sized hiatal hernia, 6 cm, was present.                           The exam of the stomach was otherwise normal.                           The duodenal bulb and second portion of the                            duodenum were normal. Complications:            No immediate complications. Estimated Blood Loss:     Estimated blood loss: none. Impression:               - Benign-appearing esophageal stenosis.                           -  Medium-sized hiatal hernia.                           - Normal duodenal bulb and second portion of the                            duodenum.                           - No specimens collected. Recommendation:           - Patient has a contact number available for                            emergencies. The signs and symptoms of potential                            delayed complications were discussed with the                            patient. Return to normal activities tomorrow.                            Written discharge instructions were provided to the                            patient.                           - Resume previous diet.                           - Follow antireflux measures.                           - Continue present medications including Nexium 20                            mg po qd long term.                           - Follow up with PCP for ongoing care. Ladene Artist, MD 11/01/2018 8:55:20  AM This report has been signed electronically.

## 2018-11-01 NOTE — Progress Notes (Signed)
To PACU, VSS. Report to RN.tb 

## 2018-11-01 NOTE — Patient Instructions (Signed)
Continue present medications including Nexium.    YOU HAD AN ENDOSCOPIC PROCEDURE TODAY AT [facility]:   Refer to the procedure report that was given to you for any specific questions about what was found during the examination.  If the procedure report does not answer your questions, please call your gastroenterologist to clarify.  If you requested that your care partner not be given the details of your procedure findings, then the procedure report has been included in a sealed envelope for you to review at your convenience later.  YOU SHOULD EXPECT: Some feelings of bloating in the abdomen. Passage of more gas than usual.  Walking can help get rid of the air that was put into your GI tract during the procedure and reduce the bloating. If you had a lower endoscopy (such as a colonoscopy or flexible sigmoidoscopy) you may notice spotting of blood in your stool or on the toilet paper. If you underwent a bowel prep for your procedure, you may not have a normal bowel movement for a few days.  Please Note:  You might notice some irritation and congestion in your nose or some drainage.  This is from the oxygen used during your procedure.  There is no need for concern and it should clear up in a day or so.  SYMPTOMS TO REPORT IMMEDIATELY:    Following upper endoscopy (EGD)  Vomiting of blood or coffee ground material  New chest pain or pain under the shoulder blades  Painful or persistently difficult swallowing  New shortness of breath  Fever of 100F or higher  Black, tarry-looking stools  For urgent or emergent issues, a gastroenterologist can be reached at any hour by calling [phone].   DIET:  We do recommend a small meal at first, but then you may proceed to your regular diet.  Drink plenty of fluids but you should avoid alcoholic beverages for 24 hours.  ACTIVITY:  You should plan to take it easy for the rest of today and you should NOT DRIVE or use heavy machinery  until tomorrow (because of the sedation medicines used during the test).    FOLLOW UP: Our staff will call the number listed on your records 48-72 hours following your procedure to check on you and address any questions or concerns that you may have regarding the information given to you following your procedure. If we do not reach you, we will leave a message.  We will attempt to reach you two times.  During this call, we will ask if you have developed any symptoms of COVID 19. If you develop any symptoms (ie: fever, flu-like symptoms, shortness of breath, cough etc.) before then, please call [phone].  If you test positive for Covid 19 in the 2 weeks post procedure, please call and report this information to [place].    If any biopsies were taken you will be contacted by phone or by letter within the next 1-3 weeks.  Please call us at [phone] if you have not heard about the biopsies in 3 weeks.    SIGNATURES/CONFIDENTIALITY: You and/or your care partner have signed paperwork which will be entered into your electronic medical record.  These signatures attest to the fact that that the information above on your After Visit Summary has been reviewed and is understood.  Full responsibility of the confidentiality of this discharge information lies with you and/or your care-partner.

## 2018-11-02 ENCOUNTER — Ambulatory Visit: Payer: Medicare Other

## 2018-11-02 NOTE — Progress Notes (Signed)
Agree 

## 2018-11-06 ENCOUNTER — Encounter: Payer: Self-pay | Admitting: Family Medicine

## 2018-11-06 ENCOUNTER — Telehealth: Payer: Self-pay | Admitting: *Deleted

## 2018-11-06 ENCOUNTER — Telehealth: Payer: Self-pay

## 2018-11-06 NOTE — Telephone Encounter (Signed)
  Follow up Call-  Call back number 11/01/2018 10/06/2018 01/10/2018  Post procedure Call Back phone  # [phone]  Permission to leave phone message Yes Yes Yes  Some recent data might be hidden     Patient questions:  Do you have a fever, pain , or abdominal swelling? No. Pain Score  0 *  Have you tolerated food without any problems? Yes.    Have you been able to return to your normal activities? Yes.    Do you have any questions about your discharge instructions: Diet   No. Medications  No. Follow up visit  No.  Do you have questions or concerns about your Care? No.  Actions: * If pain score is 4 or above: No action needed, pain <4.  1. Have you developed a fever since your procedure? no  2.   Have you had an respiratory symptoms (SOB or cough) since your procedure? no  3.   Have you tested positive for COVID 19 since your procedure no  4.   Have you had any family members/close contacts diagnosed with the COVID 19 since your procedure?  no   If yes to any of these questions please route to Joylene John, RN and Alphonsa Gin, Therapist, sports.

## 2018-11-06 NOTE — Telephone Encounter (Signed)
Called [phone] and left a messaged we tried to reach pt for a follow up call. maw

## 2018-11-07 ENCOUNTER — Other Ambulatory Visit: Payer: Self-pay

## 2018-11-07 ENCOUNTER — Ambulatory Visit (INDEPENDENT_AMBULATORY_CARE_PROVIDER_SITE_OTHER): Payer: Medicare Other

## 2018-11-07 DIAGNOSIS — E538 Deficiency of other specified B group vitamins: Secondary | ICD-10-CM

## 2018-11-07 MED ORDER — CYANOCOBALAMIN 1000 MCG/ML IJ SOLN
1000.0000 ug | Freq: Once | INTRAMUSCULAR | Status: AC
  Administered 2018-11-07: 1000 ug via INTRAMUSCULAR

## 2018-11-07 NOTE — Progress Notes (Signed)
Per orders of Dr. [name], injection of Vitamin b12 1000 mcg given right deltoid IM by Kevan Ny, CMA  Patient tolerated injection well.  She will return in 1 week for her next injection.

## 2018-11-08 ENCOUNTER — Encounter: Payer: Self-pay | Admitting: Physician Assistant

## 2018-11-09 ENCOUNTER — Other Ambulatory Visit: Payer: Self-pay | Admitting: Family Medicine

## 2018-11-10 DIAGNOSIS — H9203 Otalgia, bilateral: Secondary | ICD-10-CM | POA: Insufficient documentation

## 2018-11-14 ENCOUNTER — Other Ambulatory Visit: Payer: Self-pay

## 2018-11-14 ENCOUNTER — Ambulatory Visit (INDEPENDENT_AMBULATORY_CARE_PROVIDER_SITE_OTHER): Payer: Medicare Other

## 2018-11-14 DIAGNOSIS — E538 Deficiency of other specified B group vitamins: Secondary | ICD-10-CM

## 2018-11-14 MED ORDER — CYANOCOBALAMIN 1000 MCG/ML IJ SOLN
1000.0000 ug | Freq: Once | INTRAMUSCULAR | Status: AC
  Administered 2018-11-14: 1000 ug via INTRAMUSCULAR

## 2018-11-14 NOTE — Progress Notes (Signed)
Per orders of Dr.Wallace, injection of B12 given in right deltoid by Sandford Craze, RN  Patient tolerated injection well.

## 2018-11-15 ENCOUNTER — Encounter: Payer: Self-pay | Admitting: Family Medicine

## 2018-11-16 ENCOUNTER — Ambulatory Visit: Payer: Self-pay

## 2018-11-16 ENCOUNTER — Ambulatory Visit (INDEPENDENT_AMBULATORY_CARE_PROVIDER_SITE_OTHER): Payer: Medicare Other

## 2018-11-16 ENCOUNTER — Other Ambulatory Visit: Payer: Self-pay

## 2018-11-16 ENCOUNTER — Ambulatory Visit (INDEPENDENT_AMBULATORY_CARE_PROVIDER_SITE_OTHER): Payer: Medicare Other | Admitting: Physical Medicine and Rehabilitation

## 2018-11-16 VITALS — BP 137/80 | HR 61 | Ht 66.0 in | Wt 175.0 lb

## 2018-11-16 DIAGNOSIS — M546 Pain in thoracic spine: Secondary | ICD-10-CM

## 2018-11-16 DIAGNOSIS — M5441 Lumbago with sciatica, right side: Secondary | ICD-10-CM | POA: Diagnosis not present

## 2018-11-16 DIAGNOSIS — M542 Cervicalgia: Secondary | ICD-10-CM

## 2018-11-16 DIAGNOSIS — M5442 Lumbago with sciatica, left side: Secondary | ICD-10-CM

## 2018-11-16 DIAGNOSIS — E039 Hypothyroidism, unspecified: Secondary | ICD-10-CM

## 2018-11-16 NOTE — Progress Notes (Signed)
Pt states a burning pain across the upper back, middle back, and abdomen. Pt also states numbness in both lower legs. Pt states pain started middle of April 2020. Pt states she is going to see a neurologist on 11/22/2018. Pt states gabapentin helps with pain, nothing makes pain worse it is constant.   .Numeric Pain Rating Scale and Functional Assessment Average Pain 7 Pain Right Now 3 My pain is constant and burning Pain is worse with: some activites Pain improves with: medication   In the last MONTH (on 0-10 scale) has pain interfered with the following?  1. General activity like being  able to carry out your everyday physical activities such as walking, climbing stairs, carrying groceries, or moving a chair?  Rating(8)  2. Relation with others like being able to carry out your usual social activities and roles such as  activities at home, at work and in your community. Rating(8)  3. Enjoyment of life such that you have  been bothered by emotional problems such as feeling anxious, depressed or irritable?  Rating(3)

## 2018-11-21 ENCOUNTER — Encounter: Payer: Self-pay | Admitting: Neurology

## 2018-11-22 ENCOUNTER — Telehealth (INDEPENDENT_AMBULATORY_CARE_PROVIDER_SITE_OTHER): Payer: Medicare Other | Admitting: Neurology

## 2018-11-22 ENCOUNTER — Other Ambulatory Visit: Payer: Self-pay

## 2018-11-22 VITALS — Ht 66.0 in | Wt 172.0 lb

## 2018-11-22 DIAGNOSIS — R202 Paresthesia of skin: Secondary | ICD-10-CM | POA: Diagnosis not present

## 2018-11-22 DIAGNOSIS — E538 Deficiency of other specified B group vitamins: Secondary | ICD-10-CM | POA: Diagnosis not present

## 2018-11-22 NOTE — Progress Notes (Signed)
Please schedule EMG thanks

## 2018-11-22 NOTE — Progress Notes (Signed)
   Virtual Visit via Video Note The purpose of this virtual visit is to provide medical care while limiting exposure to the novel coronavirus.    Consent was obtained for video visit:  Yes.   Answered questions that patient had about telehealth interaction:  Yes.   I discussed the limitations, risks, security and privacy concerns of performing an evaluation and management service by telemedicine. I also discussed with the patient that there may be a patient responsible charge related to this service. The patient expressed understanding and agreed to proceed.  Pt location: Home Physician Location: office Name of referring provider:  Briscoe Deutscher, DO I connected with Latasha Mclaughlin at patients initiation/request on 11/22/2018 at 10:30 AM EDT by video enabled telemedicine application and verified that I am speaking with the correct person using two identifiers. Pt MRN:  [id number] Pt DOB:  05/18/1945 Video Participants:  Latasha Mclaughlin   History of Present Illness: This is a 73 y.o. female returning for follow-up of paresthesias of the legs in the setting of vitamin B12 deficiency.  She has been compliant getting vitamin B12 injections and has noticed improved paresthesias over her abdomen, however continues to have numbness and burning in the lower legs and feet.  Symptoms are constant and somewhat alleviated with gabapentin 300 mg 3 times daily.  She denies any weakness, imbalance, or low back pain.  Observations/Objective:   Vitals:   11/21/18 1428  Weight: 172 lb (78 kg)  Height: 5\' 6"  (1.676 m)   Patient is awake, alert, and appears comfortable.  Oriented x 4.   Extraocular muscles are intact. No ptosis.  Face is symmetric.  Speech is not dysarthric. Tongue is midline. Antigravity in all extremities.  No pronator drift. Gait appears normal.   Assessment and Plan:  Bilateral leg paresthesias in the setting of vitamin B12 deficiency.  Proximal paresthesias have improved with taking  vitamin B12, however she continues to report distal symptoms.  She does get adequate relief with gabapentin 300 mg 3 times daily, but is concerned that they are still present.  I informed her that symptoms may certainly still be due to B12 deficiency and will take some time to improve.  I will go ahead and bring her back to the office for EMG of the legs to evaluate for neuropathy, check additional labs and formal neurological testing.  Vitamin B12 deficiency, continue monthly injections.   Follow Up Instructions:   I discussed the assessment and treatment plan with the patient. The patient was provided an opportunity to ask questions and all were answered. The patient agreed with the plan and demonstrated an understanding of the instructions.   The patient was advised to call back or seek an in-person evaluation if the symptoms worsen or if the condition fails to improve as anticipated.   Latasha Berthold, DO

## 2018-11-24 ENCOUNTER — Encounter: Payer: Self-pay | Admitting: Family Medicine

## 2018-11-29 NOTE — Progress Notes (Signed)
Noted and agree. Jelicia Nantz, DO 

## 2018-12-11 ENCOUNTER — Encounter: Payer: Self-pay | Admitting: Family Medicine

## 2018-12-12 ENCOUNTER — Other Ambulatory Visit: Payer: Self-pay

## 2018-12-12 ENCOUNTER — Ambulatory Visit (INDEPENDENT_AMBULATORY_CARE_PROVIDER_SITE_OTHER): Payer: Medicare Other

## 2018-12-12 DIAGNOSIS — E538 Deficiency of other specified B group vitamins: Secondary | ICD-10-CM

## 2018-12-12 MED ORDER — CYANOCOBALAMIN 1000 MCG/ML IJ SOLN
1000.0000 ug | Freq: Once | INTRAMUSCULAR | Status: AC
  Administered 2018-12-12: 1000 ug via INTRAMUSCULAR

## 2018-12-12 NOTE — Progress Notes (Signed)
Per orders of Dr. [name], injection of Vitamin b12 1000 mcg given left deltoid IM by Naoma Diener Patient tolerated injection well.  She will return in 1 month for her next injection

## 2018-12-26 ENCOUNTER — Ambulatory Visit (INDEPENDENT_AMBULATORY_CARE_PROVIDER_SITE_OTHER): Payer: Medicare Other | Admitting: Neurology

## 2018-12-26 ENCOUNTER — Other Ambulatory Visit: Payer: Self-pay

## 2018-12-26 DIAGNOSIS — R202 Paresthesia of skin: Secondary | ICD-10-CM | POA: Diagnosis not present

## 2018-12-26 NOTE — Procedures (Signed)
[facility]  [place], [address]  [place], [place] [address] Tel: [phone] Fax:  [phone] Test Date:  12/26/2018  Patient: Latasha Mclaughlin DOB: 1946/04/12 Physician: Narda Amber, DO  Sex: Female Height: 5\' 6"  Ref Phys: Narda Amber, DO  ID#: [id number] Temp: 32.0C Technician:    Patient Complaints: This is a 73 year old female with vitamin B-12 deficiency referred for evaluation of bilateral leg numbness.  NCV & EMG Findings: Extensive electrodiagnostic testing of the right lower extremity and additional studies of the left shows:  1. Bilateral sural and superficial peroneal sensory responses are within normal limits. 2. Bilateral tibial and peroneal motor responses are within normal limits. 3. Bilateral tibial H reflex studies are within normal limits.   4. Chronic motor axonal loss changes are seen affecting the L4 myotomes bilaterally, without accompanied active denervation.  Impression: 1. 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 2. There is no evidence of large fiber sensorimotor polyneuropathy affecting the lower extremities.   ___________________________ Narda Amber, DO    Nerve Conduction Studies Anti Sensory Summary Table   Site NR Peak (ms) Norm Peak (ms) P-T Amp (V) Norm P-T Amp  Left Sup Peroneal Anti Sensory (Ant Lat Mall)  32C  12 cm    2.8 <4.6 6.0 >3  Right Sup Peroneal Anti Sensory (Ant Lat Mall)  32C  12 cm    2.8 <4.6 6.0 >3  Left Sural Anti Sensory (Lat Mall)  32C  Calf    2.1 <4.6 15.1 >3  Right Sural Anti Sensory (Lat Mall)  32C  Calf    2.6 <4.6 18.2 >3   Motor Summary Table   Site NR Onset (ms) Norm Onset (ms) O-P Amp (mV) Norm O-P Amp Site1 Site2 Delta-0 (ms) Dist (cm) Vel (m/s) Norm Vel (m/s)  Left Peroneal Motor (Ext Dig Brev)  32C  Ankle    4.0 <6.0 3.2 >2.5 B Fib Ankle 8.3 36.0 43 >40  B Fib    12.3  2.8  Poplt B Fib 1.8 8.0 44 >40  Poplt    14.1  2.9          Right Peroneal Motor (Ext Dig Brev)  32C  Ankle    3.4 <6.0 5.7 >2.5 B Fib Ankle 8.0 37.0 46 >40  B Fib    11.4  5.0  Poplt B Fib 1.8 8.0 44 >40  Poplt    13.2  4.7         Left Tibial Motor (Abd Hall Brev)  32C  Ankle    3.5 <6.0 7.5 >4 Knee Ankle 9.0 39.0 43 >40  Knee    12.5  5.9         Right Tibial Motor (Abd Hall Brev)  32C  Ankle    3.9 <6.0 9.1 >4 Knee Ankle 9.1 40.0 44 >40  Knee    13.0  7.1          H Reflex Studies   NR H-Lat (ms) Lat Norm (ms) L-R H-Lat (ms)  Left Tibial (Gastroc)  32C     34.83 <35 0.41  Right Tibial (Gastroc)  32C     34.42 <35 0.41   EMG   Side Muscle Ins Act Fibs Psw Fasc Number Recrt Dur Dur. Amp Amp. Poly Poly. Comment  Right AntTibialis Nml Nml Nml Nml 1- Rapid Some 1+ Some 1+ Nml Nml N/A  Right AdductorLong Nml Nml Nml Nml Nml Nml Nml Nml Nml Nml Nml Nml  N/A  Right Gastroc Nml Nml Nml Nml Nml Nml Nml Nml Nml Nml Nml Nml N/A  Right Flex Dig Long Nml Nml Nml Nml Nml Nml Nml Nml Nml Nml Nml Nml N/A  Right RectFemoris Nml Nml Nml Nml 1- Rapid Some 1+ Some 1+ Nml Nml N/A  Right GluteusMed Nml Nml Nml Nml Nml Nml Nml Nml Nml Nml Nml Nml N/A  Left AdductorLong Nml Nml Nml Nml Nml Nml Nml Nml Nml Nml Nml Nml N/A  Left AntTibialis Nml Nml Nml Nml 1- Rapid Some 1+ Some 1+ Nml Nml N/A  Left Gastroc Nml Nml Nml Nml Nml Nml Nml Nml Nml Nml Nml Nml N/A  Left Flex Dig Long Nml Nml Nml Nml Nml Nml Nml Nml Nml Nml Nml Nml N/A  Left RectFemoris Nml Nml Nml Nml 1- Rapid Some 1+ Some 1+ Nml Nml N/A  Left GluteusMed Nml Nml Nml Nml Nml Nml Nml Nml Nml Nml Nml Nml N/A      Waveforms:

## 2018-12-26 NOTE — Progress Notes (Signed)
Follow-up Visit   Date: 12/26/18   Latasha Mclaughlin MRN: [id number] DOB: 19-Jun-1945   Interim History: Latasha Mclaughlin is a 73 y.o.  Caucasian female with vitamin B12 deficiency returning to the clinic for follow-up of bilateral leg numbness.  The patient was accompanied to the clinic by self.  UPDATE 12/26/2018:  She is here for follow-up and EDX due to ongoing numbness over the lower legs bilaterally.  She also complains of intermittent burning pain which is alleviated with gabapentin 300mg  three times daily. She denies any low back pain, leg weakness, or imbalance.  She continues to complain of post-prandial burning pain over the abdomen and is being evaluated by GI for this.   Medications:  Current Outpatient Medications on File Prior to Visit  Medication Sig Dispense Refill  . azelastine (ASTELIN) 0.1 % nasal spray Place 2 sprays into both nostrils 2 (two) times daily. Use in each nostril as directed 30 mL 6  . carboxymethylcellulose 1 % ophthalmic solution Apply 1 drop to eye 3 (three) times daily. 30 mL 12  . Cholecalciferol (VITAMIN D) 50 MCG (2000 UT) tablet Take 2,000 Units by mouth daily.    Marland Kitchen esomeprazole (NEXIUM) 20 MG capsule Take 20 mg by mouth daily at 12 noon.    . gabapentin (NEURONTIN) 100 MG capsule TAKE 1 TO 3 CAPSULES BY MOUTH THREE TIMES DAILY AS NEEDED OR AS DIRECTED 270 capsule 1  . levothyroxine (SYNTHROID) 100 MCG tablet Take 100 mcg by mouth daily before breakfast.    . levothyroxine (SYNTHROID) 88 MCG tablet Take by mouth daily.    . rosuvastatin (CRESTOR) 10 MG tablet Take 10 mg by mouth daily.     No current facility-administered medications on file prior to visit.     Allergies:  Allergies  Allergen Reactions  . Prevacid [Lansoprazole] Rash  . Doxycycline Other (See Comments)    Burning     Review of Systems:  CONSTITUTIONAL: No fevers, chills, night sweats, or weight loss.  EYES: No visual changes or eye pain ENT: No hearing changes.  No  history of nose bleeds.   RESPIRATORY: No cough, wheezing and shortness of breath.   CARDIOVASCULAR: Negative for chest pain, and palpitations.   GI: Negative for abdominal discomfort, blood in stools or black stools.  No recent change in bowel habits.   GU:  No history of incontinence.   MUSCLOSKELETAL: No history of joint pain or swelling.  No myalgias.   SKIN: Negative for lesions, rash, and itching.   ENDOCRINE: Negative for cold or heat intolerance, polydipsia or goiter.   PSYCH:  No depression or anxiety symptoms.   NEURO: As Above.    Neurological Exam: MENTAL STATUS including orientation to time, place, person, recent and remote memory, attention span and concentration, language, and fund of knowledge is normal.  Speech is not dysarthric.  CRANIAL NERVES:  Face is symmetric.   MOTOR:  Motor strength is 5/5 in all extremities, including distally in the feet.  No atrophy, fasciculations or abnormal movements.  No pronator drift.  Tone is normal.    MSRs:  Reflexes are 2+/4 throughout.  SENSORY: Reduced pin prick and temperature distal to mid-calf bilaterally.Marland Kitchen  COORDINATION/GAIT:   Gait narrow based and stable.   Data: NCS/EMG of the legs 12/26/2018: 1. 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 2. There is no evidence of large fiber sensorimotor polyneuropathy affecting the lower extremities.  IMPRESSION/PLAN: Bilateral leg paresthesias involving the lower  legs and feet.  EDX performed today does not show findings of neuropathy.  There is evidence of bilateral L4 radiculopathy and it's possible she is having sensory symptoms related to this.  I will refer her to PT for low back strengthening.  If no improvement with PT, the next step his MRI lumbar spine.   For pain, continue gabapentin 300mg  TID.     Vitamin B12 deficiency, continue monthly injections.  Some of her paresthesias may still be related to this and may improve  with time.   Return to clinic in 3 months.    Thank you for allowing me to participate in patient's care.  If I can answer any additional questions, I would be pleased to do so.    Sincerely,    Midas Daughety K. Posey Pronto, DO

## 2019-01-01 ENCOUNTER — Encounter: Payer: Self-pay | Admitting: Family Medicine

## 2019-01-03 ENCOUNTER — Other Ambulatory Visit: Payer: Self-pay

## 2019-01-03 DIAGNOSIS — R5383 Other fatigue: Secondary | ICD-10-CM

## 2019-01-03 DIAGNOSIS — Z8639 Personal history of other endocrine, nutritional and metabolic disease: Secondary | ICD-10-CM

## 2019-01-03 DIAGNOSIS — E039 Hypothyroidism, unspecified: Secondary | ICD-10-CM

## 2019-01-03 DIAGNOSIS — R200 Anesthesia of skin: Secondary | ICD-10-CM

## 2019-01-03 DIAGNOSIS — M791 Myalgia, unspecified site: Secondary | ICD-10-CM

## 2019-01-03 DIAGNOSIS — E663 Overweight: Secondary | ICD-10-CM

## 2019-01-03 NOTE — Telephone Encounter (Signed)
I spoke with Mrs. Kuhrt and informed her of Dr. Alcario Drought response.  Pt on lab schedule and labs are in.

## 2019-01-04 ENCOUNTER — Other Ambulatory Visit: Payer: Self-pay

## 2019-01-04 ENCOUNTER — Encounter: Payer: Self-pay | Admitting: Family Medicine

## 2019-01-04 DIAGNOSIS — R1013 Epigastric pain: Secondary | ICD-10-CM

## 2019-01-04 NOTE — Progress Notes (Signed)
refer

## 2019-01-05 ENCOUNTER — Encounter: Payer: Self-pay | Admitting: Family Medicine

## 2019-01-12 ENCOUNTER — Telehealth: Payer: Self-pay | Admitting: Family Medicine

## 2019-01-12 DIAGNOSIS — M79671 Pain in right foot: Secondary | ICD-10-CM | POA: Diagnosis not present

## 2019-01-12 DIAGNOSIS — M722 Plantar fascial fibromatosis: Secondary | ICD-10-CM | POA: Diagnosis not present

## 2019-01-12 NOTE — Telephone Encounter (Signed)
She is inquiring about the status of her Gabapentin refill - she said the pharmacy should have faxed something to our office a few days ago.  Pharmacy - [facility] on [address] in [place]   Please return her call.

## 2019-01-15 ENCOUNTER — Other Ambulatory Visit: Payer: Self-pay

## 2019-01-15 MED ORDER — GABAPENTIN 100 MG PO CAPS: ORAL_CAPSULE | ORAL | 1 refills | Status: DC

## 2019-01-15 NOTE — Telephone Encounter (Signed)
meds sent in called patient and let her know.

## 2019-01-16 ENCOUNTER — Other Ambulatory Visit (INDEPENDENT_AMBULATORY_CARE_PROVIDER_SITE_OTHER): Payer: Medicare Other

## 2019-01-16 ENCOUNTER — Other Ambulatory Visit: Payer: Medicare Other

## 2019-01-16 ENCOUNTER — Other Ambulatory Visit: Payer: Self-pay

## 2019-01-16 ENCOUNTER — Ambulatory Visit (INDEPENDENT_AMBULATORY_CARE_PROVIDER_SITE_OTHER): Payer: Medicare Other

## 2019-01-16 DIAGNOSIS — R194 Change in bowel habit: Secondary | ICD-10-CM

## 2019-01-16 DIAGNOSIS — R1013 Epigastric pain: Secondary | ICD-10-CM | POA: Diagnosis not present

## 2019-01-16 DIAGNOSIS — R748 Abnormal levels of other serum enzymes: Secondary | ICD-10-CM

## 2019-01-16 DIAGNOSIS — M791 Myalgia, unspecified site: Secondary | ICD-10-CM | POA: Diagnosis not present

## 2019-01-16 DIAGNOSIS — E538 Deficiency of other specified B group vitamins: Secondary | ICD-10-CM | POA: Diagnosis not present

## 2019-01-16 DIAGNOSIS — R5383 Other fatigue: Secondary | ICD-10-CM

## 2019-01-16 DIAGNOSIS — R200 Anesthesia of skin: Secondary | ICD-10-CM

## 2019-01-16 LAB — VITAMIN B12: Vitamin B-12: 344 pg/mL (ref 211–911)

## 2019-01-16 LAB — CBC WITH DIFFERENTIAL/PLATELET
Basophils Absolute: 0 10*3/uL (ref 0.0–0.1)
Basophils Relative: 0.7 % (ref 0.0–3.0)
Eosinophils Absolute: 0.1 10*3/uL (ref 0.0–0.7)
Eosinophils Relative: 1.6 % (ref 0.0–5.0)
HCT: 42.4 % (ref 36.0–46.0)
Hemoglobin: 14.3 g/dL (ref 12.0–15.0)
Lymphocytes Relative: 34.8 % (ref 12.0–46.0)
Lymphs Abs: 2.2 10*3/uL (ref 0.7–4.0)
MCHC: 33.8 g/dL (ref 30.0–36.0)
MCV: 92.3 fl (ref 78.0–100.0)
Monocytes Absolute: 0.6 10*3/uL (ref 0.1–1.0)
Monocytes Relative: 9.3 % (ref 3.0–12.0)
Neutro Abs: 3.4 10*3/uL (ref 1.4–7.7)
Neutrophils Relative %: 53.6 % (ref 43.0–77.0)
Platelets: 266 10*3/uL (ref 150.0–400.0)
RBC: 4.59 Mil/uL (ref 3.87–5.11)
RDW: 12.6 % (ref 11.5–15.5)
WBC: 6.4 10*3/uL (ref 4.0–10.5)

## 2019-01-16 LAB — COMPREHENSIVE METABOLIC PANEL
ALT: 14 U/L (ref 0–35)
AST: 14 U/L (ref 0–37)
Albumin: 4.3 g/dL (ref 3.5–5.2)
Alkaline Phosphatase: 57 U/L (ref 39–117)
BUN: 10 mg/dL (ref 6–23)
CO2: 31 mEq/L (ref 19–32)
Calcium: 10.2 mg/dL (ref 8.4–10.5)
Chloride: 104 mEq/L (ref 96–112)
Creatinine, Ser: 0.87 mg/dL (ref 0.40–1.20)
GFR: 63.88 mL/min (ref >60.00)
Glucose, Bld: 91 mg/dL (ref 70–99)
Potassium: 4.4 mEq/L (ref 3.5–5.1)
Sodium: 142 mEq/L (ref 135–145)
Total Bilirubin: 0.8 mg/dL (ref 0.2–1.2)
Total Protein: 6.4 g/dL (ref 6.0–8.3)

## 2019-01-16 LAB — MAGNESIUM: Magnesium: 2.1 mg/dL (ref 1.5–2.5)

## 2019-01-16 MED ORDER — CYANOCOBALAMIN 1000 MCG/ML IJ SOLN
1000.0000 ug | Freq: Once | INTRAMUSCULAR | Status: AC
  Administered 2019-01-16: 1000 ug via INTRAMUSCULAR

## 2019-01-16 NOTE — Progress Notes (Signed)
Per orders of Dr. [name], injection of Vitamin b12, 1000 mcg given right deltoid IM by Kevan Ny, CMA Patient tolerated injection well.  She will return in 1 month for her next injection.

## 2019-01-19 LAB — VITAMIN B1: Vitamin B1 (Thiamine): 8 nmol/L (ref 8–30)

## 2019-01-19 LAB — COPPER, SERUM: Copper: 82 ug/dL (ref 70–175)

## 2019-01-19 LAB — VITAMIN B6: Vitamin B6: 7.6 ng/mL (ref 2.1–21.7)

## 2019-01-23 ENCOUNTER — Ambulatory Visit (INDEPENDENT_AMBULATORY_CARE_PROVIDER_SITE_OTHER): Payer: Medicare Other | Admitting: Physician Assistant

## 2019-01-23 ENCOUNTER — Other Ambulatory Visit (INDEPENDENT_AMBULATORY_CARE_PROVIDER_SITE_OTHER): Payer: Medicare Other

## 2019-01-23 ENCOUNTER — Encounter: Payer: Self-pay | Admitting: Physician Assistant

## 2019-01-23 VITALS — BP 128/80 | HR 68 | Temp 98.1°F | Ht 66.0 in | Wt 174.0 lb

## 2019-01-23 DIAGNOSIS — R109 Unspecified abdominal pain: Secondary | ICD-10-CM | POA: Diagnosis not present

## 2019-01-23 DIAGNOSIS — K449 Diaphragmatic hernia without obstruction or gangrene: Secondary | ICD-10-CM

## 2019-01-23 LAB — BASIC METABOLIC PANEL
BUN: 11 mg/dL (ref 6–23)
CO2: 28 mEq/L (ref 19–32)
Calcium: 10.3 mg/dL (ref 8.4–10.5)
Chloride: 106 mEq/L (ref 96–112)
Creatinine, Ser: 0.87 mg/dL (ref 0.40–1.20)
GFR: 63.88 mL/min (ref >60.00)
Glucose, Bld: 107 mg/dL — ABNORMAL HIGH (ref 70–99)
Potassium: 4.2 mEq/L (ref 3.5–5.1)
Sodium: 141 mEq/L (ref 135–145)

## 2019-01-23 MED ORDER — ESOMEPRAZOLE MAGNESIUM 40 MG PO CPDR: 40.0000 mg | DELAYED_RELEASE_CAPSULE | Freq: Every day | ORAL | 11 refills | Status: DC

## 2019-01-23 NOTE — Patient Instructions (Signed)
If you are age 72 or older, your body mass index should be between 23-30. Your Body mass index is 28.08 kg/m. If this is out of the aforementioned range listed, please consider follow up with your Primary Care Provider.  If you are age 27 or younger, your body mass index should be between 19-25. Your Body mass index is 28.08 kg/m. If this is out of the aformentioned range listed, please consider follow up with your Primary Care Provider.   Your provider has requested that you go to the basement level for lab work before leaving today. Press "B" on the elevator. The lab is located at the first door on the left as you exit the elevator.   You have been scheduled for a CT scan of the abdomen and pelvis   You are scheduled on 02-05-2019 at 4pm. You should arrive 15 minutes prior to your appointment time for registration. Please follow the written instructions below on the day of your exam:  WARNING: IF YOU ARE ALLERGIC TO IODINE/X-RAY DYE, PLEASE NOTIFY RADIOLOGY IMMEDIATELY AT [phone]! YOU WILL BE GIVEN A 13 HOUR PREMEDICATION PREP.  1) Do not eat or drink anything after 12/noon  (4 hours prior to your test) 2) You have been given 2 bottles of oral contrast to drink. The solution may taste better if refrigerated, but do NOT add ice or any other liquid to this solution. Shake well before drinking.    Drink 1 bottle of contrast @ 2pm (2 hours prior to your exam)  Drink 1 bottle of contrast @ 3pm (1 hour prior to your exam)  You may take any medications as prescribed with a small amount of water, if necessary. If you take any of the following medications: METFORMIN, GLUCOPHAGE, GLUCOVANCE, AVANDAMET, RIOMET, FORTAMET, [place] MET, JANUMET, GLUMETZA or METAGLIP, you MAY be asked to HOLD this medication 48 hours AFTER the exam.  The purpose of you drinking the oral contrast is to aid in the visualization of your intestinal tract. The contrast solution may cause some diarrhea. Depending on your  individual set of symptoms, you may also receive an intravenous injection of x-ray contrast/dye. Plan on being at [facility] for 30 minutes or longer, depending on the type of exam you are having performed.  This test typically takes 30-45 minutes to complete.  If you have any questions regarding your exam or if you need to reschedule, you may call the CT department at [phone] between the hours of 8:00 am and 5:00 pm, Monday-Friday.  ________________________________________________________________________  Dennis Bast have been scheduled for a Barium Esophogram at [facility] on 01-29-2019 at 930am. Please arrive 15 minutes prior to your appointment for registration. Make certain not to have anything to eat or drink 3 hours prior to your test. If you need to reschedule for any reason, please contact radiology at [phone] to do so. __________________________________________________________________ A barium swallow is an examination that concentrates on views of the esophagus. This tends to be a double contrast exam (barium and two liquids which, when combined, create a gas to distend the wall of the oesophagus) or single contrast (non-ionic iodine based). The study is usually tailored to your symptoms so a good history is essential. Attention is paid during the study to the form, structure and configuration of the esophagus, looking for functional disorders (such as aspiration, dysphagia, achalasia, motility and reflux) EXAMINATION You may be asked to change into a gown, depending on the type of swallow being performed. A radiologist  and radiographer will perform the procedure. The radiologist will advise you of the type of contrast selected for your procedure and direct you during the exam. You will be asked to stand, sit or lie in several different positions and to hold a small amount of fluid in your mouth before being asked to swallow while the imaging is performed .In some  instances you may be asked to swallow barium coated marshmallows to assess the motility of a solid food bolus. The exam can be recorded as a digital or video fluoroscopy procedure. POST PROCEDURE It will take 1-2 days for the barium to pass through your system. To facilitate this, it is important, unless otherwise directed, to increase your fluids for the next 24-48hrs and to resume your normal diet.  This test typically takes about 30 minutes to perform. __________________________________________________________________________________ Dennis Bast have been scheduled for an Upper GI Series at [facility]. Your appointment is on 01-29-2019 at [place]. Please arrive 15 minutes prior to your test for registration. Make sure not to eat or drink anything after midnight on the night before your test. If you need to reschedule, please call radiology at [phone]. ________________________________________________________________ An upper GI series uses x rays to help diagnose problems of the upper GI tract, which includes the esophagus, stomach, and duodenum. The duodenum is the first part of the small intestine. An upper GI series is conducted by a radiology technologist or a radiologist-a doctor who specializes in x-ray imaging-at a hospital or outpatient center. While sitting or standing in front of an x-ray machine, the patient drinks barium liquid, which is often white and has a chalky consistency and taste. The barium liquid coats the lining of the upper GI tract and makes signs of disease show up more clearly on x rays. X-ray video, called fluoroscopy, is used to view the barium liquid moving through the esophagus, stomach, and duodenum. Additional x rays and fluoroscopy are performed while the patient lies on an x-ray table. To fully coat the upper GI tract with barium liquid, the technologist or radiologist may press on the abdomen or ask the patient to change position. Patients hold still in various  positions, allowing the technologist or radiologist to take x rays of the upper GI tract at different angles. If a technologist conducts the upper GI series, a radiologist will later examine the images to look for problems.  This test typically takes about 1 hour to complete. __________________________________________________________________

## 2019-01-23 NOTE — Progress Notes (Signed)
Subjective:    Patient ID: Latasha Mclaughlin, female    DOB: [birth year], 73 y.o.   MRN: [id number]  HPI Latasha Mclaughlin is a pleasant 73 year old female, known to Dr. Fuller Plan who comes in today with complaints of abdominal pain.  She has 2 separate pain complaints. She was last evaluated with colonoscopy and EGD earlier this summer.  EGD on 11/01/2018 showed a large hiatal hernia 6 to 7 cm and a benign distal stricture which was dilated.  Otherwise normal exam.  Colonoscopy June 2020 with removal of 2 small sessile polyps in the transverse colon, noted a 12 mm lipoma in the a sending colon and hemorrhoids.  Path on the polyps consistent with tubular adenomas. Today she comes in stating that she has been having a recurring very sharp epigastric pain which occurred 2 or 3 times in July, one episode in August and has not recurred since.  She says this is been excruciatingly painful, to the point that she feels like she cannot breathe during these episodes.  The pain will be located in the high epigastrium and sometimes radiates around into the right, it typically lasts 5 to 10 minutes and then resolves.  She has not had associated nausea or vomiting. Her second separate pain is a left abdominal burning type pain.  She says she feels like this portion of her abdomen is "on fire".  She feels that this is exacerbated postprandially, and this has been present for about 5 or 6 months.  She says she never had any similar pain prior to March.  She had to take a few courses of antibiotics for sinusitis and somehow relates the pain to starting after that.  She has been on Nexium 20 mg once daily chronically.    CT of the abdomen and pelvis had been done in April 2020 showing a large hiatal hernia with intrathoracic portion of the stomach, and atherosclerotic changes in the abdomen, otherwise negative.  She is frustrated by the burning pain because it is persisting.  She was placed on Neurontin by her PCP which she says helps but  does not completely alleviate the pain. She says she has been told that she had a bulging disc in the past, thoracic spine films from July 2020 showed degenerative disc changes T7-T9 but otherwise normal alignment.  She denies any back pain currently. Review of Systems Pertinent positive and negative review of systems were noted in the above HPI section.  All other review of systems was otherwise negative.  Outpatient Encounter Medications as of 01/23/2019  Medication Sig  . azelastine (ASTELIN) 0.1 % nasal spray Place 2 sprays into both nostrils 2 (two) times daily. Use in each nostril as directed  . carboxymethylcellulose 1 % ophthalmic solution Apply 1 drop to eye 3 (three) times daily.  . Cholecalciferol (VITAMIN D) 50 MCG (2000 UT) tablet Take 2,000 Units by mouth daily.  . Cyanocobalamin (VITAMIN B-12 IJ) Inject as directed every 30 (thirty) days.  Marland Kitchen gabapentin (NEURONTIN) 100 MG capsule TAKE 1 TO 3 CAPSULES BY MOUTH THREE TIMES DAILY AS NEEDED OR AS DIRECTED  . levothyroxine (SYNTHROID) 100 MCG tablet Take 100 mcg by mouth daily before breakfast.  . levothyroxine (SYNTHROID) 88 MCG tablet Take by mouth daily.  . rosuvastatin (CRESTOR) 10 MG tablet Take 10 mg by mouth daily.  . [DISCONTINUED] esomeprazole (NEXIUM) 20 MG capsule Take 20 mg by mouth daily at 12 noon.  Marland Kitchen esomeprazole (NEXIUM) 40 MG capsule Take 1 capsule (40 mg total)  by mouth daily at 12 noon.   No facility-administered encounter medications on file as of 01/23/2019.    Allergies  Allergen Reactions  . Prevacid [Lansoprazole] Rash  . Doxycycline Other (See Comments)    Burning    Patient Active Problem List   Diagnosis Date Noted  . Otalgia of both ears 11/10/2018  . Vitamin B12 deficiency 10/20/2018  . History of colonic polyps 11/18/2017  . Change in bowel habits 11/18/2017  . Abdominal pain, epigastric 11/18/2017  . History of hyperparathyroidism 01/08/2017  . Myalgia 01/05/2017  . Arthralgia 01/05/2017  .  Fatigue 01/05/2017  . Paresthesia 01/05/2017  . Overweight (BMI 25.0-29.9) 12/16/2016  . Hypothyroidism 12/15/2016  . Hyperlipidemia 12/15/2016   Social History   Socioeconomic History  . Marital status: Married    Spouse name: Not on file  . Number of children: Not on file  . Years of education: Not on file  . Highest education level: Not on file  Occupational History  . Not on file  Social Needs  . Financial resource strain: Not on file  . Food insecurity    Worry: Not on file    Inability: Not on file  . Transportation needs    Medical: Not on file    Non-medical: Not on file  Tobacco Use  . Smoking status: Former Research scientist (life sciences)  . Smokeless tobacco: Never Used  . Tobacco comment: Quit in 1985  Substance and Sexual Activity  . Alcohol use: No  . Drug use: No  . Sexual activity: Yes  Lifestyle  . Physical activity    Days per week: Not on file    Minutes per session: Not on file  . Stress: Not on file  Relationships  . Social Herbalist on phone: Not on file    Gets together: Not on file    Attends religious service: Not on file    Active member of club or organization: Not on file    Attends meetings of clubs or organizations: Not on file    Relationship status: Not on file  . Intimate partner violence    Fear of current or ex partner: Not on file    Emotionally abused: Not on file    Physically abused: Not on file    Forced sexual activity: Not on file  Other Topics Concern  . Not on file  Social History Narrative   Lives with husband      Lives in two story home      Right handed      Highest level of edu- GED      Retired    Ms. Murtaugh's family history includes Cancer in her father; Colon polyps in her father and sister; Congestive Heart Failure in her mother; Diabetes in her father and son; Drug abuse in her brother; Heart disease in her father and mother; Hyperlipidemia in her father and mother; Hypertension in her mother; Thyroid disease in  her sister.      Objective:    Vitals:   01/23/19 1337  BP: 128/80  Pulse: 68  Temp: 98.1 F (36.7 C)    Physical Exam Well-developed well-nourished older femalein no acute distress.  Height, Weight, 174 BMI 28.0  HEENT; nontraumatic normocephalic, EOMI, PER R LA, sclera anicteric. Oropharynx; not examined/mask/COVID neck; supple, no JVD Cardiovascular; regular rate and rhythm with S1-S2, no murmur rub or gallop Pulmonary; Clear bilaterally Abdomen; soft, nontender, nondistended, no palpable mass or hepatosplenomegaly, bowel sounds are active.  There  is specifically no appreciable tenderness over the left upper abdomen where she complains of burning, and no skin hypersensitivity, no rash or lesions. Rectal; not done  skin; benign exam, no jaundice rash or appreciable lesions Extremities; no clubbing cyanosis or edema skin warm and dry Neuro/Psych; alert and oriented x4, grossly nonfocal mood and affect appropriate       Assessment & Plan:   #42 73 year old white female with recent intermittent episodes of severe epigastric pain lasting 5 to 10 minutes, at times radiating around to the right. Etiology not clear, she does have a large hiatal hernia with intrathoracic portion of her stomach.  Wonder if she could be having intermittent partial volvulus, or symptoms secondary to paraesophageal hernia. Recent CT showed a normal-appearing gallbladder. Recent EGD unremarkable.  #2 left upper abdomen burning pain x5 to 6 months, of unclear etiology.  I suspect this is more of a radicular pain.  She has been placed on Neurontin with partial relief.  Patient is very concerned about intra-abdominal malignancy etc.  #3 chronic GERD 4.  History of adenomatous colon polyps-recent colonoscopy June 2020, will be due for 5-year interval follow-up  Plan; we will schedule for barium swallow/upper GI to further evaluate large hiatal hernia and episodes of intense epigastric pain.   Will schedule  for CT of the abdomen, and lower thoracic upper lumbar spine. Increase Nexium to 40 mg p.o. every morning Continue Neurontin 100 mg 3 times daily. Further plans pending results of above.  Amy S Esterwood PA-C 01/23/2019   Cc: Briscoe Deutscher, DO

## 2019-01-24 ENCOUNTER — Telehealth: Payer: Self-pay

## 2019-01-24 MED ORDER — OMEPRAZOLE 40 MG PO CPDR: DELAYED_RELEASE_CAPSULE | ORAL | 11 refills | Status: DC

## 2019-01-24 NOTE — Telephone Encounter (Signed)
Ok, lets switch her to omeprazole 40 mg po Qam  # [id number]

## 2019-01-24 NOTE — Telephone Encounter (Signed)
done

## 2019-01-24 NOTE — Progress Notes (Signed)
Reviewed and agree with management plan.  Torell Minder T. Aliscia Clayton, MD FACG [facility]  

## 2019-01-24 NOTE — Telephone Encounter (Signed)
Incoming fax from [facility]. Nexium is not covered with her insurance plan. Per pharmacy the preferred alternative is omeprazole. Please advise. Thank you

## 2019-01-29 ENCOUNTER — Ambulatory Visit (HOSPITAL_COMMUNITY)
Admission: RE | Admit: 2019-01-29 | Discharge: 2019-01-29 | Disposition: A | Discharge status: Home / Self Care | Payer: Medicare Other | Source: Ambulatory Visit | Attending: Physician Assistant | Admitting: Physician Assistant

## 2019-01-29 ENCOUNTER — Other Ambulatory Visit: Payer: Self-pay

## 2019-01-29 ENCOUNTER — Encounter (HOSPITAL_COMMUNITY): Payer: Self-pay

## 2019-01-29 DIAGNOSIS — K224 Dyskinesia of esophagus: Secondary | ICD-10-CM | POA: Diagnosis not present

## 2019-01-29 DIAGNOSIS — K449 Diaphragmatic hernia without obstruction or gangrene: Secondary | ICD-10-CM | POA: Insufficient documentation

## 2019-01-29 DIAGNOSIS — R109 Unspecified abdominal pain: Secondary | ICD-10-CM | POA: Insufficient documentation

## 2019-01-29 DIAGNOSIS — K219 Gastro-esophageal reflux disease without esophagitis: Secondary | ICD-10-CM | POA: Diagnosis not present

## 2019-01-29 IMAGING — CR DG UGI W SINGLE CM
11 of 16 series · 12 of 24 positions shown · non-contrast
Comparison: CT abdomen and pelvis [DATE]

CLINICAL DATA: Hiatal hernia, question para soft JAIME LEODAN knee a,
history acid reflux, LEFT side abdominal pain

EXAM:
UPPER GI SERIES WITH KUB
TECHNIQUE: After obtaining a scout radiograph a routine upper GI series was
performed using thin and high density barium. Patient also swallowed
a 12.5 mm diameter barium tablet. Additional targeted imaging of the
cervical esophagus and hypopharynx was performed.
FLUOROSCOPY TIME:  Fluoroscopy Time:  0 minutes 54 seconds
Radiation Exposure Index (if provided by the fluoroscopic device):
52.6 mGy
Number of Acquired Spot Images: 3 plus multiple fluoroscopic screen
captures

[t ap supine]
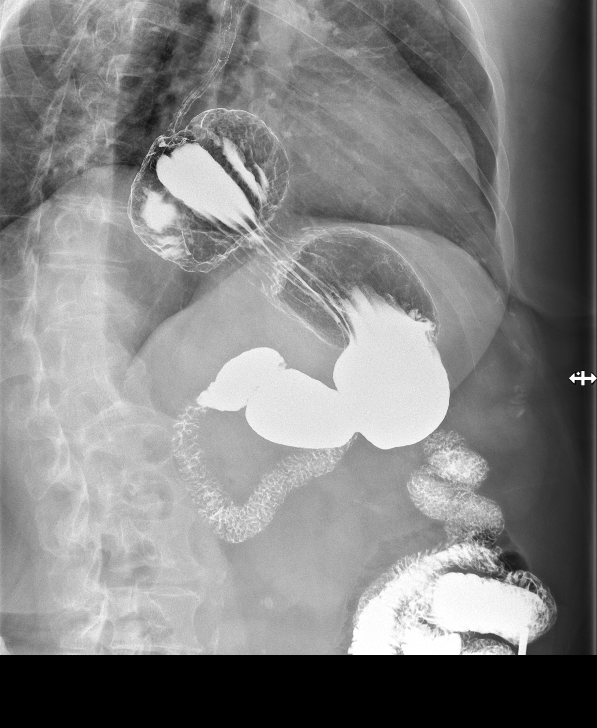

[cp_standard (1 of 10) · tomo slice 6/10.0]
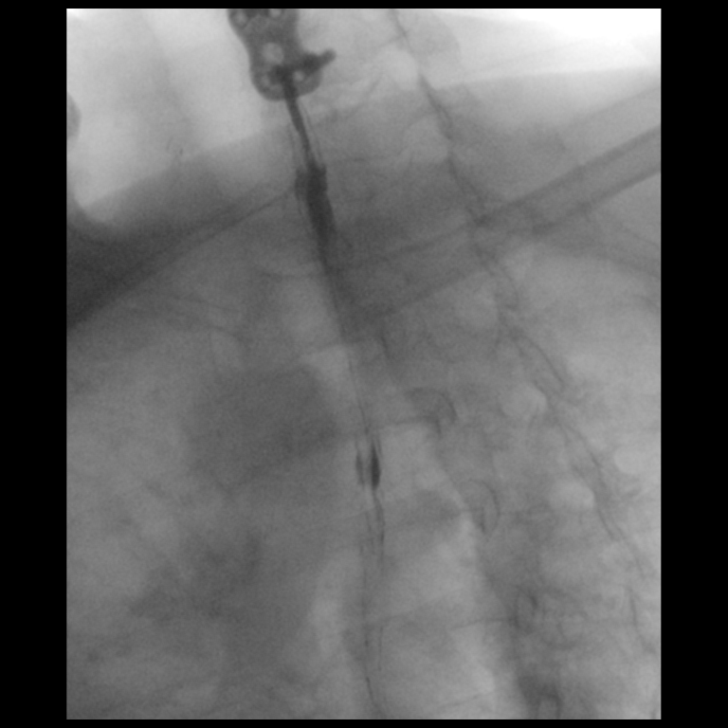

[cp_standard (2 of 10) · tomo slice 64/78.0]
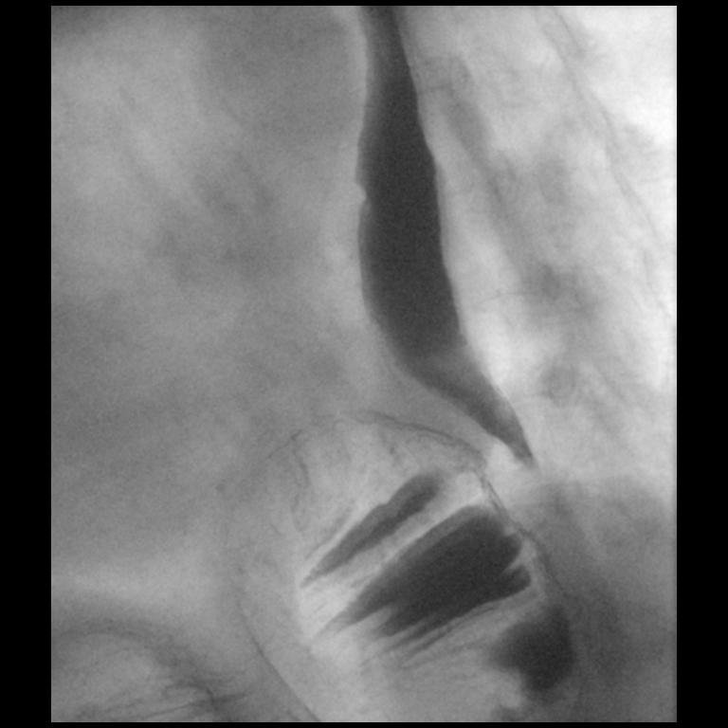

[cp_standard (3 of 10) · tomo slice 4/13.0]
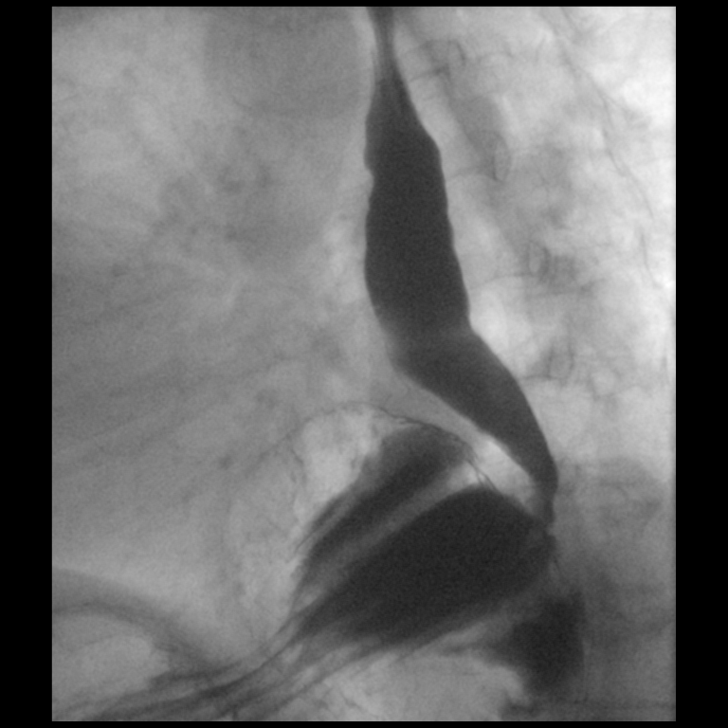

[cp_standard (4 of 10) · tomo slice 42/83.0]
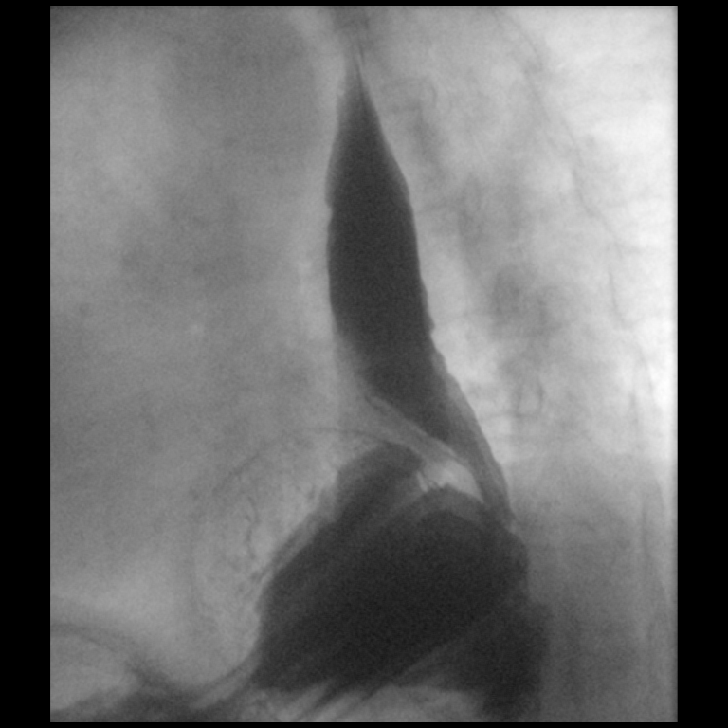

[cp_standard (5 of 10)]
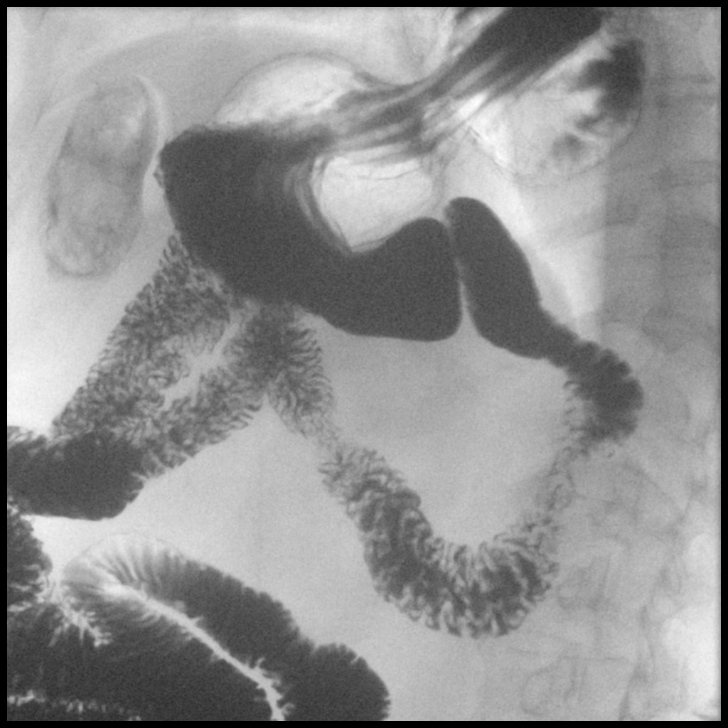

[cp_standard (6 of 10)]
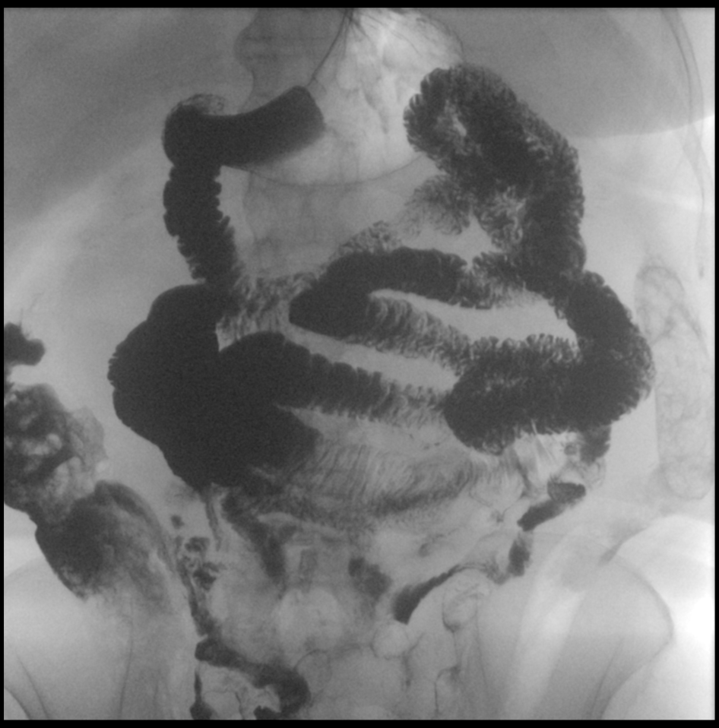

[cp_standard (7 of 10)]
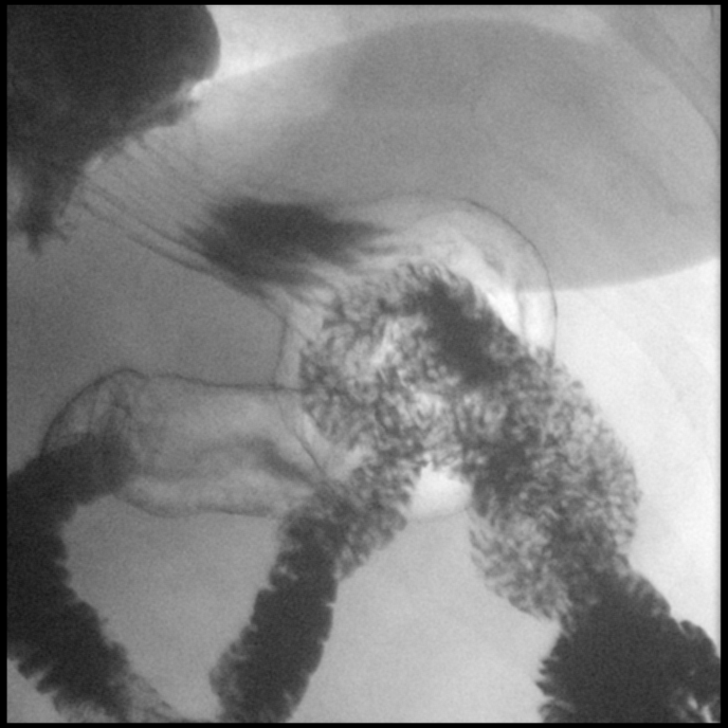

[cp_standard (8 of 10)]
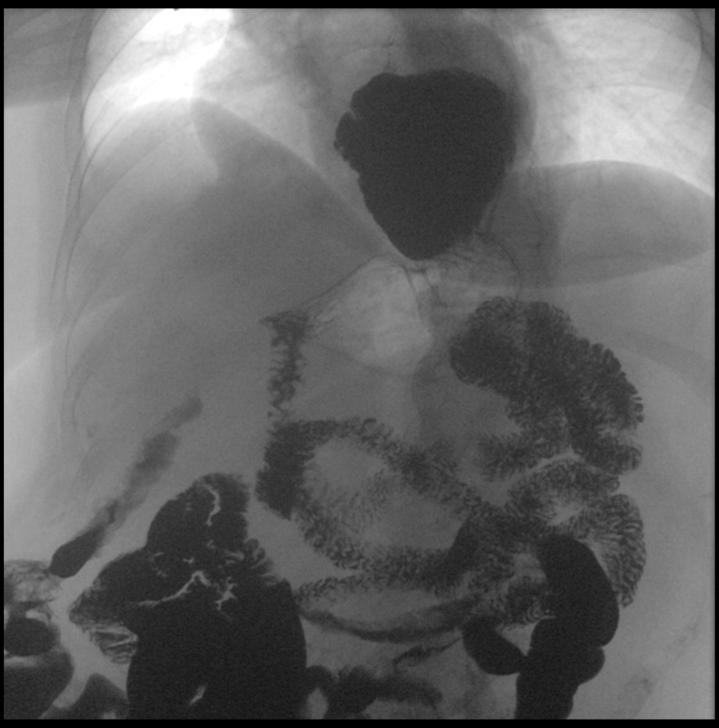

[Series 23: cp_standard · 0.26mm/px · 2 of 244 frames shown (9 of 10)]
[frame 31/244]
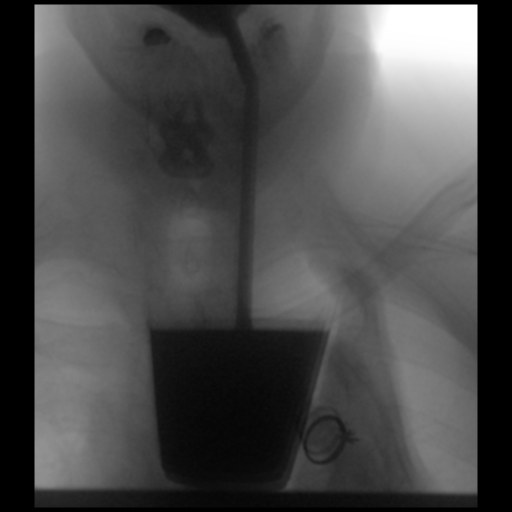
[frame 208/244]
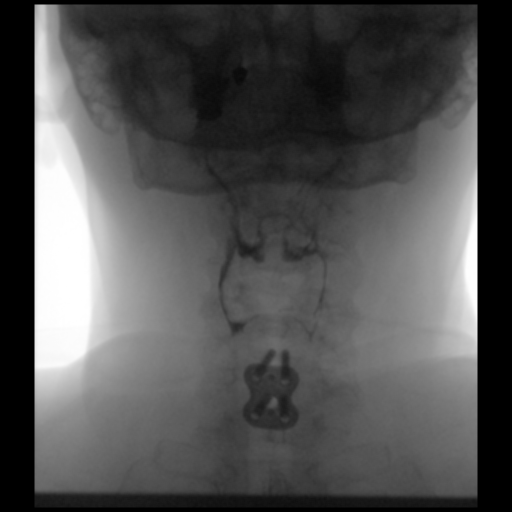

[cp_standard (10 of 10) · tomo slice 98/115.0]
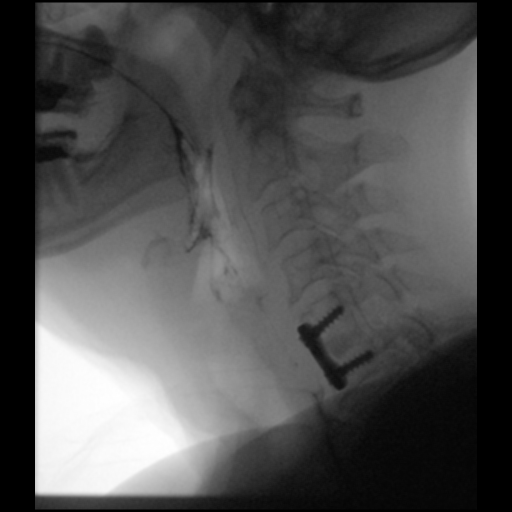

[12 of 24 positions shown; findings below may reference images not displayed]

FINDINGS: Normal esophageal distention and motility.

No esophageal mass or stricture.

12.5 mm diameter barium tablet passed from oral cavity to stomach
without obstruction.

Targeted rapid sequence imaging of the cervical esophagus and
hypopharynx showed no laryngeal penetration or aspiration.

Gastroesophageal reflux was seen during the exam into the distal
third of the esophagus.

Large hiatal hernia.

Stomach distends normally with normal rugal fold pattern.

No gastric mass, ulceration or outlet obstruction.

Duodenal bulb and sweep normal appearance.

Ligament of Treitz normal position.

Visualize jejunal loops unremarkable.
IMPRESSION: Large hiatal hernia with mild gastroesophageal reflux.

Otherwise negative exam.

## 2019-02-03 ENCOUNTER — Emergency Department (HOSPITAL_COMMUNITY): Payer: Medicare Other

## 2019-02-03 ENCOUNTER — Other Ambulatory Visit: Payer: Self-pay

## 2019-02-03 ENCOUNTER — Encounter (HOSPITAL_COMMUNITY): Payer: Self-pay

## 2019-02-03 ENCOUNTER — Emergency Department (HOSPITAL_COMMUNITY)
Admission: EM | Admit: 2019-02-03 | Discharge: 2019-02-03 | Disposition: A | Discharge status: Home / Self Care | Payer: Medicare Other | Attending: Emergency Medicine | Admitting: Emergency Medicine

## 2019-02-03 DIAGNOSIS — Z87891 Personal history of nicotine dependence: Secondary | ICD-10-CM | POA: Insufficient documentation

## 2019-02-03 DIAGNOSIS — M546 Pain in thoracic spine: Secondary | ICD-10-CM | POA: Diagnosis not present

## 2019-02-03 DIAGNOSIS — E039 Hypothyroidism, unspecified: Secondary | ICD-10-CM | POA: Insufficient documentation

## 2019-02-03 DIAGNOSIS — Z79899 Other long term (current) drug therapy: Secondary | ICD-10-CM | POA: Diagnosis not present

## 2019-02-03 DIAGNOSIS — Y9301 Activity, walking, marching and hiking: Secondary | ICD-10-CM | POA: Insufficient documentation

## 2019-02-03 DIAGNOSIS — Y999 Unspecified external cause status: Secondary | ICD-10-CM | POA: Insufficient documentation

## 2019-02-03 DIAGNOSIS — W19XXXA Unspecified fall, initial encounter: Secondary | ICD-10-CM

## 2019-02-03 DIAGNOSIS — S299XXA Unspecified injury of thorax, initial encounter: Secondary | ICD-10-CM | POA: Diagnosis not present

## 2019-02-03 DIAGNOSIS — Y92018 Other place in single-family (private) house as the place of occurrence of the external cause: Secondary | ICD-10-CM | POA: Insufficient documentation

## 2019-02-03 DIAGNOSIS — M545 Low back pain: Secondary | ICD-10-CM | POA: Diagnosis not present

## 2019-02-03 DIAGNOSIS — W108XXA Fall (on) (from) other stairs and steps, initial encounter: Secondary | ICD-10-CM | POA: Insufficient documentation

## 2019-02-03 DIAGNOSIS — M549 Dorsalgia, unspecified: Secondary | ICD-10-CM

## 2019-02-03 IMAGING — DX DG LUMBAR SPINE COMPLETE 4+V
5 series · 5 of 5 positions shown · non-contrast
Comparison: None.

CLINICAL DATA: Slipped walking down steps today with low back pain.

EXAM:
LUMBAR SPINE - COMPLETE 4+ VIEW

[l-spine ap]
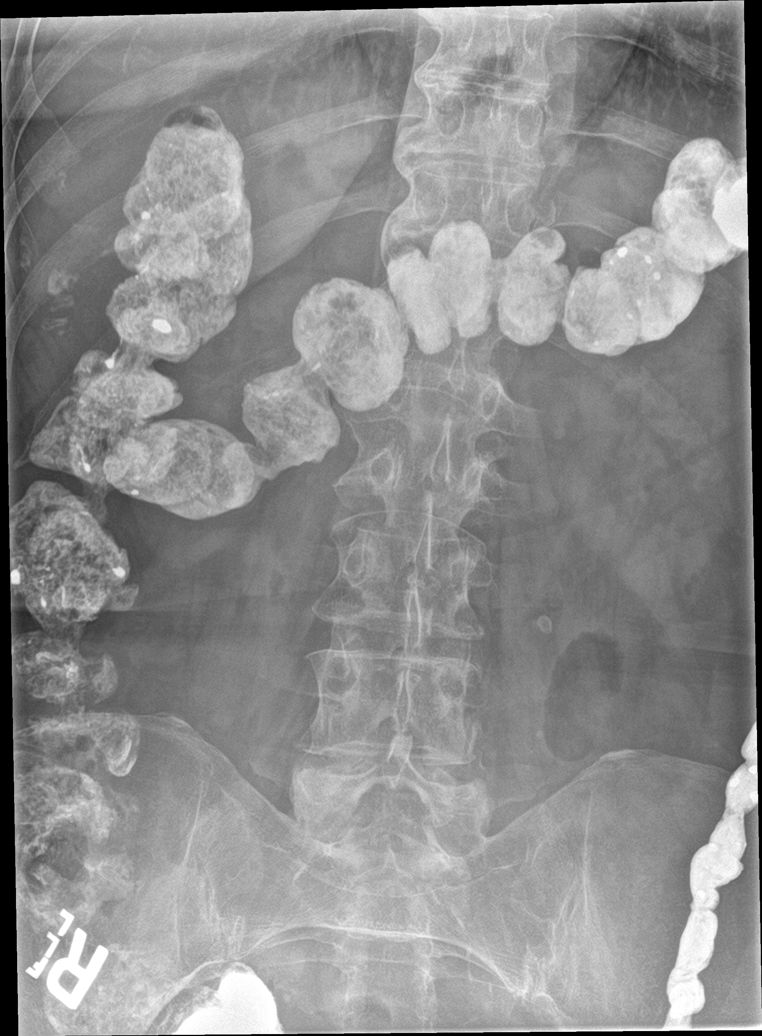

[l-spine obl (1 of 2)]
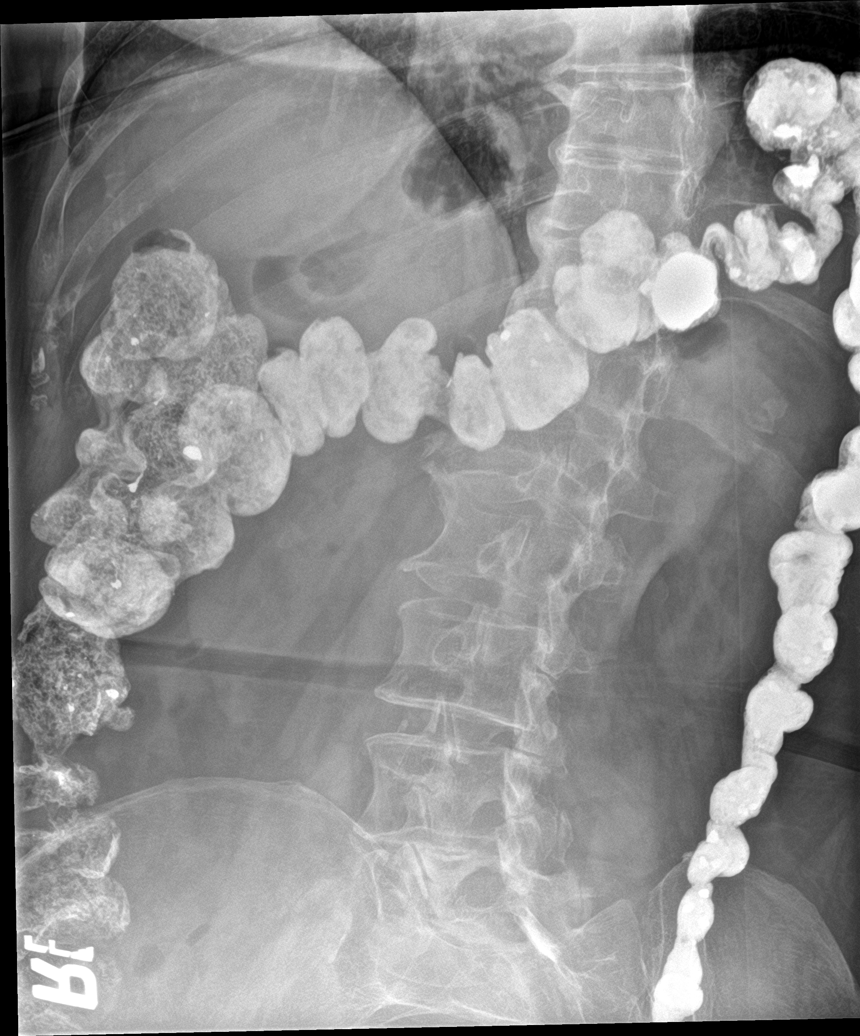

[l-spine obl (2 of 2)]
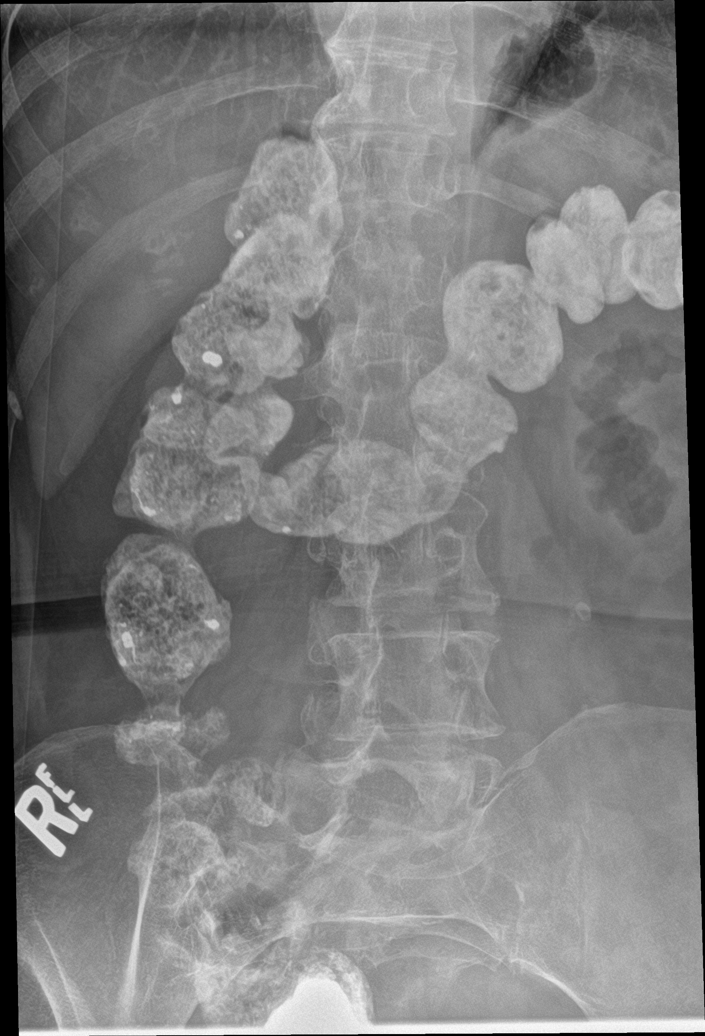

[l-spine lat]
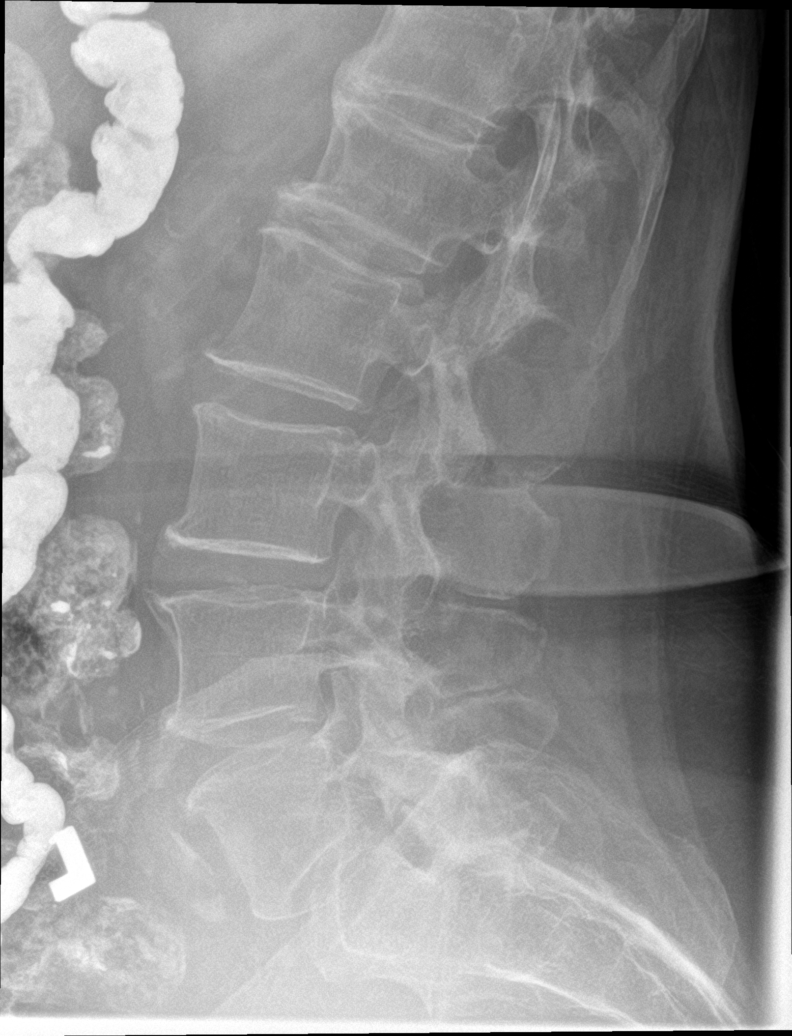

[l-spine spot]
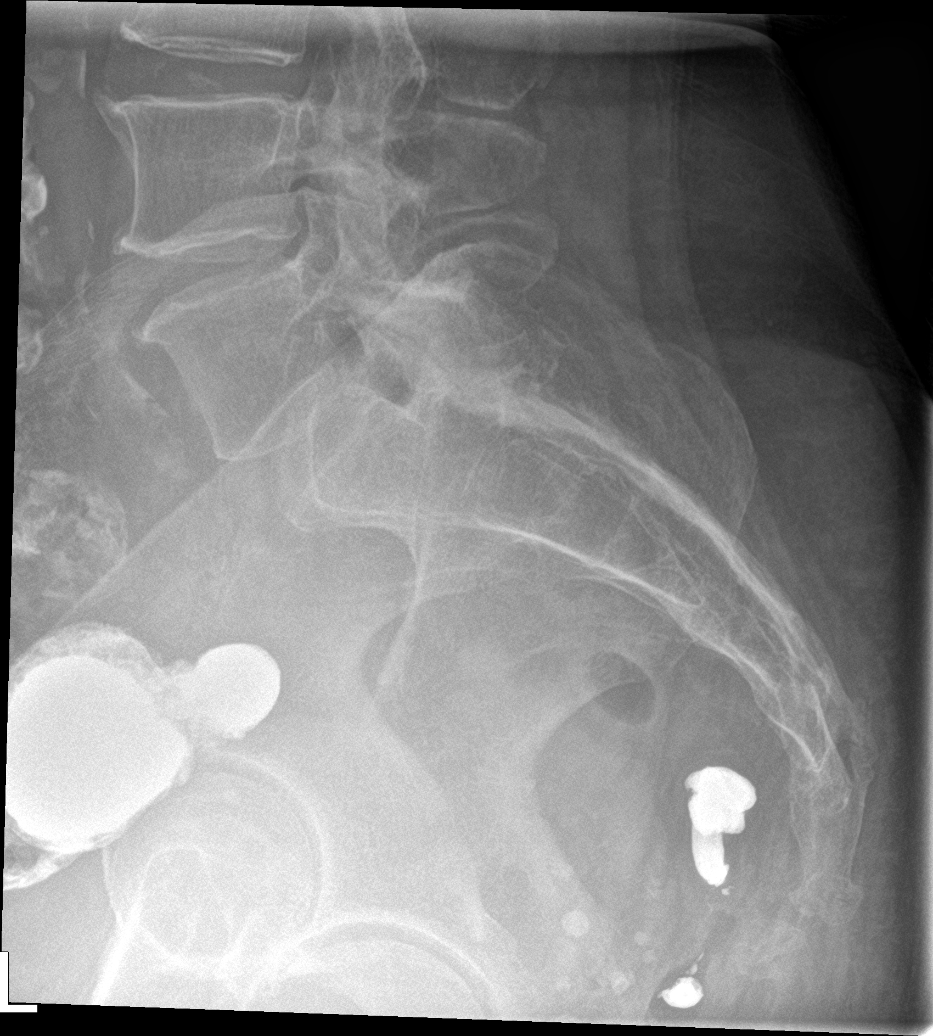

[5 of 5 positions shown; findings below may reference images not displayed]

FINDINGS: Mild osteopenia. Vertebral body alignment, heights and disc space
heights are within normal. There is mild spondylosis throughout the
lumbar spine to include facet arthropathy over the lower lumbar
spine. No evidence of compression fracture or subluxation.
IMPRESSION: No acute findings.

Mild spondylosis of the lumbar spine.

## 2019-02-03 IMAGING — DX DG RIBS W/ CHEST 3+V*L*
6 series · 6 of 6 positions shown · non-contrast
Comparison: None.

CLINICAL DATA: Fell down steps yesterday with left-sided back pain
and thoracic back pain.

EXAM:
LEFT RIBS AND CHEST - 3+ VIEW

[chest pa]
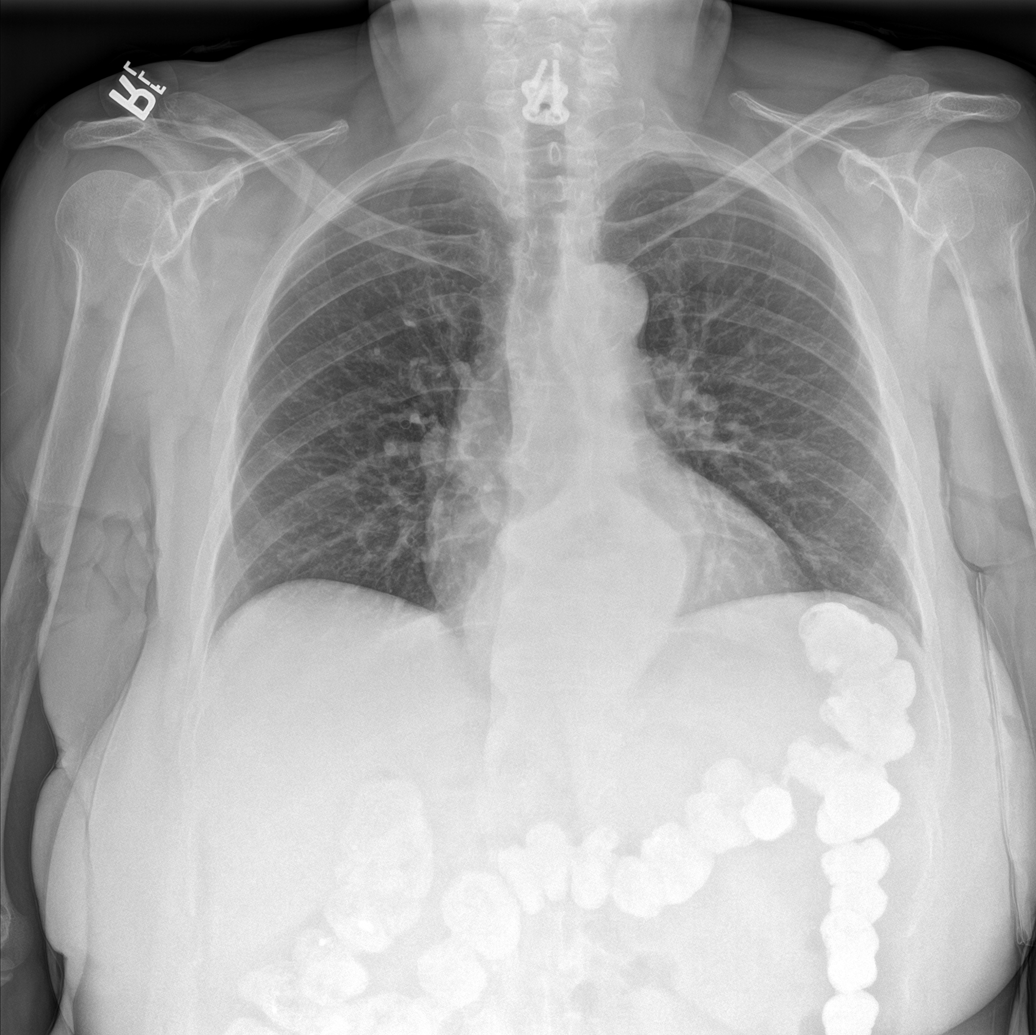

[rib pa obl (1 of 3)]
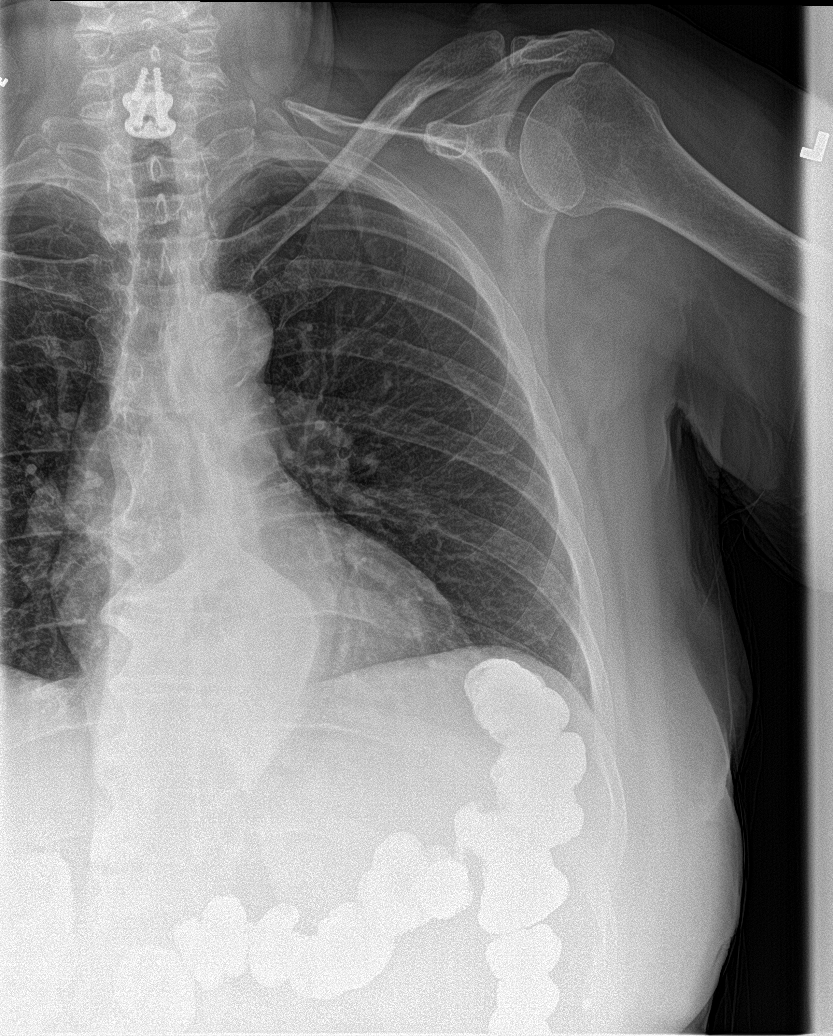

[rib pa obl (2 of 3)]
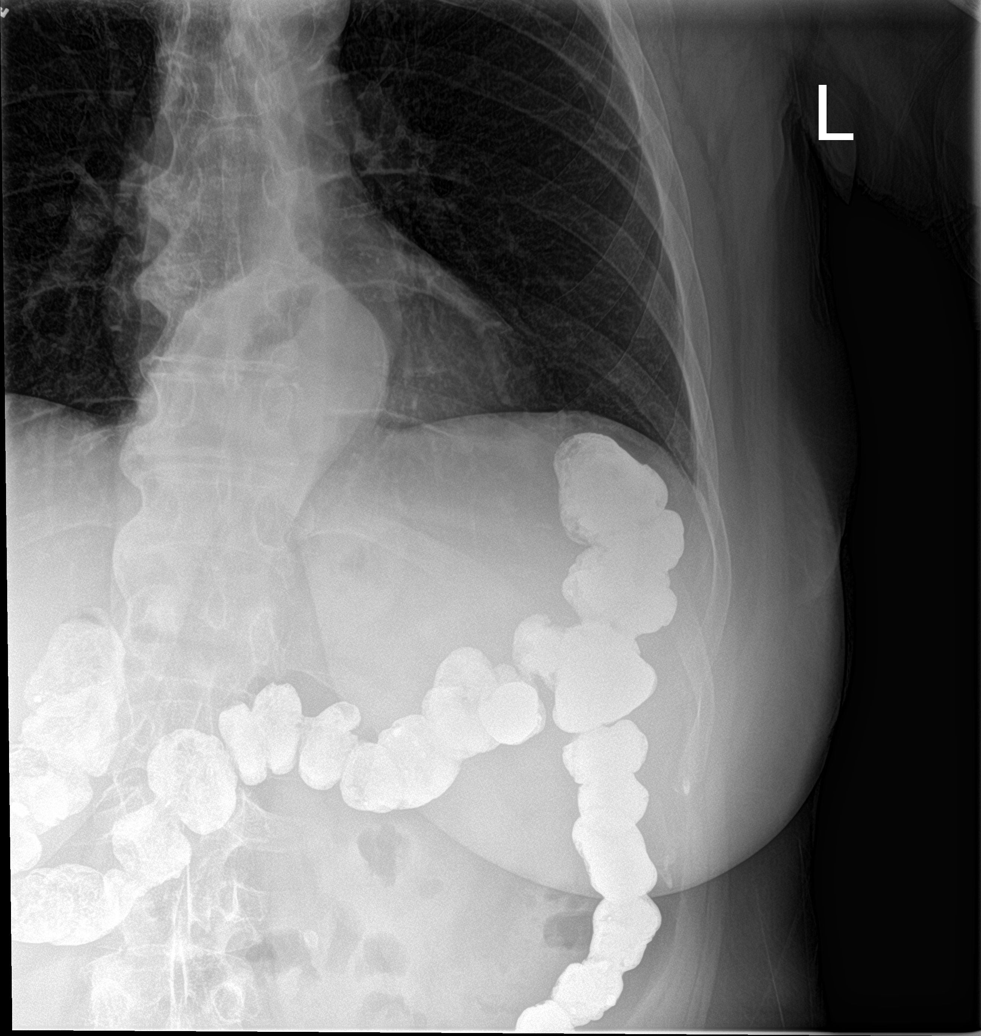

[rib pa]
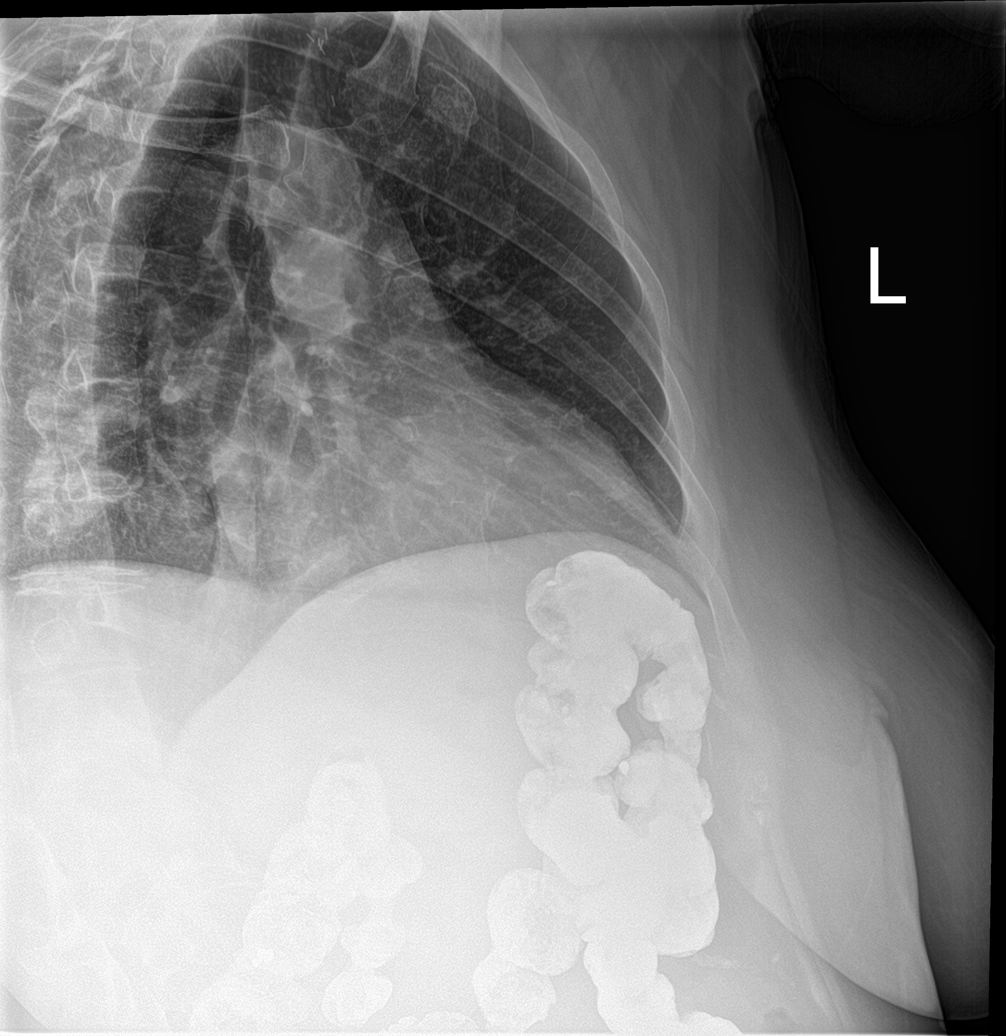

[chest ap]
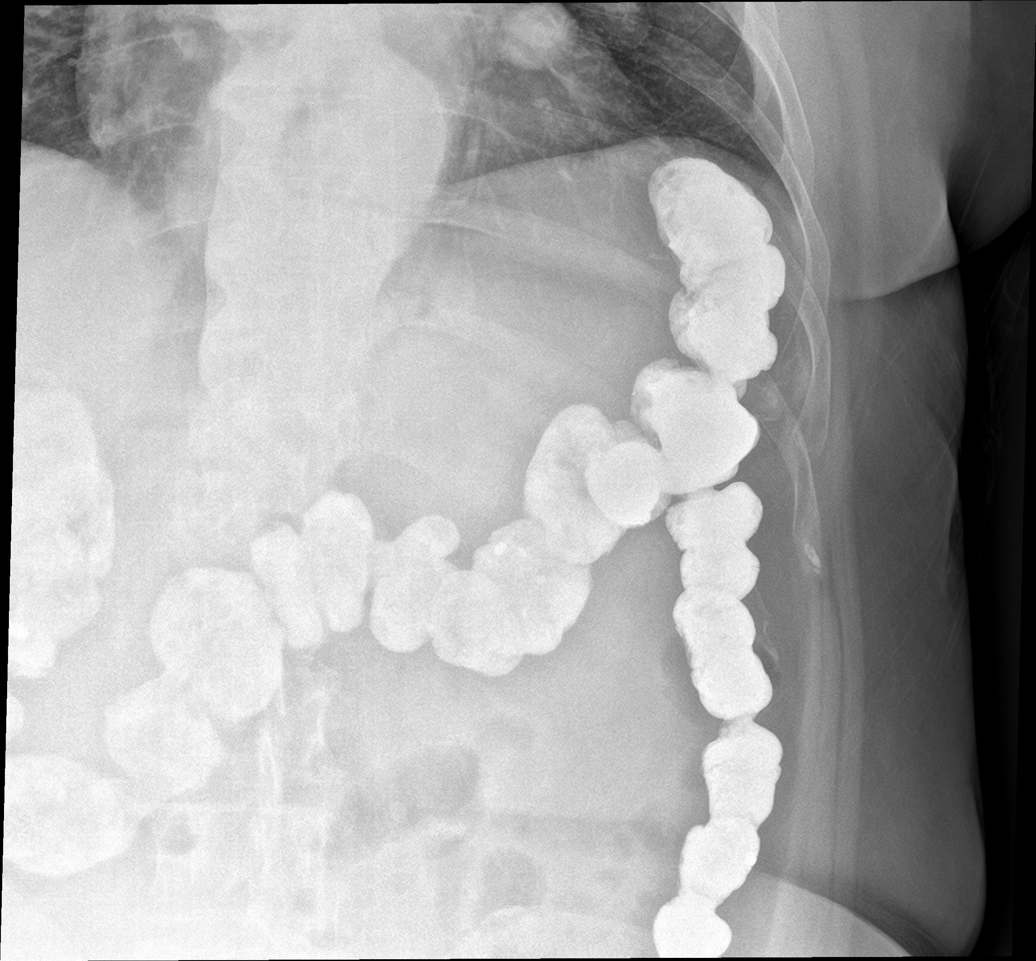

[rib pa obl (3 of 3)]
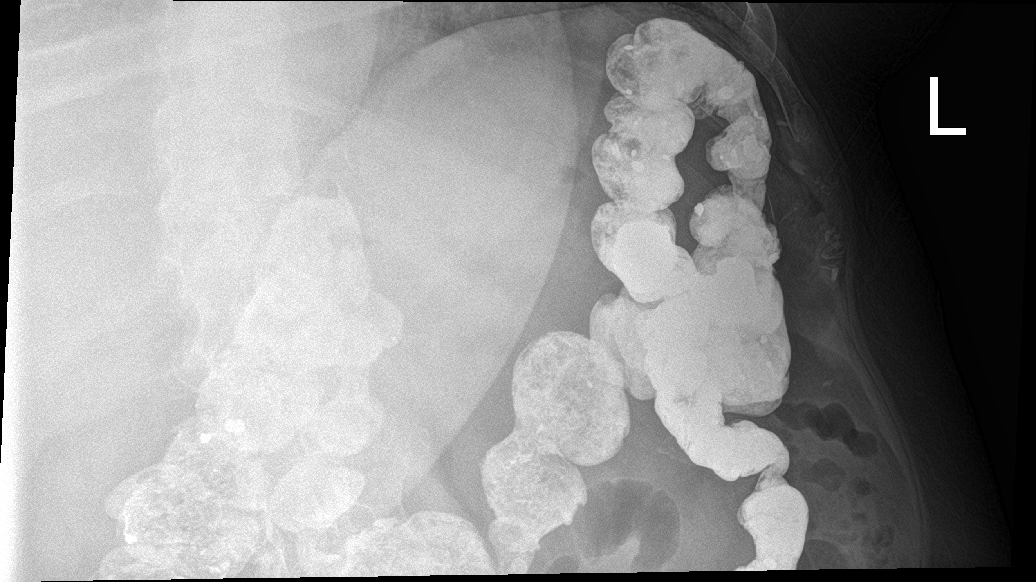

[6 of 6 positions shown; findings below may reference images not displayed]

FINDINGS: Lungs are hypoinflated and otherwise clear. Cardiomediastinal
silhouette is within normal. Evidence of a moderate size hiatal
hernia. No definite left rib fracture. Mild degenerative change of
the spine. Fusion hardware over the cervicothoracic junction.
IMPRESSION: No acute findings.

Moderate size hiatal hernia.

## 2019-02-03 IMAGING — DX DG THORACIC SPINE 2V
2 series · 2 of 2 positions shown · non-contrast
Comparison: [DATE]

CLINICAL DATA: Pain status post fall

EXAM:
THORACIC SPINE 2 VIEWS

[t-spine lat]
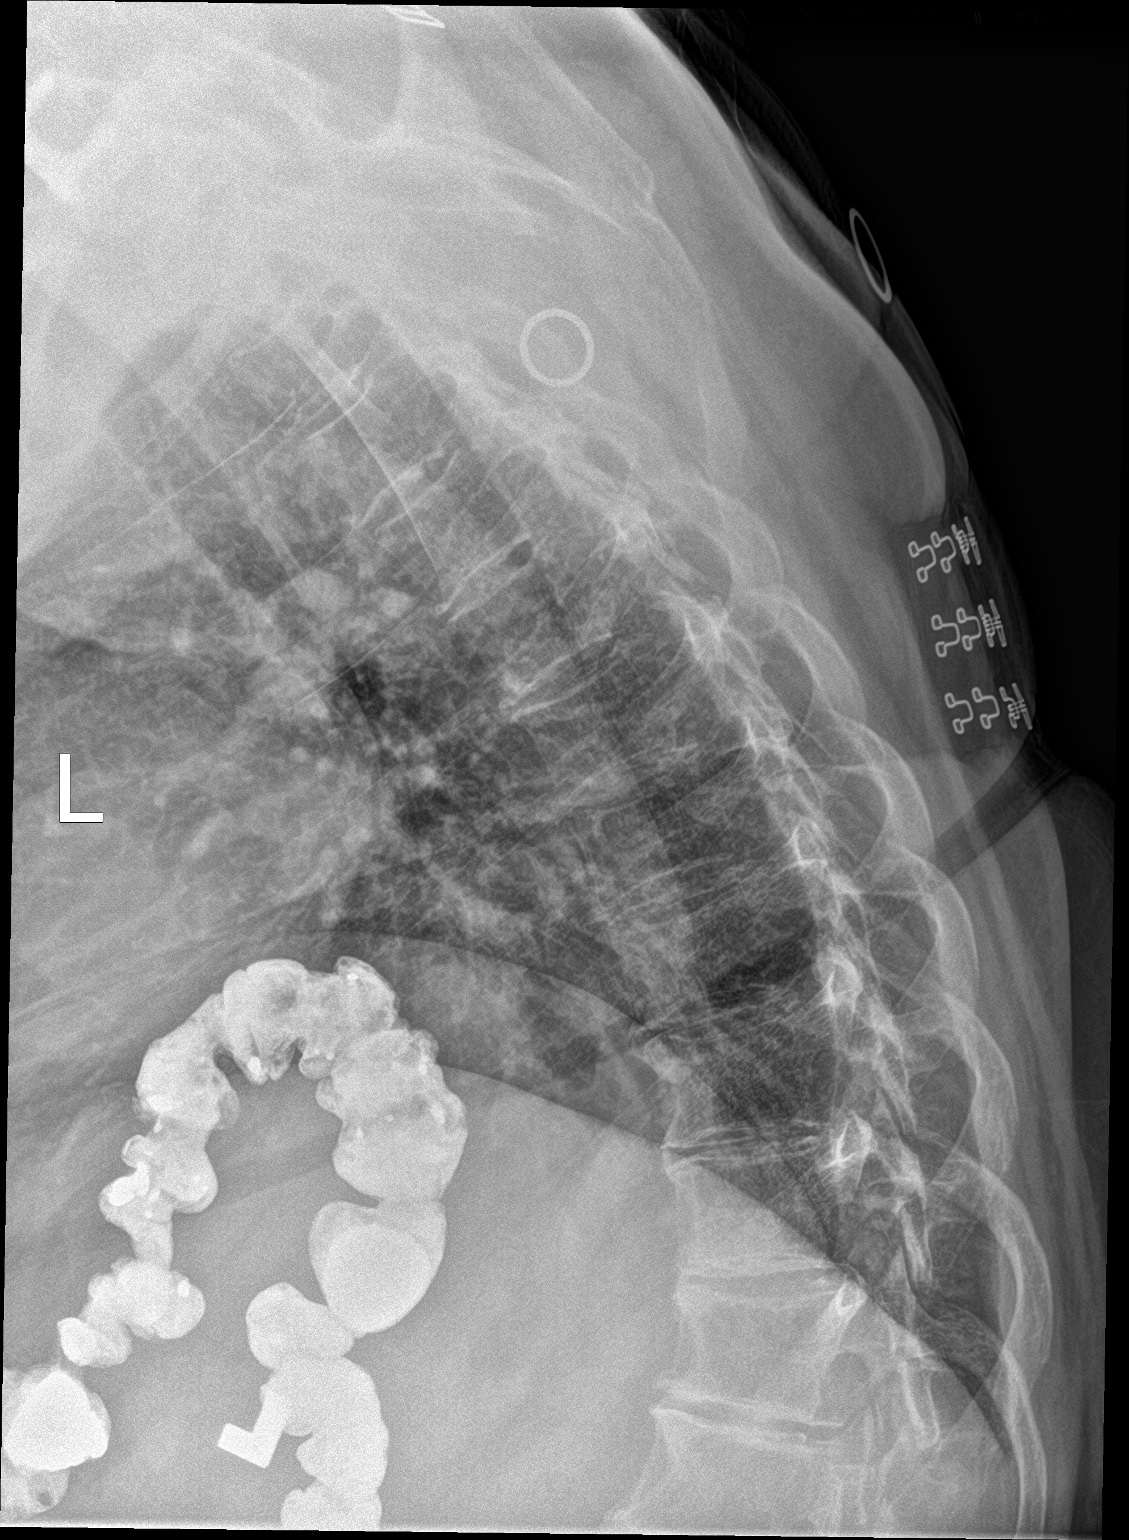

[t-spine swimmers]
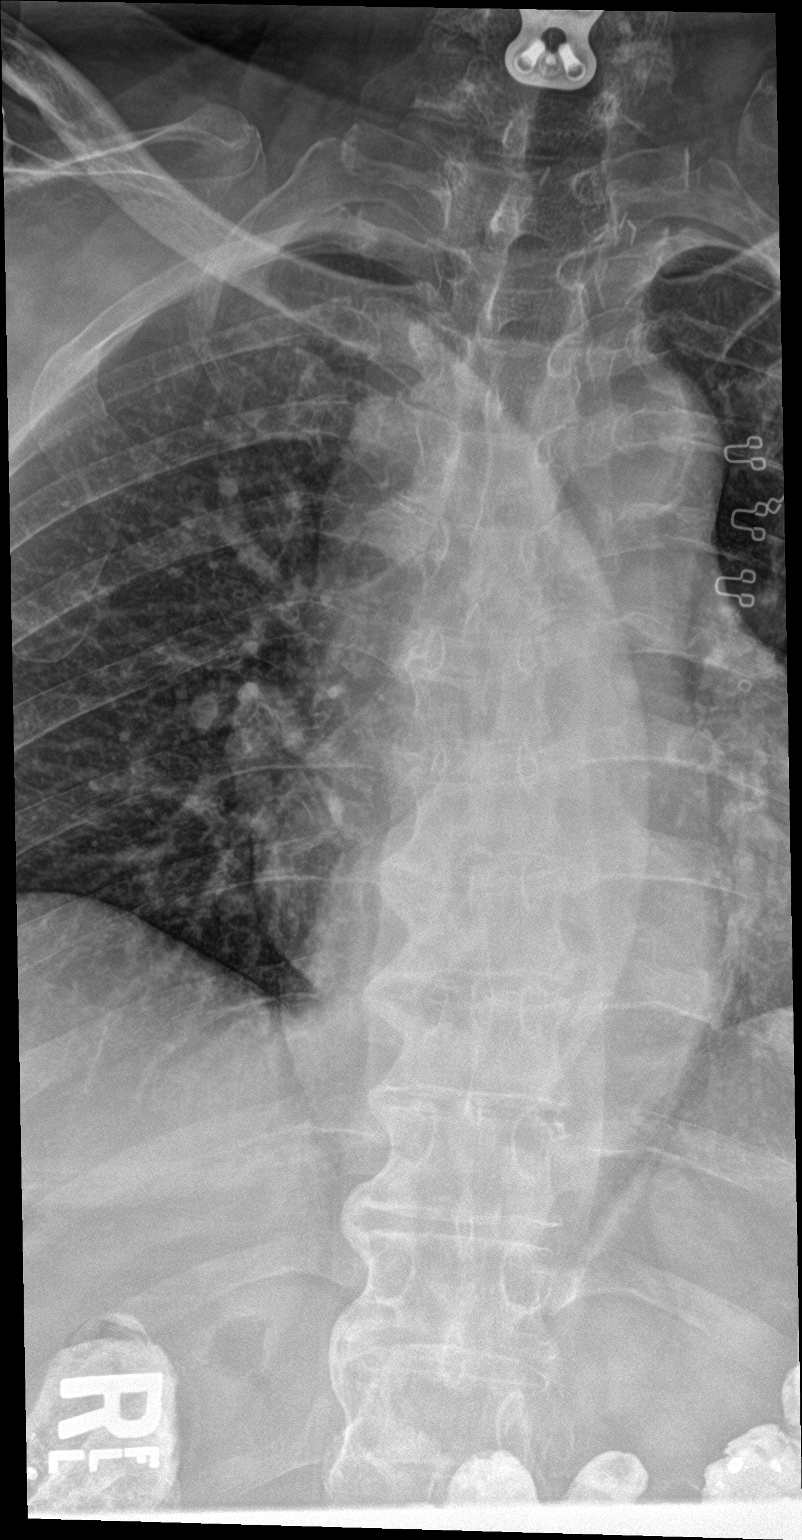

[2 of 2 positions shown; findings below may reference images not displayed]

FINDINGS: There are degenerative changes throughout the thoracic spine without
evidence for an acute fracture.
IMPRESSION: No acute osseous abnormality.

## 2019-02-03 MED ORDER — ACETAMINOPHEN 325 MG PO TABS
650.0000 mg | ORAL_TABLET | Freq: Once | ORAL | Status: AC
  Administered 2019-02-03: 650 mg via ORAL
  Filled 2019-02-03: qty 2

## 2019-02-03 MED ORDER — HYDROCODONE-ACETAMINOPHEN 5-325 MG PO TABS: 1.0000 | ORAL_TABLET | ORAL | 0 refills | Status: DC | PRN

## 2019-02-03 MED ORDER — LIDOCAINE 5 % EX PTCH
1.0000 | MEDICATED_PATCH | CUTANEOUS | Status: DC
  Administered 2019-02-03: 15:00:00 1 via TRANSDERMAL
  Filled 2019-02-03: qty 1

## 2019-02-03 NOTE — ED Notes (Signed)
Pt returned from xray

## 2019-02-03 NOTE — ED Provider Notes (Signed)
[facility] EMERGENCY DEPARTMENT Provider Note   CSN: [id number] Arrival date & time: 02/03/19  1315     History   Chief Complaint Chief Complaint  Patient presents with  . Fall    HPI Latasha Mclaughlin is a 73 y.o. female.     73 year old female with history of hyperparathyroidism, osteoporosis, hyperlipidemia, hypothyroid presents with complaint of back pain after a fall.  Patient states that she stepped off of her porch and fell backwards hitting her left mid back area on the wooden steps behind her.  Patient is not on blood thinners, did not hit her head, no loss of consciousness, denies feeling weak or dizzy prior to the fall.  Patient reports pain in her back and left posterior lower ribs.  No pain in her extremities, has been ambulatory since the fall. No other injuries, complaints, concerns.     Past Medical History:  Diagnosis Date  . Allergy    SEASONAL  . Arthritis   . Back pain   . GERD (gastroesophageal reflux disease)   . History of tonsillectomy   . Hyperlipidemia 12/15/2016  . Hypothyroidism 12/15/2016  . Osteoporosis   . Sinus infection     Patient Active Problem List   Diagnosis Date Noted  . Otalgia of both ears 11/10/2018  . Vitamin B12 deficiency 10/20/2018  . History of colonic polyps 11/18/2017  . Change in bowel habits 11/18/2017  . Abdominal pain, epigastric 11/18/2017  . History of hyperparathyroidism 01/08/2017  . Myalgia 01/05/2017  . Arthralgia 01/05/2017  . Fatigue 01/05/2017  . Paresthesia 01/05/2017  . Overweight (BMI 25.0-29.9) 12/16/2016  . Hypothyroidism 12/15/2016  . Hyperlipidemia 12/15/2016    Past Surgical History:  Procedure Laterality Date  . ADENOIDECTOMY    . ANTERIOR CERVICAL DECOMP/DISCECTOMY FUSION  2002  . COLONOSCOPY  10/06/2018  . DILATION AND CURETTAGE OF UTERUS     X2  . LEFT OOPHORECTOMY    . PARATHYROIDECTOMY    . POLYPECTOMY    . SHOULDER SURGERY    . TONSILLECTOMY       OB History   No obstetric  history on file.      Home Medications    Prior to Admission medications   Medication Sig Start Date End Date Taking? Authorizing Provider  azelastine (ASTELIN) 0.1 % nasal spray Place 2 sprays into both nostrils 2 (two) times daily. Use in each nostril as directed 10/30/18  Yes Morene Rankins, Leona, PA  Cholecalciferol (VITAMIN D) 50 MCG (2000 UT) tablet Take 2,000 Units by mouth daily.   Yes [provider]  Cyanocobalamin (VITAMIN B-12 IJ) Inject as directed every 30 (thirty) days.   Yes [provider]  gabapentin (NEURONTIN) 100 MG capsule TAKE 1 TO 3 CAPSULES BY MOUTH THREE TIMES DAILY AS NEEDED OR AS DIRECTED 01/15/19  Yes Briscoe Deutscher, DO  levothyroxine (SYNTHROID) 100 MCG tablet Take 100 mcg by mouth daily before breakfast.   Yes [provider]  levothyroxine (SYNTHROID) 88 MCG tablet Take by mouth daily. 09/04/18  Yes [provider]  omeprazole (PRILOSEC) 40 MG capsule One daily in the morning before breakfast 01/24/19  Yes Esterwood, Amy S, PA-C  rosuvastatin (CRESTOR) 10 MG tablet Take 10 mg by mouth daily.   Yes [provider]  carboxymethylcellulose 1 % ophthalmic solution Apply 1 drop to eye 3 (three) times daily. Patient not taking: Reported on 02/03/2019 09/11/18   Briscoe Deutscher, DO  HYDROcodone-acetaminophen (NORCO/VICODIN) 5-325 MG tablet Take 1 tablet by mouth every 4 (  four) hours as needed. 02/03/19   Tacy Learn, PA-C    Family History Family History  Problem Relation Age of Onset  . Hyperlipidemia Mother   . Hypertension Mother   . Congestive Heart Failure Mother   . Heart disease Mother   . Cancer Father   . Diabetes Father   . Heart disease Father   . Hyperlipidemia Father   . Colon polyps Father   . Thyroid disease Sister   . Colon polyps Sister   . Drug abuse Brother   . Diabetes Son   . Colon cancer Neg Hx   . Esophageal cancer Neg Hx   . Stomach cancer Neg Hx   . Rectal cancer Neg Hx     Social  History Social History   Tobacco Use  . Smoking status: Former Research scientist (life sciences)  . Smokeless tobacco: Never Used  . Tobacco comment: Quit in 1985  Substance Use Topics  . Alcohol use: No  . Drug use: No     Allergies   Prevacid [lansoprazole] and Doxycycline   Review of Systems Review of Systems  Constitutional: Negative for fever.  Respiratory: Negative for shortness of breath.   Cardiovascular: Negative for chest pain.  Gastrointestinal: Negative for abdominal pain.  Musculoskeletal: Positive for back pain. Negative for arthralgias, gait problem, neck pain and neck stiffness.  Skin: Negative for rash and wound.  Allergic/Immunologic: Negative for immunocompromised state.  Neurological: Negative for dizziness and weakness.  Hematological: Does not bruise/bleed easily.  Psychiatric/Behavioral: Negative for confusion.  All other systems reviewed and are negative.    Physical Exam Updated Vital Signs BP 139/84 (BP Location: Left Arm)   Pulse 85   Temp 97.6 F (36.4 C) (Oral)   Ht 5\' 6"  (1.676 m)   Wt 78.5 kg   SpO2 99%   BMI 27.92 kg/m   Physical Exam Vitals signs and nursing note reviewed.  Constitutional:      General: She is not in acute distress.    Appearance: She is well-developed. She is not diaphoretic.  HENT:     Head: Normocephalic and atraumatic.  Pulmonary:     Effort: Pulmonary effort is normal.  Musculoskeletal:        General: Tenderness present. No swelling or deformity.     Cervical back: Normal. She exhibits normal range of motion, no tenderness and no bony tenderness.     Thoracic back: She exhibits tenderness and bony tenderness.     Lumbar back: She exhibits tenderness and bony tenderness.       Back:  Skin:    General: Skin is warm and dry.     Findings: No erythema or rash.  Neurological:     Mental Status: She is alert and oriented to person, place, and time.  Psychiatric:        Behavior: Behavior normal.      ED Treatments /  Results  Labs (all labs ordered are listed, but only abnormal results are displayed) Labs Reviewed - No data to display  EKG None  Radiology Dg Ribs Unilateral W/chest Left  Result Date: 02/03/2019 CLINICAL DATA:  [place] down steps yesterday with left-sided back pain and thoracic back pain. EXAM: LEFT RIBS AND CHEST - 3+ VIEW COMPARISON:  None. FINDINGS: Lungs are hypoinflated and otherwise clear. Cardiomediastinal silhouette is within normal. Evidence of a moderate size hiatal hernia. No definite left rib fracture. Mild degenerative change of the spine. Fusion hardware over the cervicothoracic junction. IMPRESSION: No acute findings. Moderate size  hiatal hernia. Electronically Signed   By: Marin Olp M.D.   On: 02/03/2019 14:43   Dg Thoracic Spine 2 View  Result Date: 02/03/2019 CLINICAL DATA:  Pain status post fall EXAM: THORACIC SPINE 2 VIEWS COMPARISON:  November 16, 2018 FINDINGS: There are degenerative changes throughout the thoracic spine without evidence for an acute fracture. IMPRESSION: No acute osseous abnormality. Electronically Signed   By: Constance Holster M.D.   On: 02/03/2019 14:43   Dg Lumbar Spine Complete  Result Date: 02/03/2019 CLINICAL DATA:  Slipped walking down steps today with low back pain. EXAM: LUMBAR SPINE - COMPLETE 4+ VIEW COMPARISON:  None. FINDINGS: Mild osteopenia. Vertebral body alignment, heights and disc space heights are within normal. There is mild spondylosis throughout the lumbar spine to include facet arthropathy over the lower lumbar spine. No evidence of compression fracture or subluxation. IMPRESSION: No acute findings. Mild spondylosis of the lumbar spine. Electronically Signed   By: Marin Olp M.D.   On: 02/03/2019 14:40    Procedures Procedures (including critical care time)  Medications Ordered in ED Medications  lidocaine (LIDODERM) 5 % 1 patch (has no administration in time range)  acetaminophen (TYLENOL) tablet 650 mg (has no  administration in time range)     Initial Impression / Assessment and Plan / ED Course  I have reviewed the triage vital signs and the nursing notes.  Pertinent labs & imaging results that were available during my care of the patient were reviewed by me and considered in my medical decision making (see chart for details).  Clinical Course as of Feb 02 1509  Sat Feb 03, 4575  5093 73 year old female with history of osteoporosis, fell down 1 step today and landed on her back and left ribs on the with step behind her.  On exam patient has tenderness down her lower T-spine lumbar spine area as well as left low back and left lower ribs posteriorly.  No overlying abrasions or ecchymosis, no crepitus.  X-ray of the T-spine, L-spine, left ribs negative for fracture. Patient given a Lidoderm patch and Tylenol for her pain. Recommend alternate warm/cold compresses and recheck with PCP. Patient given limited Norco to take if pain is not controlled with above medications. Advised not to drive or operate machinery and recommend taking Colace. Return to ER for new or worsening symptoms.    [LM]    Clinical Course User Index [LM] Tacy Learn, PA-C       Final Clinical Impressions(s) / ED Diagnoses   Final diagnoses:  Fall, initial encounter  Acute left-sided back pain, unspecified back location    ED Discharge Orders         Ordered    HYDROcodone-acetaminophen (NORCO/VICODIN) 5-325 MG tablet  Every 4 hours PRN     02/03/19 1506           Tacy Learn, PA-C 02/03/19 1510    Milton Ferguson, MD 02/04/19 [phone]

## 2019-02-03 NOTE — ED Triage Notes (Signed)
Pt slipped while walking down steps and hit the left side of her back on the steps. Complaining of thoracic back pain to the left side. NAD. Did not hit head and is not on blood thinners.

## 2019-02-03 NOTE — Discharge Instructions (Addendum)
Take your medications as prescribed. Take Tylenol or pain. If pain is not relieved with Tylenol, take Norco in place of your next Tylenol dose. Do not drive or operate machinery if taking Norco. Take Colace if taking Norco to prevent constipation. Follow up with your doctor if pain persists, return to the ER for new or worsening symptoms.

## 2019-02-03 NOTE — ED Notes (Signed)
Patient transported to X-ray 

## 2019-02-05 ENCOUNTER — Ambulatory Visit (HOSPITAL_COMMUNITY): Payer: Medicare Other

## 2019-02-06 ENCOUNTER — Ambulatory Visit (INDEPENDENT_AMBULATORY_CARE_PROVIDER_SITE_OTHER): Payer: Medicare Other | Admitting: Family Medicine

## 2019-02-06 ENCOUNTER — Other Ambulatory Visit: Payer: Self-pay

## 2019-02-06 ENCOUNTER — Encounter: Payer: Self-pay | Admitting: Family Medicine

## 2019-02-06 DIAGNOSIS — M545 Low back pain, unspecified: Secondary | ICD-10-CM

## 2019-02-06 DIAGNOSIS — R10A2 Flank pain, left side: Secondary | ICD-10-CM

## 2019-02-06 DIAGNOSIS — R109 Unspecified abdominal pain: Secondary | ICD-10-CM

## 2019-02-06 MED ORDER — BACLOFEN 10 MG PO TABS: 5.0000 mg | ORAL_TABLET | Freq: Three times a day (TID) | ORAL | 1 refills | Status: DC | PRN

## 2019-02-06 MED ORDER — HYDROCODONE-ACETAMINOPHEN 5-325 MG PO TABS: 1.0000 | ORAL_TABLET | Freq: Four times a day (QID) | ORAL | 0 refills | Status: DC | PRN

## 2019-02-06 NOTE — Progress Notes (Signed)
Office Visit Note   Patient: Latasha Mclaughlin           Date of Birth: 1945/12/28           MRN: [id number] Visit Date: 02/06/2019 Requested by: Briscoe Deutscher, [place] [address] [place],  [place] [id number] PCP: Briscoe Deutscher, DO  Subjective: Chief Complaint  Patient presents with  . Lower Back - Pain    Fell on 02/03/19 - fell backward, hitting back on steps of her porch. Pain mainly on left lower/middle part of back.  . Middle Back - Pain    HPI: She is here with low back and left flank pain.  2 days ago she was walking down a step carrying her dog, she slipped and fell backward hitting the left lower back against the step.  Immediate severe pain, she went to the ER where x-rays were obtained and were negative for obvious fracture.  I reviewed the films myself.  She was given hydrocodone which gives her temporary relief.  She denies any sciatica pain.  She has not had much trouble with her back in the past.  She is scheduled to have CT scan of abdomen and pelvis to evaluate some abdominal troubles but she had to cancel it due to her pain.               ROS: No fevers or chills, no visible blood in the urine.  All other systems were reviewed and are negative.  Objective: Vital Signs: There were no vitals taken for this visit.  Physical Exam:  General:  Alert and oriented, in no acute distress. Pulm:  Breathing unlabored. Psy:  Normal mood, congruent affect. Skin: She has bruising in the left flank area. Low back: She is very tender to palpation near the L1 spinous process and about L3-4.  She is exquisitely tender in the left flank, especially directly over the bruise.  No pelvic tenderness.  She was unable to sit for lower extremity strength and reflexes examination.  Imaging: None today.  Hospital x-rays were reviewed on computer in detail.  L1 looks slightly flatter than I would expect compared to the other vertebra, but it has a similar appearance to April 2020 CT scan  images.  Assessment & Plan: 1.  2 days status post fall with left low back and flank pain, cannot completely rule out occult lumbar fracture or even a left rib cage fracture. -We will try baclofen for muscle spasm, refilled hydrocodone.  She will contact me later this week to let me know how she is doing.  If still in severe pain, we will try to proceed with CT scan of abdomen and pelvis.  Could contemplate lumbar MRI scan depending on those results.     Procedures: No procedures performed  No notes on file     PMFS History: Patient Active Problem List   Diagnosis Date Noted  . Otalgia of both ears 11/10/2018  . Vitamin B12 deficiency 10/20/2018  . History of colonic polyps 11/18/2017  . Change in bowel habits 11/18/2017  . Abdominal pain, epigastric 11/18/2017  . History of hyperparathyroidism 01/08/2017  . Myalgia 01/05/2017  . Arthralgia 01/05/2017  . Fatigue 01/05/2017  . Paresthesia 01/05/2017  . Overweight (BMI 25.0-29.9) 12/16/2016  . Hypothyroidism 12/15/2016  . Hyperlipidemia 12/15/2016   Past Medical History:  Diagnosis Date  . Allergy    SEASONAL  . Arthritis   . Back pain   . GERD (gastroesophageal reflux disease)   .  History of tonsillectomy   . Hyperlipidemia 12/15/2016  . Hypothyroidism 12/15/2016  . Osteoporosis   . Sinus infection     Family History  Problem Relation Age of Onset  . Hyperlipidemia Mother   . Hypertension Mother   . Congestive Heart Failure Mother   . Heart disease Mother   . Cancer Father   . Diabetes Father   . Heart disease Father   . Hyperlipidemia Father   . Colon polyps Father   . Thyroid disease Sister   . Colon polyps Sister   . Drug abuse Brother   . Diabetes Son   . Colon cancer Neg Hx   . Esophageal cancer Neg Hx   . Stomach cancer Neg Hx   . Rectal cancer Neg Hx     Past Surgical History:  Procedure Laterality Date  . ADENOIDECTOMY    . ANTERIOR CERVICAL DECOMP/DISCECTOMY FUSION  2002  . COLONOSCOPY   10/06/2018  . DILATION AND CURETTAGE OF UTERUS     X2  . LEFT OOPHORECTOMY    . PARATHYROIDECTOMY    . POLYPECTOMY    . SHOULDER SURGERY    . TONSILLECTOMY     Social History   Occupational History  . Not on file  Tobacco Use  . Smoking status: Former Research scientist (life sciences)  . Smokeless tobacco: Never Used  . Tobacco comment: Quit in 1985  Substance and Sexual Activity  . Alcohol use: No  . Drug use: No  . Sexual activity: Yes

## 2019-02-08 ENCOUNTER — Encounter: Payer: Self-pay | Admitting: Family Medicine

## 2019-02-08 DIAGNOSIS — M545 Low back pain, unspecified: Secondary | ICD-10-CM

## 2019-02-08 DIAGNOSIS — G8911 Acute pain due to trauma: Secondary | ICD-10-CM

## 2019-02-08 NOTE — Addendum Note (Signed)
Addended by: Hortencia Pilar on: 02/08/2019 12:10 PM   Modules accepted: Orders

## 2019-02-09 NOTE — Addendum Note (Signed)
Addended by: Hortencia Pilar on: 02/09/2019 03:16 PM   Modules accepted: Orders

## 2019-02-13 ENCOUNTER — Other Ambulatory Visit: Payer: Self-pay

## 2019-02-13 ENCOUNTER — Telehealth: Payer: Self-pay | Admitting: Family Medicine

## 2019-02-13 ENCOUNTER — Encounter: Payer: Self-pay | Admitting: Family Medicine

## 2019-02-13 ENCOUNTER — Ambulatory Visit (HOSPITAL_COMMUNITY)
Admission: RE | Admit: 2019-02-13 | Discharge: 2019-02-13 | Disposition: A | Discharge status: Home / Self Care | Payer: Medicare Other | Source: Ambulatory Visit | Attending: Family Medicine | Admitting: Family Medicine

## 2019-02-13 DIAGNOSIS — M545 Low back pain: Secondary | ICD-10-CM | POA: Diagnosis not present

## 2019-02-13 DIAGNOSIS — G8911 Acute pain due to trauma: Secondary | ICD-10-CM | POA: Diagnosis not present

## 2019-02-13 IMAGING — MR MR LUMBAR SPINE W/O CM
4 of 5 series · 13 of 48 positions shown · non-contrast
Comparison: None.

CLINICAL DATA: Low back pain radiating into both legs for 11 days.
No known injury.

EXAM:
MRI LUMBAR SPINE WITHOUT CONTRAST
TECHNIQUE: Multiplanar, multisequence MR imaging of the lumbar spine was
performed. No intravenous contrast was administered.

[Series 3: T2 · sagittal · 4.0mm · 0.39mm/px · 4 of 15 slices shown (1 of 2)]
[im 1/15]
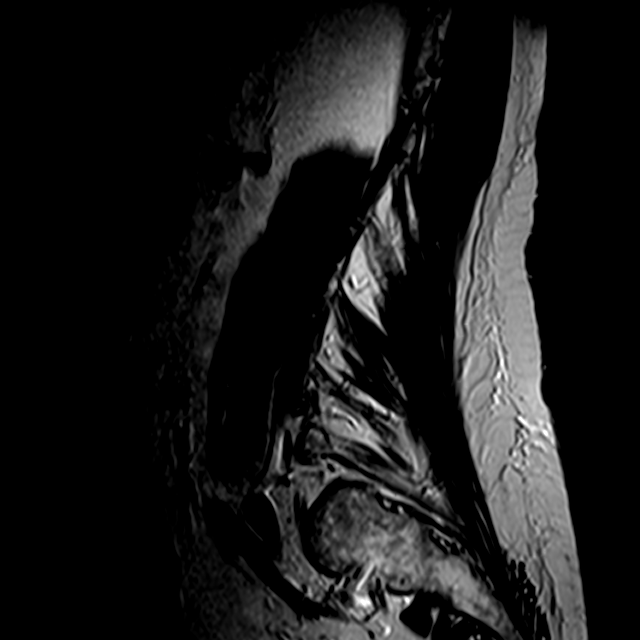
[im 3/15]
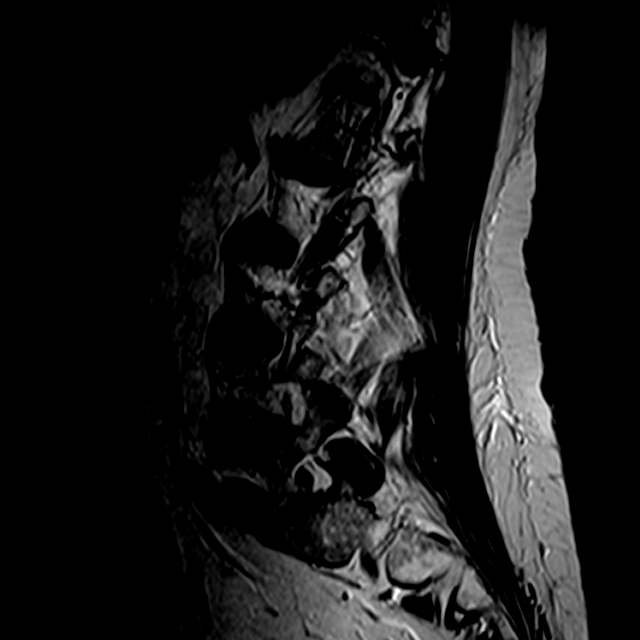
[im 9/15]
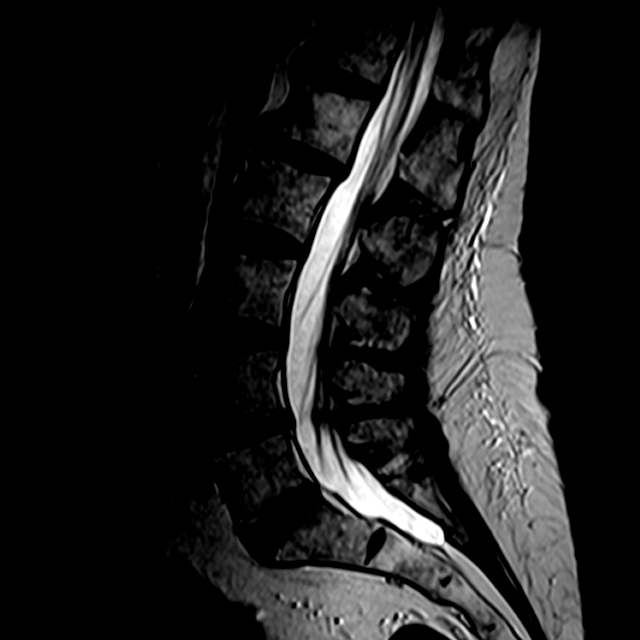
[im 15/15]
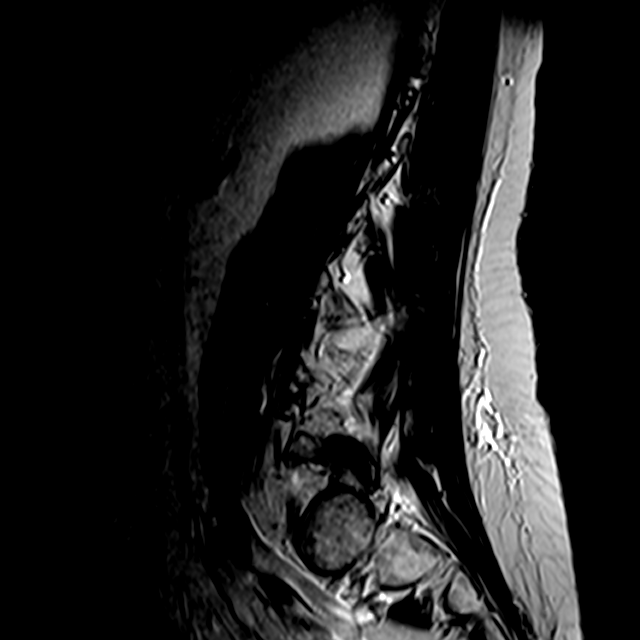

[Series 4: T1 · sagittal · 4.0mm · 0.39mm/px · 3 of 15 slices shown (1 of 2)]
[im 1/15]
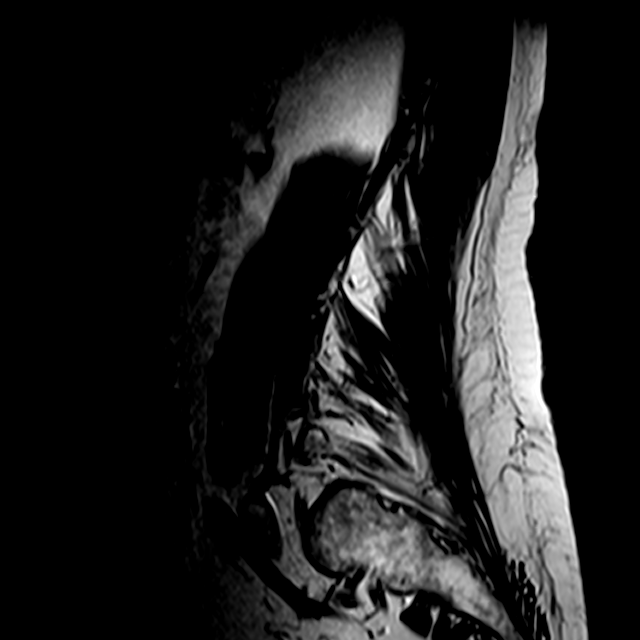
[im 8/15]
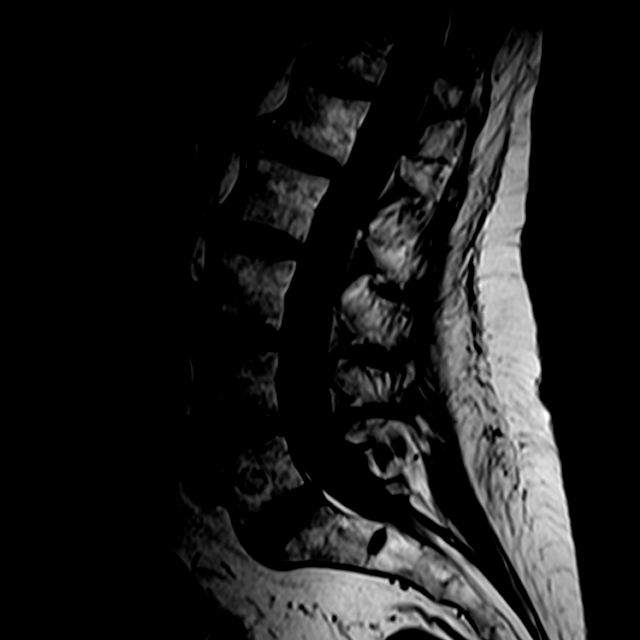
[im 15/15]
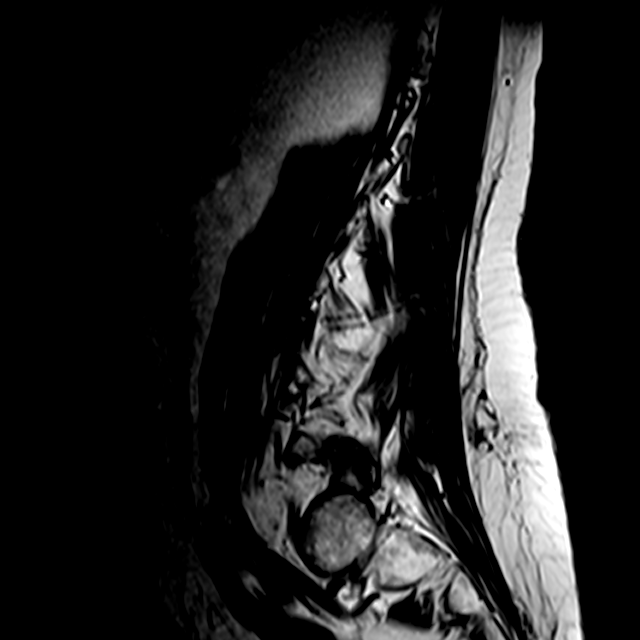

[Series 6: T2 · axial · 4.0mm · 0.26mm/px · z∈[-76,+71]mm · 3 of 42 slices shown (2 of 2)]
[im 6/42]
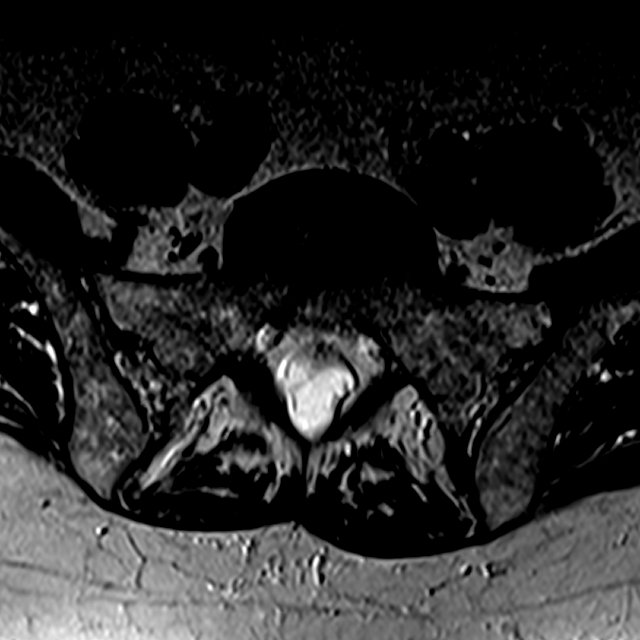
[im 21/42]
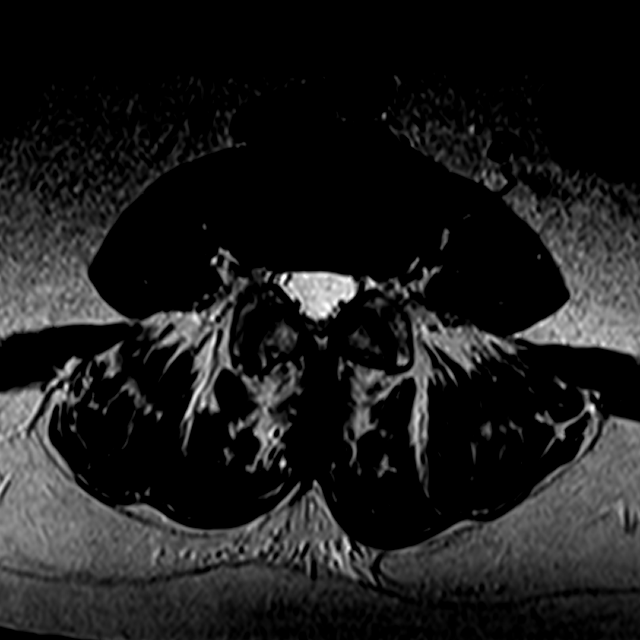
[im 36/42]
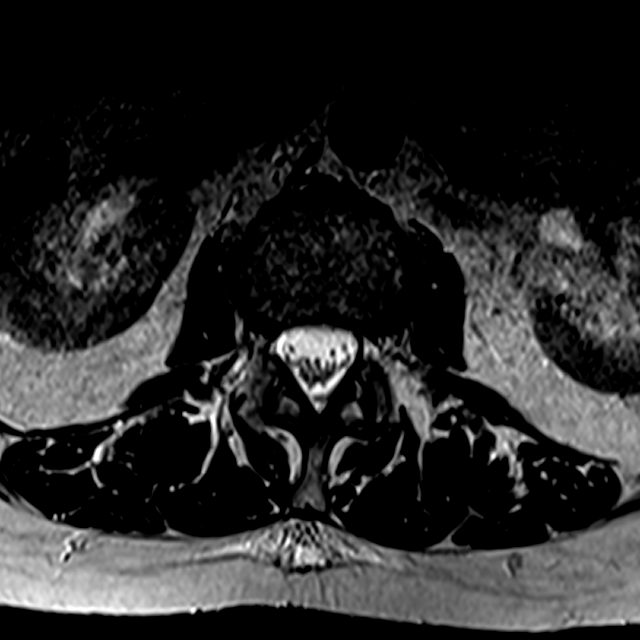

[Series 7: T1 · axial · 4.0mm · 0.26mm/px · z∈[-76,+71]mm · 3 of 42 slices shown (2 of 2)]
[im 6/42]
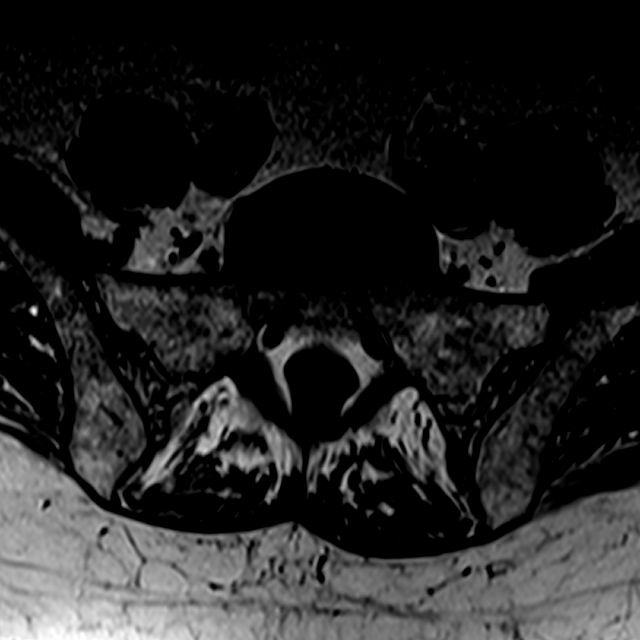
[im 21/42]
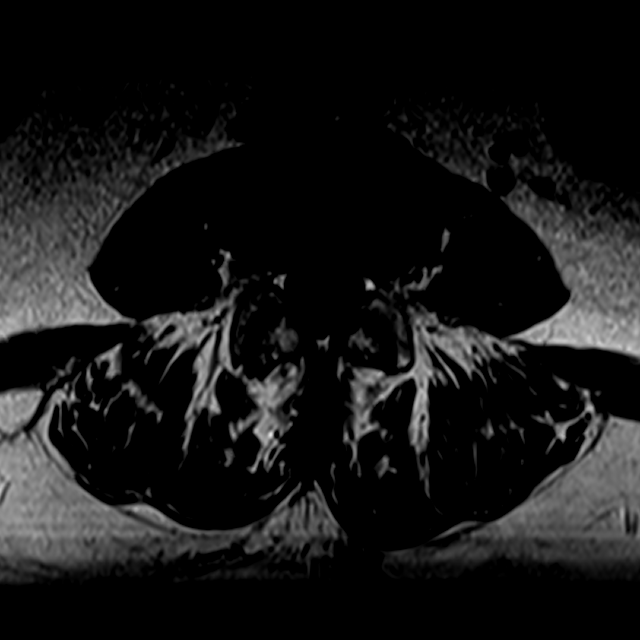
[im 36/42]
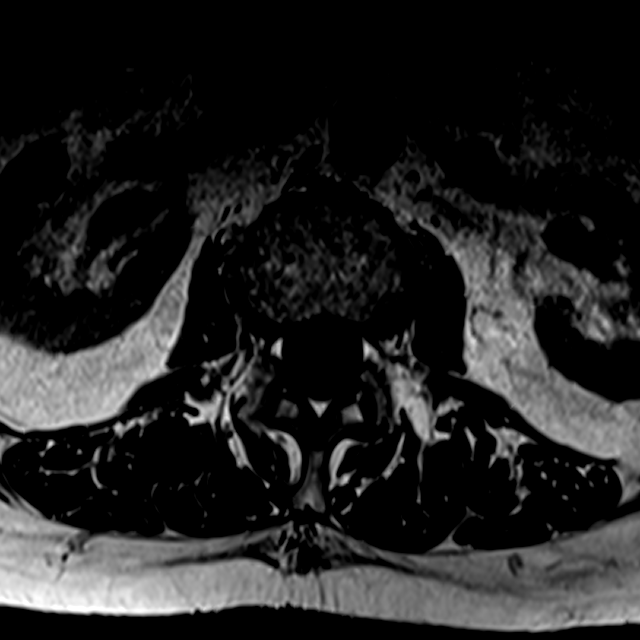

[13 of 48 positions shown; findings below may reference images not displayed]

FINDINGS: Segmentation:  Standard.

Alignment:  Normal.

Vertebrae:  No fracture, evidence of discitis, or bone lesion.

Conus medullaris and cauda equina: Conus extends to the L1 level.
Conus and cauda equina appear normal.

Paraspinal and other soft tissues: Negative.

Disc levels:

T11-12 and T12-L1 are imaged in the sagittal plane only. There is
loss of disc space height and a central disc extrusion with cephalad
extension at T11-12 which effaces the ventral thecal sac but the
central canal appears open. T12-L1 is negative.

L1-2: Negative.

L2-3: Negative.

L3-4: Negative.

L4-5: Shallow disc bulge and mild-to-moderate facet degenerative
disease. No stenosis.

L5-S1: Moderate facet arthropathy and a minimal disc bulge. No
stenosis.
IMPRESSION: Central disc extrusion with cephalad extension at T11-12 is seen in
the sagittal plane only. The disc appears to efface the ventral
thecal sac although the central canal is open.

Shallow disc bulge L4-5 without central canal or foraminal stenosis.

## 2019-02-13 MED ORDER — DIAZEPAM 5 MG PO TABS: ORAL_TABLET | ORAL | 0 refills | Status: DC

## 2019-02-13 NOTE — Telephone Encounter (Signed)
MRI does not show any fractures.  The disc between T11-12 appears to be protruding, but this is not in the region of your pain.

## 2019-02-13 NOTE — Addendum Note (Signed)
Addended by: Hortencia Pilar on: 02/13/2019 12:07 PM   Modules accepted: Orders

## 2019-02-14 ENCOUNTER — Ambulatory Visit (HOSPITAL_COMMUNITY): Payer: Medicare Other

## 2019-02-14 ENCOUNTER — Telehealth: Payer: Self-pay | Admitting: Physical Therapy

## 2019-02-14 NOTE — Telephone Encounter (Signed)
Copied from [place] [phone]. Topic: General - Other >> Feb 14, 2019  2:20 PM Sheran Luz wrote: Patient calling to inquire if Dr. [name] has had a change to read Mychart message sent yesterday.

## 2019-02-14 NOTE — Telephone Encounter (Signed)
My chart sent to Dr. [name] have sent message to let pt know.

## 2019-02-15 ENCOUNTER — Ambulatory Visit (INDEPENDENT_AMBULATORY_CARE_PROVIDER_SITE_OTHER): Payer: Medicare Other

## 2019-02-15 ENCOUNTER — Other Ambulatory Visit: Payer: Self-pay

## 2019-02-15 DIAGNOSIS — E538 Deficiency of other specified B group vitamins: Secondary | ICD-10-CM

## 2019-02-15 MED ORDER — FIRST-DUKES MOUTHWASH MT SUSP: OROMUCOSAL | 1 refills | Status: DC

## 2019-02-15 MED ORDER — CYANOCOBALAMIN 1000 MCG/ML IJ SOLN: 1000.0000 ug | Freq: Once | INTRAMUSCULAR | Status: DC

## 2019-02-15 MED ORDER — CYANOCOBALAMIN 1000 MCG/ML IJ SOLN
1000.0000 ug | Freq: Once | INTRAMUSCULAR | Status: AC
  Administered 2019-02-15: 1000 ug via INTRAMUSCULAR

## 2019-02-15 NOTE — Telephone Encounter (Signed)
Okay for [employer] if that is what helped previously. Nystatin (tee'd up) is only a part of it. If [facility]'s MM was not given, okay to do the Nystatin.

## 2019-02-15 NOTE — Addendum Note (Signed)
Addended by: Briscoe Deutscher R on: 02/15/2019 04:22 PM   Modules accepted: Orders

## 2019-02-15 NOTE — Telephone Encounter (Signed)
Pt calling back to follow up on Rx, not sent in yet.

## 2019-02-15 NOTE — Progress Notes (Signed)
Per orders of Dr. Jerline Pain, injection of B-12 given by Francella Solian in left deltoid. Patient tolerated injection well. Patient will make appointment for 61month

## 2019-02-15 NOTE — Telephone Encounter (Signed)
Will send in script for [facility] magic mouth wash. Patient has app for B-12 today will let her know

## 2019-02-23 ENCOUNTER — Other Ambulatory Visit: Payer: Self-pay

## 2019-02-23 ENCOUNTER — Ambulatory Visit (HOSPITAL_COMMUNITY)
Admission: RE | Admit: 2019-02-23 | Discharge: 2019-02-23 | Disposition: A | Discharge status: Home / Self Care | Payer: Medicare Other | Source: Ambulatory Visit | Attending: Physician Assistant | Admitting: Physician Assistant

## 2019-02-23 DIAGNOSIS — R109 Unspecified abdominal pain: Secondary | ICD-10-CM | POA: Diagnosis not present

## 2019-02-23 DIAGNOSIS — K449 Diaphragmatic hernia without obstruction or gangrene: Secondary | ICD-10-CM | POA: Insufficient documentation

## 2019-02-23 IMAGING — CT CT ABD-PELV W/ CM
3 of 5 series · 17 of 46 positions shown, 19 images · IV contrast (omnipaque)
Comparison: [DATE]

CLINICAL DATA: Left lower quadrant pain for 6 months. Hiatal
hernia.

EXAM:
CT ABDOMEN AND PELVIS WITH CONTRAST
TECHNIQUE: Multidetector CT imaging of the abdomen and pelvis was performed
using the standard protocol following bolus administration of
intravenous contrast.
CONTRAST:  100mL OMNIPAQUE IOHEXOL 300 MG/ML  SOLN

[Series 2: axial st · axial · 0.88mm/px · z∈[+907,+1337]mm · 12 of 102 slices shown, 14 images]
[im 8/102  soft-tissue]
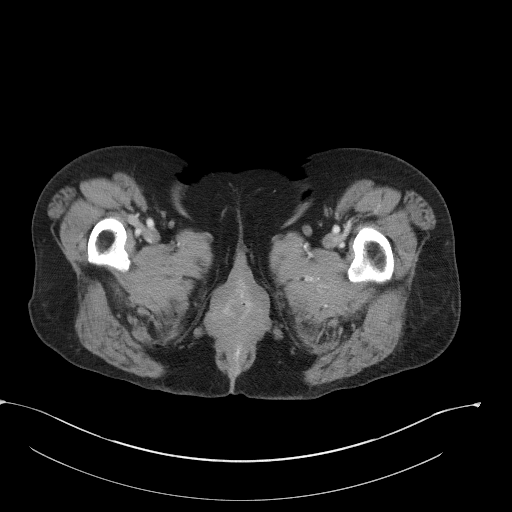
[im 8/102  bone]
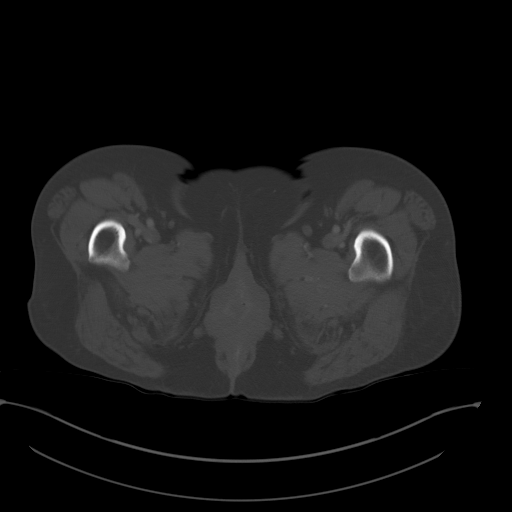
[im 16/102  soft-tissue]
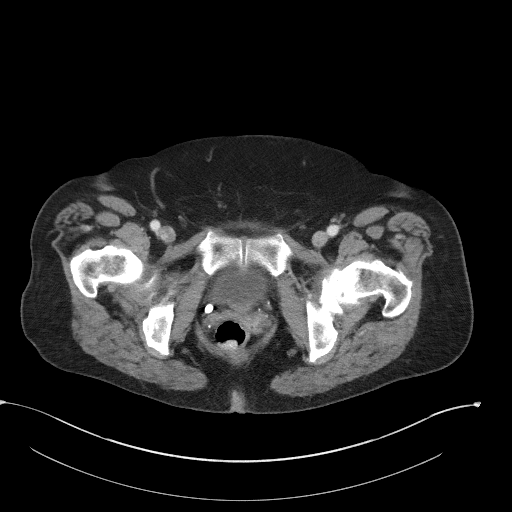
[im 24/102  soft-tissue]
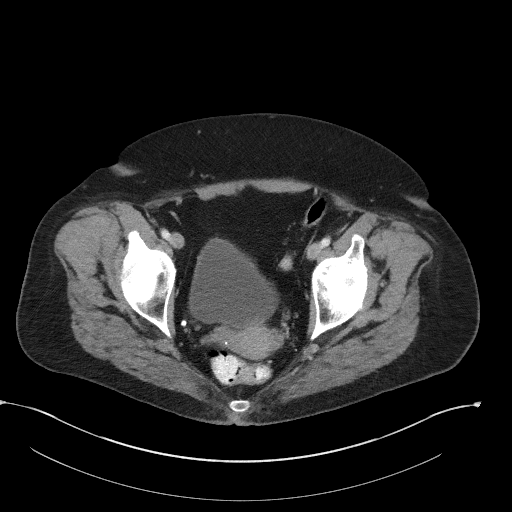
[im 32/102  soft-tissue]
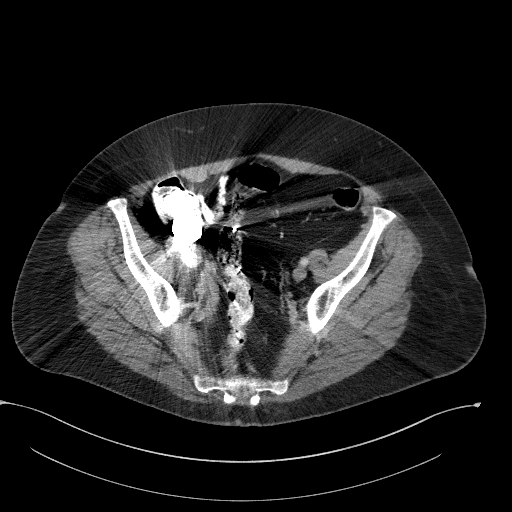
[im 39/102  soft-tissue]
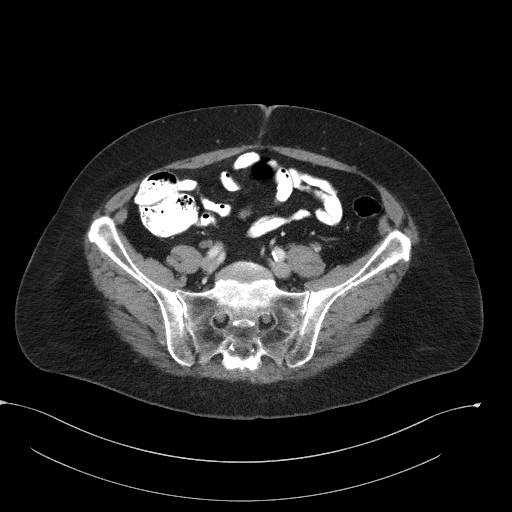
[im 47/102  soft-tissue]
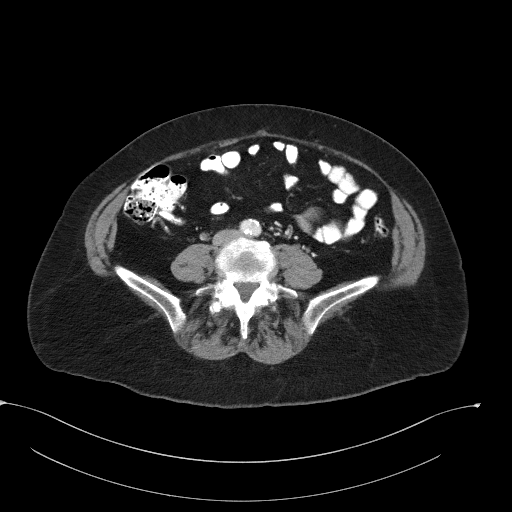
[im 55/102  soft-tissue]
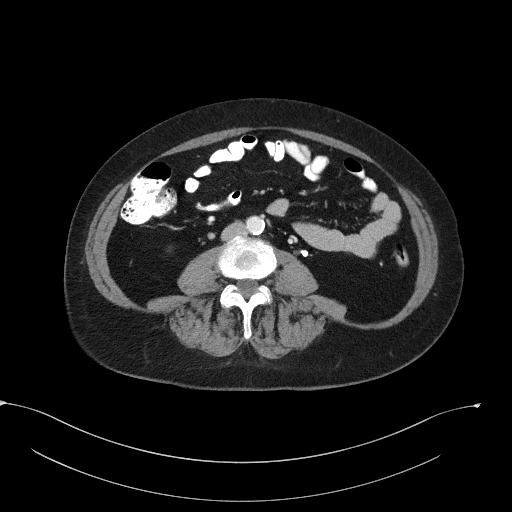
[im 63/102  soft-tissue]
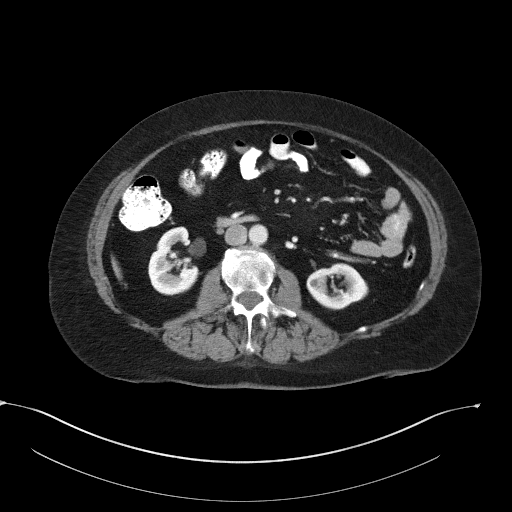
[im 70/102  soft-tissue]
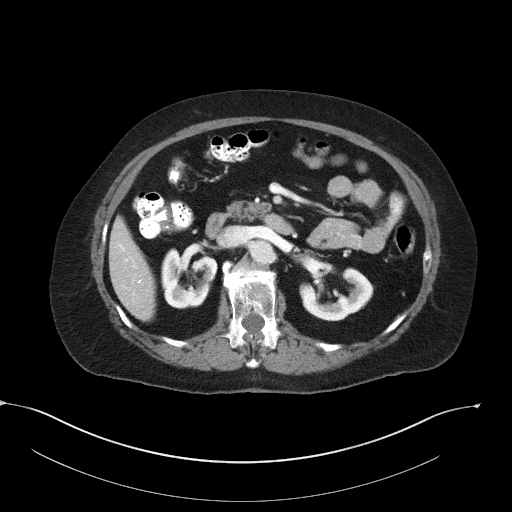
[im 70/102  bone]
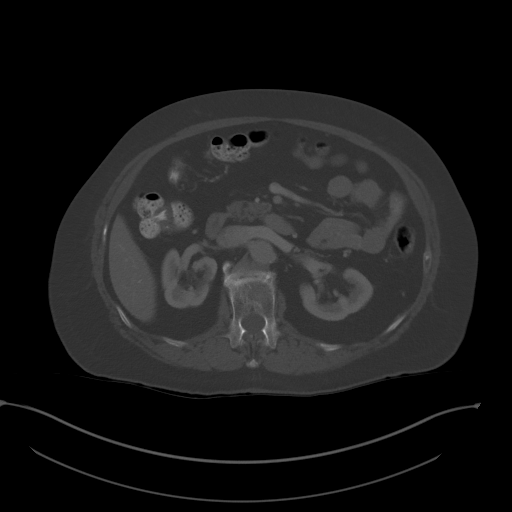
[im 78/102  soft-tissue]
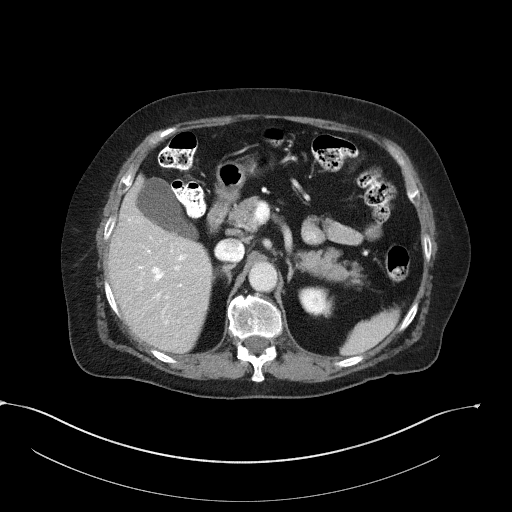
[im 86/102  soft-tissue]
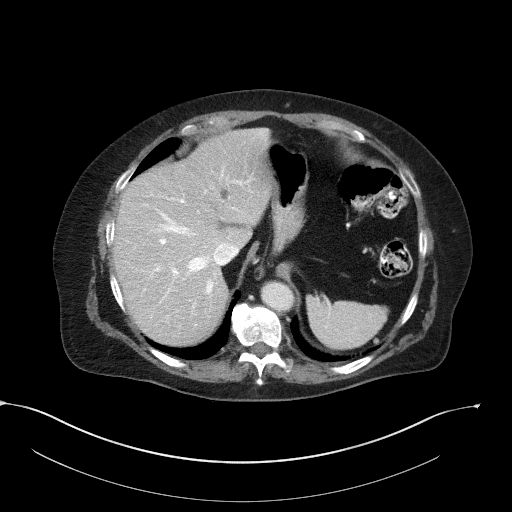
[im 94/102  soft-tissue]
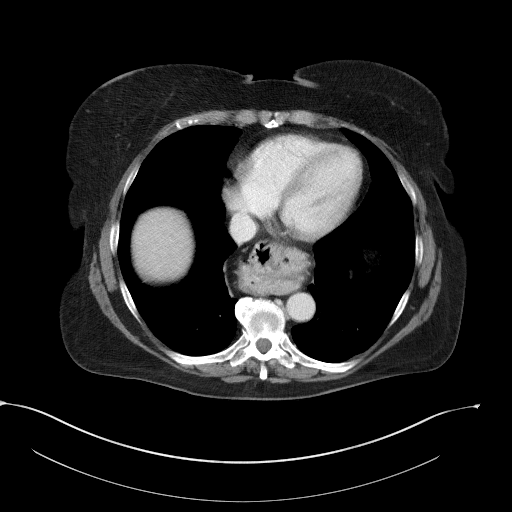

[Series 5: lung bases · axial · 0.88mm/px · z∈[+1211,+1227]mm · 2 of 92 slices shown]
[im 9/92  bone]
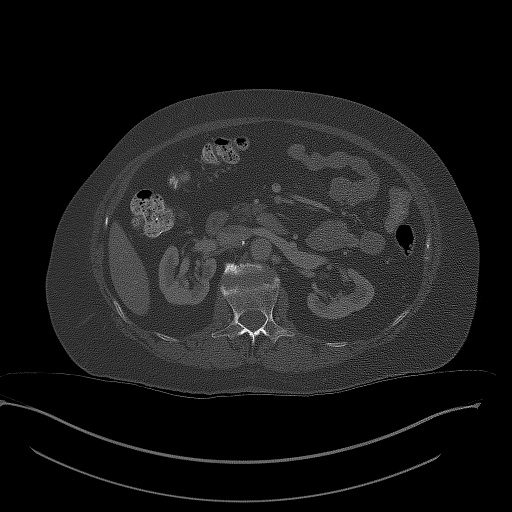
[im 17/92  bone]
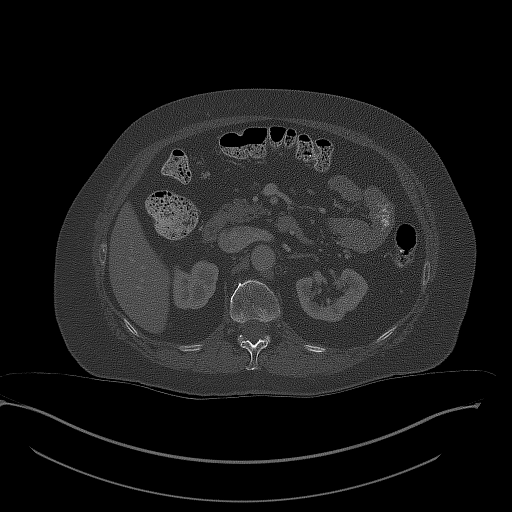

[Series 6: coronal st · coronal · 0.85mm/px · 3 of 109 slices shown]
[im 37/109  soft-tissue]
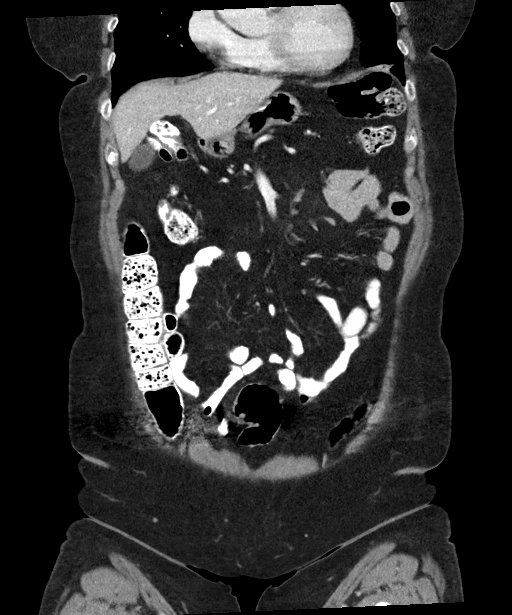
[im 49/109  soft-tissue]
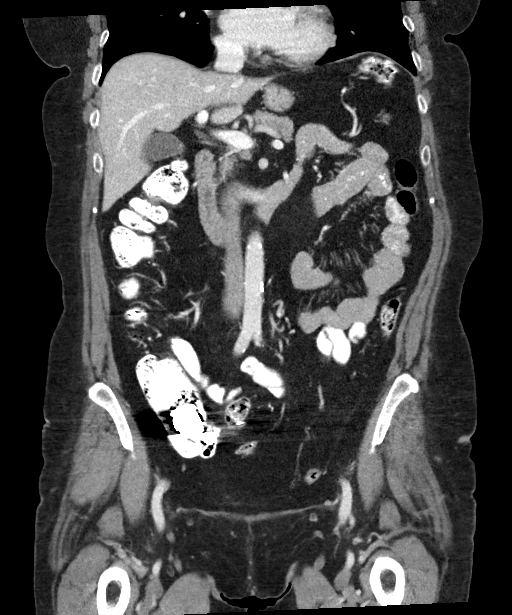
[im 61/109  soft-tissue]
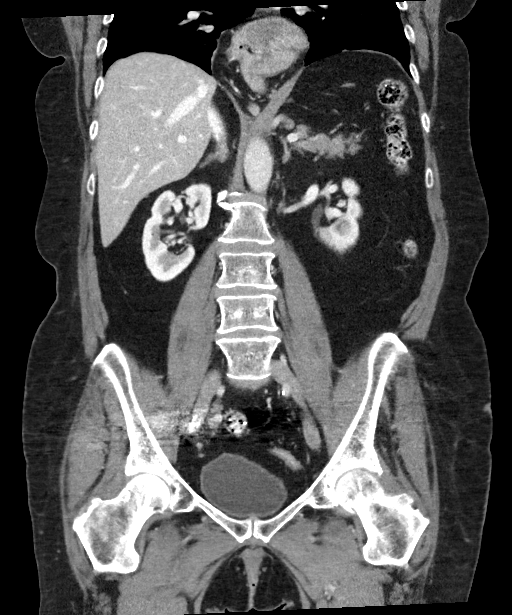

[17 of 46 positions shown; findings below may reference images not displayed]

FINDINGS: Lower Chest: No acute findings.

Hepatobiliary: Stable small cyst in lateral segment of left lobe.
Gallbladder is unremarkable. No evidence of biliary ductal
dilatation. No hepatic masses identified.

Pancreas:  No mass or inflammatory changes.

Spleen: Within normal limits in size and appearance.

Adrenals/Urinary Tract: No masses identified. Stable tiny sub-cm
left renal cyst again noted. No evidence of hydronephrosis.

Stomach/Bowel: Moderate size hiatal hernia again seen. No evidence
of obstruction, inflammatory process or abnormal fluid collections.

Vascular/Lymphatic: No pathologically enlarged lymph nodes. No
abdominal aortic aneurysm. Aortic atherosclerosis.

Reproductive:  No mass or other significant abnormality.

Other:  None.

Musculoskeletal:  No suspicious bone lesions identified.
IMPRESSION: No acute findings within the abdomen or pelvis.

Stable moderate hiatal hernia.

Aortic Atherosclerosis ([HH]-[HH]).

## 2019-02-23 MED ORDER — IOHEXOL 300 MG/ML  SOLN
100.0000 mL | Freq: Once | INTRAMUSCULAR | Status: AC | PRN
  Administered 2019-02-23: 09:00:00 100 mL via INTRAVENOUS

## 2019-02-27 NOTE — Progress Notes (Signed)
Reviewed and agree. Delorean Knutzen, DO  

## 2019-03-15 ENCOUNTER — Other Ambulatory Visit: Payer: Self-pay

## 2019-03-16 ENCOUNTER — Encounter: Payer: Self-pay | Admitting: Family Medicine

## 2019-03-16 ENCOUNTER — Ambulatory Visit (INDEPENDENT_AMBULATORY_CARE_PROVIDER_SITE_OTHER): Payer: Medicare Other

## 2019-03-16 ENCOUNTER — Other Ambulatory Visit: Payer: Self-pay

## 2019-03-16 ENCOUNTER — Ambulatory Visit (INDEPENDENT_AMBULATORY_CARE_PROVIDER_SITE_OTHER): Payer: Medicare Other | Admitting: Family Medicine

## 2019-03-16 VITALS — BP 148/82 | HR 62 | Temp 97.4°F | Ht 66.0 in | Wt 177.0 lb

## 2019-03-16 VITALS — BP 148/82 | Temp 97.4°F | Ht 66.0 in | Wt 177.0 lb

## 2019-03-16 DIAGNOSIS — E2839 Other primary ovarian failure: Secondary | ICD-10-CM

## 2019-03-16 DIAGNOSIS — E538 Deficiency of other specified B group vitamins: Secondary | ICD-10-CM

## 2019-03-16 DIAGNOSIS — E782 Mixed hyperlipidemia: Secondary | ICD-10-CM

## 2019-03-16 DIAGNOSIS — Z23 Encounter for immunization: Secondary | ICD-10-CM

## 2019-03-16 DIAGNOSIS — Z0000 Encounter for general adult medical examination without abnormal findings: Secondary | ICD-10-CM | POA: Diagnosis not present

## 2019-03-16 DIAGNOSIS — K219 Gastro-esophageal reflux disease without esophagitis: Secondary | ICD-10-CM | POA: Insufficient documentation

## 2019-03-16 DIAGNOSIS — R202 Paresthesia of skin: Secondary | ICD-10-CM | POA: Diagnosis not present

## 2019-03-16 DIAGNOSIS — Z1231 Encounter for screening mammogram for malignant neoplasm of breast: Secondary | ICD-10-CM | POA: Diagnosis not present

## 2019-03-16 DIAGNOSIS — E039 Hypothyroidism, unspecified: Secondary | ICD-10-CM | POA: Diagnosis not present

## 2019-03-16 DIAGNOSIS — K449 Diaphragmatic hernia without obstruction or gangrene: Secondary | ICD-10-CM

## 2019-03-16 HISTORY — DX: Diaphragmatic hernia without obstruction or gangrene: K44.9

## 2019-03-16 LAB — T4, FREE: Free T4: 1.12 ng/dL (ref 0.60–1.60)

## 2019-03-16 LAB — TSH: TSH: 0.87 u[IU]/mL (ref 0.35–4.50)

## 2019-03-16 LAB — LIPID PANEL
Cholesterol: 187 mg/dL (ref 0–200)
HDL: 51.6 mg/dL (ref >39.00)
LDL Cholesterol: 109 mg/dL — ABNORMAL HIGH (ref 0–99)
NonHDL: 135.56
Total CHOL/HDL Ratio: 4
Triglycerides: 132 mg/dL (ref 0.0–149.0)
VLDL: 26.4 mg/dL (ref 0.0–40.0)

## 2019-03-16 MED ORDER — ROSUVASTATIN CALCIUM 10 MG PO TABS: 10.0000 mg | ORAL_TABLET | Freq: Every day | ORAL | 3 refills | Status: DC

## 2019-03-16 MED ORDER — CYANOCOBALAMIN 1000 MCG/ML IJ SOLN: 1000.0000 ug | INTRAMUSCULAR | 0 refills | Status: DC

## 2019-03-16 MED ORDER — CYANOCOBALAMIN 1000 MCG/ML IJ SOLN
1000.0000 ug | Freq: Once | INTRAMUSCULAR | Status: AC
  Administered 2019-03-16: 1000 ug via INTRAMUSCULAR

## 2019-03-16 MED ORDER — "SYRINGE 25G X 1"" 3 ML MISC": 1.0000 | 1 refills | Status: DC

## 2019-03-16 MED ORDER — GABAPENTIN 300 MG PO CAPS: 300.0000 mg | ORAL_CAPSULE | Freq: Three times a day (TID) | ORAL | 5 refills | Status: DC

## 2019-03-16 MED ORDER — AZELASTINE HCL 0.1 % NA SOLN: 1.0000 | Freq: Two times a day (BID) | NASAL | 11 refills | Status: DC

## 2019-03-16 NOTE — Patient Instructions (Signed)
Please return in 3 months for recheck.  Today I have reordered the larger gabapentin pill for you to take 3x/day. I am checking your cholesterol and thyroid levels. I will dose your thyroid medications once the results return.   I have referred your for a mammogram and bone density test.   We will start having you take your vitamin B12 injections at home. The medications have been ordered.   If you have any questions or concerns, please don't hesitate to send me a message via MyChart or call the office at [phone]. Thank you for visiting with [place] today! It's our pleasure caring for you.  Cyanocobalamin, Vitamin B12 injection What is this medicine? CYANOCOBALAMIN (sye an oh koe BAL a min) is a man made form of vitamin B12. Vitamin B12 is used in the growth of healthy blood cells, nerve cells, and proteins in the body. It also helps with the metabolism of fats and carbohydrates. This medicine is used to treat people who can not absorb vitamin B12. This medicine may be used for other purposes; ask your health care provider or pharmacist if you have questions. COMMON BRAND NAME(S): B-12 Compliance Kit, B-12 Injection Kit, Cyomin, LA-12, Nutri-Twelve, Physicians EZ Use B-12, Primabalt What should I tell my health care provider before I take this medicine? They need to know if you have any of these conditions:  kidney disease  Leber's disease  megaloblastic anemia  an unusual or allergic reaction to cyanocobalamin, cobalt, other medicines, foods, dyes, or preservatives  pregnant or trying to get pregnant  breast-feeding How should I use this medicine? This medicine is injected into a muscle or deeply under the skin. It is usually given by a health care professional in a clinic or doctor's office. However, your doctor may teach you how to inject yourself. Follow all instructions. Talk to your pediatrician regarding the use of this medicine in children. Special care may be  needed. Overdosage: If you think you have taken too much of this medicine contact a poison control center or emergency room at once. NOTE: This medicine is only for you. Do not share this medicine with others. What if I miss a dose? If you are given your dose at a clinic or doctor's office, call to reschedule your appointment. If you give your own injections and you miss a dose, take it as soon as you can. If it is almost time for your next dose, take only that dose. Do not take double or extra doses. What may interact with this medicine?  colchicine  heavy alcohol intake This list may not describe all possible interactions. Give your health care provider a list of all the medicines, herbs, non-prescription drugs, or dietary supplements you use. Also tell them if you smoke, drink alcohol, or use illegal drugs. Some items may interact with your medicine. What should I watch for while using this medicine? Visit your doctor or health care professional regularly. You may need blood work done while you are taking this medicine. You may need to follow a special diet. Talk to your doctor. Limit your alcohol intake and avoid smoking to get the best benefit. What side effects may I notice from receiving this medicine? Side effects that you should report to your doctor or health care professional as soon as possible:  allergic reactions like skin rash, itching or hives, swelling of the face, lips, or tongue  blue tint to skin  chest tightness, pain  difficulty breathing, wheezing  dizziness  red,  swollen painful area on the leg Side effects that usually do not require medical attention (report to your doctor or health care professional if they continue or are bothersome):  diarrhea  headache This list may not describe all possible side effects. Call your doctor for medical advice about side effects. You may report side effects to FDA at 1-800-FDA-1088. Where should I keep my medicine? Keep  out of the reach of children. Store at room temperature between 15 and 30 degrees C (59 and 85 degrees F). Protect from light. Throw away any unused medicine after the expiration date. NOTE: This sheet is a summary. It may not cover all possible information. If you have questions about this medicine, talk to your doctor, pharmacist, or health care provider.  2020 Elsevier/Gold Standard (2007-07-31 22:10:20)

## 2019-03-16 NOTE — Progress Notes (Signed)
Subjective  CC:  Chief Complaint  Patient presents with  . Establish Care    toc  . Hypothyroidism    HPI: Latasha Mclaughlin is a 73 y.o. female who presents to [place] at [place] today to establish care with me as a new patient.  I have reviewed multiple records in chart. Reviewed labs. Sees GI, SM. She has the following concerns or needs:  Very pleasant 73 yo with low thyroid on 2 pills of thyroid meds daily with last tsh at goal, HLD due for lipid recheck on statin, and workup ongoing for burning, paresthesias from SM and neuro.   Treated for vit B12 def with monthly injections on high dose PPI for GERD and HH. See GI. Recent CT scan reviewed and ok. Scheduled for EGD.  Overdue for mammo, dexa, flu vaccine. Had AWV today.   Assessment  1. B12 deficiency   2. Gastroesophageal reflux disease, unspecified whether esophagitis present   3. Hiatal hernia   4. Acquired hypothyroidism   5. Mixed hyperlipidemia   6. Vitamin B12 deficiency   7. Paresthesia   8. Encounter for screening mammogram for breast cancer   9. Hypoestrogenism      Plan   Vit B12 def: to start home injections given lives in [place]  GERD per gi  Recheck TFTs; would like to get her on single pill levo if able.   HLD recheck on crestor.   Paresthesias: per neuro and SM: refilled gabapentin. Current dose is helpful.   Follow up:  Return in about 4 weeks (around 04/13/2019) for b12 injection. Orders Placed This Encounter  Procedures  . mammogram, screening BC  . Dexa BC  . TSH  . T4, free  . Lipids   Meds ordered this encounter  Medications  . cyanocobalamin ((VITAMIN B-12)) injection 1,000 mcg  . gabapentin (NEURONTIN) 300 MG capsule    Sig: Take 1 capsule (300 mg total) by mouth 3 (three) times daily.    Dispense:  270 capsule    Refill:  5  . rosuvastatin (CRESTOR) 10 MG tablet    Sig: Take 1 tablet (10 mg total) by mouth daily.    Dispense:  90 tablet    Refill:  3   . azelastine (ASTELIN) 0.1 % nasal spray    Sig: Place 1 spray into both nostrils 2 (two) times daily.    Dispense:  30 mL    Refill:  11     Depression screen [facility] 2/9 03/16/2019 12/16/2017 12/15/2016  Decreased Interest 0 0 0  Down, Depressed, Hopeless 0 0 0  PHQ - 2 Score 0 0 0    We updated and reviewed the patient's past history in detail and it is documented below.  Patient Active Problem List   Diagnosis Date Noted  . Colon polyp 11/18/2017    Priority: High  . Hypothyroidism 12/15/2016    Priority: High  . Mixed hyperlipidemia 12/15/2016    Priority: High  . GERD (gastroesophageal reflux disease) 03/16/2019    Priority: Medium  . Hiatal hernia 03/16/2019    Priority: Medium  . Vitamin B12 deficiency 10/20/2018    Priority: Low  . History of hyperparathyroidism 01/08/2017    Priority: Low  . Paresthesia 01/05/2017    Priority: Low   Health Maintenance  Topic Date Due  . DEXA SCAN  07/30/2016  . MAMMOGRAM  07/16/2018  . COLONOSCOPY  10/06/2023  . INFLUENZA VACCINE  Completed  . Hepatitis C Screening  Completed  . PNA vac Low Risk Adult  Completed   Immunization History  Administered Date(s) Administered  . Fluad Quad(high Dose 65+) 03/16/2019  . Influenza, High Dose Seasonal PF 01/19/2017, 01/27/2018  . Influenza-Unspecified 03/19/2015, 01/21/2016, 01/27/2018  . Pneumococcal Conjugate-13 01/21/2016   Current Meds  Medication Sig  . azelastine (ASTELIN) 0.1 % nasal spray Place 1 spray into both nostrils 2 (two) times daily.  . carboxymethylcellulose 1 % ophthalmic solution Apply 1 drop to eye 3 (three) times daily.  . Cholecalciferol (VITAMIN D) 50 MCG (2000 UT) tablet Take 2,000 Units by mouth daily.  . Cyanocobalamin (VITAMIN B-12 IJ) Inject as directed every 30 (thirty) days.  Marland Kitchen gabapentin (NEURONTIN) 300 MG capsule Take 1 capsule (300 mg total) by mouth 3 (three) times daily.  Marland Kitchen levothyroxine (SYNTHROID) 100 MCG tablet Take 100 mcg by mouth daily before  breakfast.  . levothyroxine (SYNTHROID) 88 MCG tablet Take by mouth daily.  Marland Kitchen omeprazole (PRILOSEC) 40 MG capsule One daily in the morning before breakfast  . rosuvastatin (CRESTOR) 10 MG tablet Take 1 tablet (10 mg total) by mouth daily.  Marland Kitchen thiamine (VITAMIN B-1) 100 MG tablet Take 100 mg by mouth daily.  . [DISCONTINUED] azelastine (ASTELIN) 0.1 % nasal spray Place 2 sprays into both nostrils 2 (two) times daily. Use in each nostril as directed  . [DISCONTINUED] gabapentin (NEURONTIN) 100 MG capsule TAKE 1 TO 3 CAPSULES BY MOUTH THREE TIMES DAILY AS NEEDED OR AS DIRECTED  . [DISCONTINUED] rosuvastatin (CRESTOR) 10 MG tablet Take 10 mg by mouth daily.    Allergies: Patient is allergic to prevacid [lansoprazole] and doxycycline. Past Medical History Patient  has a past medical history of Allergy, Arthritis, Back pain, GERD (gastroesophageal reflux disease), Hiatal hernia (03/16/2019), History of tonsillectomy, Hyperlipidemia (12/15/2016), Hypothyroidism (12/15/2016), Osteoporosis, and Sinus infection. Past Surgical History Patient  has a past surgical history that includes Anterior cervical decomp/discectomy fusion (2002); Left oophorectomy; Tonsillectomy; Parathyroidectomy; Adenoidectomy; Dilation and curettage of uterus; Shoulder surgery; Colonoscopy (10/06/2018); and Polypectomy. Family History: Patient family history includes Cancer in her father; Colon polyps in her father and sister; Congestive Heart Failure in her mother; Diabetes in her father and son; Drug abuse in her brother; Heart disease in her father and mother; Hyperlipidemia in her father and mother; Hypertension in her mother; Thyroid disease in her sister. Social History:  Patient  reports that she has quit smoking. She has never used smokeless tobacco. She reports that she does not drink alcohol or use drugs.  Review of Systems: Constitutional: negative for fever or malaise Ophthalmic: negative for photophobia, double vision  or loss of vision Cardiovascular: negative for chest pain, dyspnea on exertion, or new LE swelling Respiratory: negative for SOB or persistent cough Gastrointestinal: negative for abdominal pain, change in bowel habits or melena Genitourinary: negative for dysuria or gross hematuria Musculoskeletal: negative for new gait disturbance or muscular weakness Integumentary: negative for new or persistent rashes Neurological: negative for TIA or stroke symptoms Psychiatric: negative for SI or delusions Allergic/Immunologic: negative for hives  Patient Care Team    Relationship Specialty Notifications Start End  Leamon Arnt, MD PCP - General Family Medicine  03/16/19   Alda Berthold, DO Consulting Physician Neurology  11/21/18   Ladene Artist, MD Consulting Physician Gastroenterology  03/16/19   Eunice Blase, MD Consulting Physician Sports Medicine  03/16/19   Caprice Beaver, DPM Consulting Physician Podiatry  03/16/19     Objective  Vitals: BP (!) 148/82 (BP Location: Left  Arm, Patient Position: Sitting, Cuff Size: Normal)   Pulse 62   Temp (!) 97.4 F (36.3 C) (Temporal)   Ht 5\' 6"  (1.676 m)   Wt 177 lb (80.3 kg)   BMI 28.57 kg/m  General:  Well developed, well nourished, no acute distress  Psych:  Alert and oriented,normal mood and affect HEENT:  Normocephalic, atraumatic, Cardiovascular:  RRR without gallop, rub or murmur, nondisplaced PMI Respiratory:  Good breath sounds bilaterally, CTAB with normal respiratory effort   Commons side effects, risks, benefits, and alternatives for medications and treatment plan prescribed today were discussed, and the patient expressed understanding of the given instructions. Patient is instructed to call or message via MyChart if he/she has any questions or concerns regarding our treatment plan. No barriers to understanding were identified. We discussed Red Flag symptoms and signs in detail. Patient expressed understanding regarding  what to do in case of urgent or emergency type symptoms.   Medication list was reconciled, printed and provided to the patient in AVS. Patient instructions and summary information was reviewed with the patient as documented in the AVS. This note was prepared with assistance of Dragon voice recognition software. Occasional wrong-word or sound-a-like substitutions may have occurred due to the inherent limitations of voice recognition software

## 2019-03-16 NOTE — Progress Notes (Signed)
I have reviewed the documentation from the recent AWV done by Courtney Slade, RN; I agree with the documentation and will follow up on any recommendations or abnormal findings as suggested.  

## 2019-03-16 NOTE — Patient Instructions (Signed)
[name] , Thank you for taking time to come for your Medicare Wellness Visit. I appreciate your ongoing commitment to your health goals. Please review the following plan we discussed and let me know if I can assist you in the future.   Screening recommendations/referrals: Colorectal Screening: up to date; last 10/06/18 with Dr. Fuller Plan  Mammogram: recommended; last 07/15/17 Bone Density: recommended; last 07/31/14  Vision and Dental Exams: Recommended annual ophthalmology exams for early detection of glaucoma and other disorders of the eye Recommended annual dental exams for proper oral hygiene  Vaccinations: Influenza vaccine: today  Pneumococcal vaccine: up to date; last 01/21/16 Tdap vaccine: recommended; Please call your insurance company to determine your out of pocket expense. You may also receive this vaccine at your local pharmacy or Health Dept. Shingles vaccine: Please call your insurance company to determine your out of pocket expense for the Shingrix vaccine. You may receive this vaccine at your local pharmacy.  Advanced directives: Advance directives discussed with you today. I have provided a copy for you to complete at home and have notarized. Once this is complete please bring a copy in to our office so we can scan it into your chart.  Goals: Recommend to drink at least 6-8 8oz glasses of water per day and consume a balanced diet rich in fresh fruits and vegetables.   Next appointment: Please schedule your Annual Wellness Visit with your Nurse Health Advisor in one year.  Preventive Care 12 Years and Older, Female Preventive care refers to lifestyle choices and visits with your health care provider that can promote health and wellness. What does preventive care include?  A yearly physical exam. This is also called an annual well check.  Dental exams once or twice a year.  Routine eye exams. Ask your health care provider how often you should have your eyes  checked.  Personal lifestyle choices, including:  Daily care of your teeth and gums.  Regular physical activity.  Eating a healthy diet.  Avoiding tobacco and drug use.  Limiting alcohol use.  Practicing safe sex.  Taking low-dose aspirin every day if recommended by your health care provider.  Taking vitamin and mineral supplements as recommended by your health care provider. What happens during an annual well check? The services and screenings done by your health care provider during your annual well check will depend on your age, overall health, lifestyle risk factors, and family history of disease. Counseling  Your health care provider may ask you questions about your:  Alcohol use.  Tobacco use.  Drug use.  Emotional well-being.  Home and relationship well-being.  Sexual activity.  Eating habits.  History of falls.  Memory and ability to understand (cognition).  Work and work Statistician.  Reproductive health. Screening  You may have the following tests or measurements:  Height, weight, and BMI.  Blood pressure.  Lipid and cholesterol levels. These may be checked every 5 years, or more frequently if you are over 39 years old.  Skin check.  Lung cancer screening. You may have this screening every year starting at age 41 if you have a 30-pack-year history of smoking and currently smoke or have quit within the past 15 years.  Fecal occult blood test (FOBT) of the stool. You may have this test every year starting at age 71.  Flexible sigmoidoscopy or colonoscopy. You may have a sigmoidoscopy every 5 years or a colonoscopy every 10 years starting at age 22.  Hepatitis C blood test.  Hepatitis  B blood test.  Sexually transmitted disease (STD) testing.  Diabetes screening. This is done by checking your blood sugar (glucose) after you have not eaten for a while (fasting). You may have this done every 1-3 years.  Bone density scan. This is done to  screen for osteoporosis. You may have this done starting at age 55.  Mammogram. This may be done every 1-2 years. Talk to your health care provider about how often you should have regular mammograms. Talk with your health care provider about your test results, treatment options, and if necessary, the need for more tests. Vaccines  Your health care provider may recommend certain vaccines, such as:  Influenza vaccine. This is recommended every year.  Tetanus, diphtheria, and acellular pertussis (Tdap, Td) vaccine. You may need a Td booster every 10 years.  Zoster vaccine. You may need this after age 35.  Pneumococcal 13-valent conjugate (PCV13) vaccine. One dose is recommended after age 59.  Pneumococcal polysaccharide (PPSV23) vaccine. One dose is recommended after age 70. Talk to your health care provider about which screenings and vaccines you need and how often you need them. This information is not intended to replace advice given to you by your health care provider. Make sure you discuss any questions you have with your health care provider. Document Released: 05/16/2015 Document Revised: 01/07/2016 Document Reviewed: 02/18/2015 Elsevier Interactive Patient Education  2017 [place] Prevention in the Home Falls can cause injuries. They can happen to people of all ages. There are many things you can do to make your home safe and to help prevent falls. What can I do on the outside of my home?  Regularly fix the edges of walkways and driveways and fix any cracks.  Remove anything that might make you trip as you walk through a door, such as a raised step or threshold.  Trim any bushes or trees on the path to your home.  Use bright outdoor lighting.  Clear any walking paths of anything that might make someone trip, such as rocks or tools.  Regularly check to see if handrails are loose or broken. Make sure that both sides of any steps have handrails.  Any raised decks  and porches should have guardrails on the edges.  Have any leaves, snow, or ice cleared regularly.  Use sand or salt on walking paths during winter.  Clean up any spills in your garage right away. This includes oil or grease spills. What can I do in the bathroom?  Use night lights.  Install grab bars by the toilet and in the tub and shower. Do not use towel bars as grab bars.  Use non-skid mats or decals in the tub or shower.  If you need to sit down in the shower, use a plastic, non-slip stool.  Keep the floor dry. Clean up any water that spills on the floor as soon as it happens.  Remove soap buildup in the tub or shower regularly.  Attach bath mats securely with double-sided non-slip rug tape.  Do not have throw rugs and other things on the floor that can make you trip. What can I do in the bedroom?  Use night lights.  Make sure that you have a light by your bed that is easy to reach.  Do not use any sheets or blankets that are too big for your bed. They should not hang down onto the floor.  Have a firm chair that has side arms. You can use this for  support while you get dressed.  Do not have throw rugs and other things on the floor that can make you trip. What can I do in the kitchen?  Clean up any spills right away.  Avoid walking on wet floors.  Keep items that you use a lot in easy-to-reach places.  If you need to reach something above you, use a strong step stool that has a grab bar.  Keep electrical cords out of the way.  Do not use floor polish or wax that makes floors slippery. If you must use wax, use non-skid floor wax.  Do not have throw rugs and other things on the floor that can make you trip. What can I do with my stairs?  Do not leave any items on the stairs.  Make sure that there are handrails on both sides of the stairs and use them. Fix handrails that are broken or loose. Make sure that handrails are as long as the stairways.  Check any  carpeting to make sure that it is firmly attached to the stairs. Fix any carpet that is loose or worn.  Avoid having throw rugs at the top or bottom of the stairs. If you do have throw rugs, attach them to the floor with carpet tape.  Make sure that you have a light switch at the top of the stairs and the bottom of the stairs. If you do not have them, ask someone to add them for you. What else can I do to help prevent falls?  Wear shoes that:  Do not have high heels.  Have rubber bottoms.  Are comfortable and fit you well.  Are closed at the toe. Do not wear sandals.  If you use a stepladder:  Make sure that it is fully opened. Do not climb a closed stepladder.  Make sure that both sides of the stepladder are locked into place.  Ask someone to hold it for you, if possible.  Clearly mark and make sure that you can see:  Any grab bars or handrails.  First and last steps.  Where the edge of each step is.  Use tools that help you move around (mobility aids) if they are needed. These include:  Canes.  Walkers.  Scooters.  Crutches.  Turn on the lights when you go into a dark area. Replace any light bulbs as soon as they burn out.  Set up your furniture so you have a clear path. Avoid moving your furniture around.  If any of your floors are uneven, fix them.  If there are any pets around you, be aware of where they are.  Review your medicines with your doctor. Some medicines can make you feel dizzy. This can increase your [place] of falling. Ask your doctor what other things that you can do to help prevent falls. This information is not intended to replace advice given to you by your health care provider. Make sure you discuss any questions you have with your health care provider. Document Released: 02/13/2009 Document Revised: 09/25/2015 Document Reviewed: 05/24/2014 Elsevier Interactive Patient Education  2017 [employer].

## 2019-03-16 NOTE — Telephone Encounter (Signed)
Please see her mychart note and correct her dosage of synthroid. thanks

## 2019-03-16 NOTE — Progress Notes (Signed)
Subjective:   Latasha Mclaughlin is a 73 y.o. female who presents for an Initial Medicare Annual Wellness Visit.  Review of Systems     Cardiac Risk Factors include: advanced age (>38men, >12 women);dyslipidemia     Objective:    Today's Vitals   03/16/19 1316  BP: (!) 148/82  Temp: (!) 97.4 F (36.3 C)  TempSrc: Temporal  Weight: 177 lb (80.3 kg)  Height: 5\' 6"  (1.676 m)   Body mass index is 28.57 kg/m.  Advanced Directives 03/16/2019 02/03/2019 11/21/2018 10/21/2018  Does Patient Have a Medical Advance Directive? No No No No  Does patient want to make changes to medical advance directive? Yes (MAU/Ambulatory/Procedural Areas - Information given) - - -  Would patient like information on creating a medical advance directive? - - - No - Guardian declined    Current Medications (verified) Outpatient Encounter Medications as of 03/16/2019  Medication Sig  . carboxymethylcellulose 1 % ophthalmic solution Apply 1 drop to eye 3 (three) times daily.  . Cholecalciferol (VITAMIN D) 50 MCG (2000 UT) tablet Take 2,000 Units by mouth daily.  . Cyanocobalamin (VITAMIN B-12 IJ) Inject as directed every 30 (thirty) days.  Marland Kitchen levothyroxine (SYNTHROID) 100 MCG tablet Take 100 mcg by mouth daily before breakfast.  . levothyroxine (SYNTHROID) 88 MCG tablet Take by mouth daily.  Marland Kitchen omeprazole (PRILOSEC) 40 MG capsule One daily in the morning before breakfast  . thiamine (VITAMIN B-1) 100 MG tablet Take 100 mg by mouth daily.  . [DISCONTINUED] azelastine (ASTELIN) 0.1 % nasal spray Place 2 sprays into both nostrils 2 (two) times daily. Use in each nostril as directed  . [DISCONTINUED] gabapentin (NEURONTIN) 100 MG capsule TAKE 1 TO 3 CAPSULES BY MOUTH THREE TIMES DAILY AS NEEDED OR AS DIRECTED  . [DISCONTINUED] rosuvastatin (CRESTOR) 10 MG tablet Take 10 mg by mouth daily.  . Diphenhyd-Hydrocort-Nystatin (FIRST-DUKES MOUTHWASH) SUSP 5 ML SWISH AND SPIT QID X 10 DAYS (Patient not taking: Reported on  03/16/2019)  . [DISCONTINUED] baclofen (LIORESAL) 10 MG tablet Take 0.5-1 tablets (5-10 mg total) by mouth 3 (three) times daily as needed for muscle spasms. (Patient not taking: Reported on 03/16/2019)  . [DISCONTINUED] diazepam (VALIUM) 5 MG tablet 1 PO 1 hour before MRI, repeat prn  . [DISCONTINUED] HYDROcodone-acetaminophen (NORCO/VICODIN) 5-325 MG tablet Take 1 tablet by mouth every 6 (six) hours as needed.   No facility-administered encounter medications on file as of 03/16/2019.     Allergies (verified) Prevacid [lansoprazole] and Doxycycline   History: Past Medical History:  Diagnosis Date  . Allergy    SEASONAL  . Arthritis   . Back pain   . GERD (gastroesophageal reflux disease)   . Hiatal hernia 03/16/2019  . History of tonsillectomy   . Hyperlipidemia 12/15/2016  . Hypothyroidism 12/15/2016  . Osteoporosis   . Sinus infection    Past Surgical History:  Procedure Laterality Date  . ADENOIDECTOMY    . ANTERIOR CERVICAL DECOMP/DISCECTOMY FUSION  2002  . COLONOSCOPY  10/06/2018  . DILATION AND CURETTAGE OF UTERUS     X2  . LEFT OOPHORECTOMY    . PARATHYROIDECTOMY    . POLYPECTOMY    . SHOULDER SURGERY    . TONSILLECTOMY     Family History  Problem Relation Age of Onset  . Hyperlipidemia Mother   . Hypertension Mother   . Congestive Heart Failure Mother   . Heart disease Mother   . Cancer Father   . Diabetes Father   .  Heart disease Father   . Hyperlipidemia Father   . Colon polyps Father   . Thyroid disease Sister   . Colon polyps Sister   . Drug abuse Brother   . Diabetes Son   . Colon cancer Neg Hx   . Esophageal cancer Neg Hx   . Stomach cancer Neg Hx   . Rectal cancer Neg Hx    Social History   Socioeconomic History  . Marital status: Married    Spouse name: Not on file  . Number of children: Not on file  . Years of education: Not on file  . Highest education level: Not on file  Occupational History  . Not on file  Social Needs  .  Financial resource strain: Not on file  . Food insecurity    Worry: Not on file    Inability: Not on file  . Transportation needs    Medical: Not on file    Non-medical: Not on file  Tobacco Use  . Smoking status: Former Research scientist (life sciences)  . Smokeless tobacco: Never Used  . Tobacco comment: Quit in 1985  Substance and Sexual Activity  . Alcohol use: No  . Drug use: No  . Sexual activity: Yes  Lifestyle  . Physical activity    Days per week: Not on file    Minutes per session: Not on file  . Stress: Not on file  Relationships  . Social Herbalist on phone: Not on file    Gets together: Not on file    Attends religious service: Not on file    Active member of club or organization: Not on file    Attends meetings of clubs or organizations: Not on file    Relationship status: Not on file  Other Topics Concern  . Not on file  Social History Narrative   Lives with husband      Lives in two story home      Right handed      Highest level of edu- GED      Retired      Previously lived in [place] given: Not Answered Comment: Quit in [place]:  Pre-visit preparation completed: Yes  Pain : No/denies pain(does have paresthesia)  Diabetes: No  How often do you need to have someone help you when you read instructions, pamphlets, or other written materials from your doctor or pharmacy?: 1 - Never  Interpreter Needed?: No  Information entered by :: Denman George LPN   Activities of Daily Living In your present state of health, do you have any difficulty performing the following activities: 03/16/2019  Hearing? N  Vision? N  Difficulty concentrating or making decisions? N  Walking or climbing stairs? N  Dressing or bathing? N  Doing errands, shopping? N  Preparing Food and eating ? N  Using the Toilet? N  In the past six months, have you accidently leaked urine? N  Do you have problems with loss of bowel  control? N  Managing your Medications? N  Managing your Finances? N  Housekeeping or managing your Housekeeping? N  Some recent data might be hidden     Immunizations and Health Maintenance Immunization History  Administered Date(s) Administered  . Fluad Quad(high Dose 65+) 03/16/2019  . Influenza, High Dose Seasonal PF 01/19/2017, 01/27/2018  . Influenza-Unspecified 03/19/2015, 01/21/2016, 01/27/2018  . Pneumococcal Conjugate-13 01/21/2016   Health Maintenance Due  Topic Date Due  .  DEXA SCAN  07/30/2016  . MAMMOGRAM  07/16/2018    Patient Care Team: Leamon Arnt, MD as PCP - General (Family Medicine) Alda Berthold, DO as Consulting Physician (Neurology) Ladene Artist, MD as Consulting Physician (Gastroenterology) Eunice Blase, MD as Consulting Physician (Sports Medicine) Caprice Beaver, DPM as Consulting Physician (Podiatry)  Indicate any recent Medical Services you may have received from other than [facility] providers in the past year (date may be approximate).     Assessment:   This is a routine wellness examination for Latasha Mclaughlin.  Hearing/Vision screen No exam data present  Dietary issues and exercise activities discussed: Current Exercise Habits: The patient does not participate in regular exercise at present  Goals   None    Depression Screen PHQ 2/9 Scores 03/16/2019 12/16/2017 12/15/2016  PHQ - 2 Score 0 0 0    Fall Risk Fall Risk  03/16/2019 11/21/2018 12/16/2017 12/15/2016  Falls in the past year? 1 0 No No  Number falls in past yr: 0 - - -  Injury with Fall? 1 - - -    Is the patient's home free of loose throw rugs in walkways, pet beds, electrical cords, etc?   yes      Grab bars in the bathroom? yes      Handrails on the stairs?   yes      Adequate lighting?   yes  Timed Get Up and Go Performed completed and within normal timeframe; no gait abnormalities noted   Cognitive Function: no cognitive concerns noted at this time Cognitive Testing   Alert? Yes         Normal Appearance?  Oriented to person? Yes           Place? Yes  Time? Yes  Recall of three objects? Yes  Can perform simple calculations? Yes  Displays appropriate judgment? Yes  Can read the correct time from a watch face? Yes   Screening Tests Health Maintenance  Topic Date Due  . DEXA SCAN  07/30/2016  . MAMMOGRAM  07/16/2018  . COLONOSCOPY  10/06/2023  . INFLUENZA VACCINE  Completed  . Hepatitis C Screening  Completed  . PNA vac Low Risk Adult  Completed    Qualifies for Shingles Vaccine? Discussed and patient will check with pharmacy for coverage.  Patient education handout provided   Cancer Screenings: Lung: Low Dose CT Chest recommended if Age 65-80 years, 30 pack-year currently smoking OR have quit w/in 15years. Patient does not qualify. Breast: Up to date on Mammogram? Yes; patient would like to defer for 2020 Up to date of Bone Density/Dexa? No; patient would like to defer for 2020 Colorectal: colonoscopy 10/06/18 with Dr. Fuller Plan   Plan:  I have personally reviewed and addressed the Medicare Annual Wellness questionnaire and have noted the following in the patient's chart:  A. Medical and social history B. Use of alcohol, tobacco or illicit drugs  C. Current medications and supplements D. Functional ability and status E.  Nutritional status F.  Physical activity G. Advance directives H. List of other physicians I.  Hospitalizations, surgeries, and ER visits in previous 12 months J.  [place] such as hearing and vision if needed, cognitive and depression L. Referrals, records requested, and appointments- none   In addition, I have reviewed and discussed with patient certain preventive protocols, quality metrics, and best practice recommendations. A written personalized care plan for preventive services as well as general preventive health recommendations were provided to patient.  Signed,  Denman George, LPN  Nurse Health  Advisor   Nurse Notes: no additional

## 2019-03-19 MED ORDER — LEVOTHYROXINE SODIUM 100 MCG PO TABS: ORAL_TABLET | ORAL | 3 refills | Status: DC

## 2019-03-19 MED ORDER — LEVOTHYROXINE SODIUM 88 MCG PO TABS: ORAL_TABLET | ORAL | 3 refills | Status: DC

## 2019-03-19 NOTE — Addendum Note (Signed)
Addended by: Billey Chang on: 03/19/2019 10:14 AM   Modules accepted: Orders

## 2019-03-20 ENCOUNTER — Other Ambulatory Visit: Payer: Self-pay

## 2019-03-20 NOTE — Telephone Encounter (Signed)
Dosage change has already been documented/updated in patient's chart.

## 2019-03-22 ENCOUNTER — Other Ambulatory Visit: Payer: Self-pay

## 2019-03-22 ENCOUNTER — Encounter: Payer: Self-pay | Admitting: Family Medicine

## 2019-03-22 ENCOUNTER — Ambulatory Visit (HOSPITAL_COMMUNITY)
Admission: RE | Admit: 2019-03-22 | Discharge: 2019-03-22 | Disposition: A | Discharge status: Home / Self Care | Payer: Medicare Other | Source: Ambulatory Visit | Attending: Family Medicine | Admitting: Family Medicine

## 2019-03-22 DIAGNOSIS — Z1231 Encounter for screening mammogram for malignant neoplasm of breast: Secondary | ICD-10-CM | POA: Diagnosis not present

## 2019-03-22 DIAGNOSIS — Z780 Asymptomatic menopausal state: Secondary | ICD-10-CM

## 2019-03-22 DIAGNOSIS — E2839 Other primary ovarian failure: Secondary | ICD-10-CM | POA: Insufficient documentation

## 2019-03-22 DIAGNOSIS — M85851 Other specified disorders of bone density and structure, right thigh: Secondary | ICD-10-CM | POA: Diagnosis not present

## 2019-03-22 DIAGNOSIS — M8580 Other specified disorders of bone density and structure, unspecified site: Secondary | ICD-10-CM

## 2019-03-22 HISTORY — DX: Asymptomatic menopausal state: Z78.0

## 2019-03-22 HISTORY — DX: Other specified disorders of bone density and structure, unspecified site: M85.80

## 2019-03-22 IMAGING — MG DIGITAL SCREENING BILAT W/ TOMO W/ CAD
6 of 12 series · 6 of 36 positions shown · non-contrast
Comparison: Previous exam(s).

ACR Breast Density Category a: The breast tissue is almost entirely
fatty.

CLINICAL DATA: Screening.

EXAM:
DIGITAL SCREENING BILATERAL MAMMOGRAM WITH TOMO AND CAD

[R MLO synth-2D (1 of 2)]
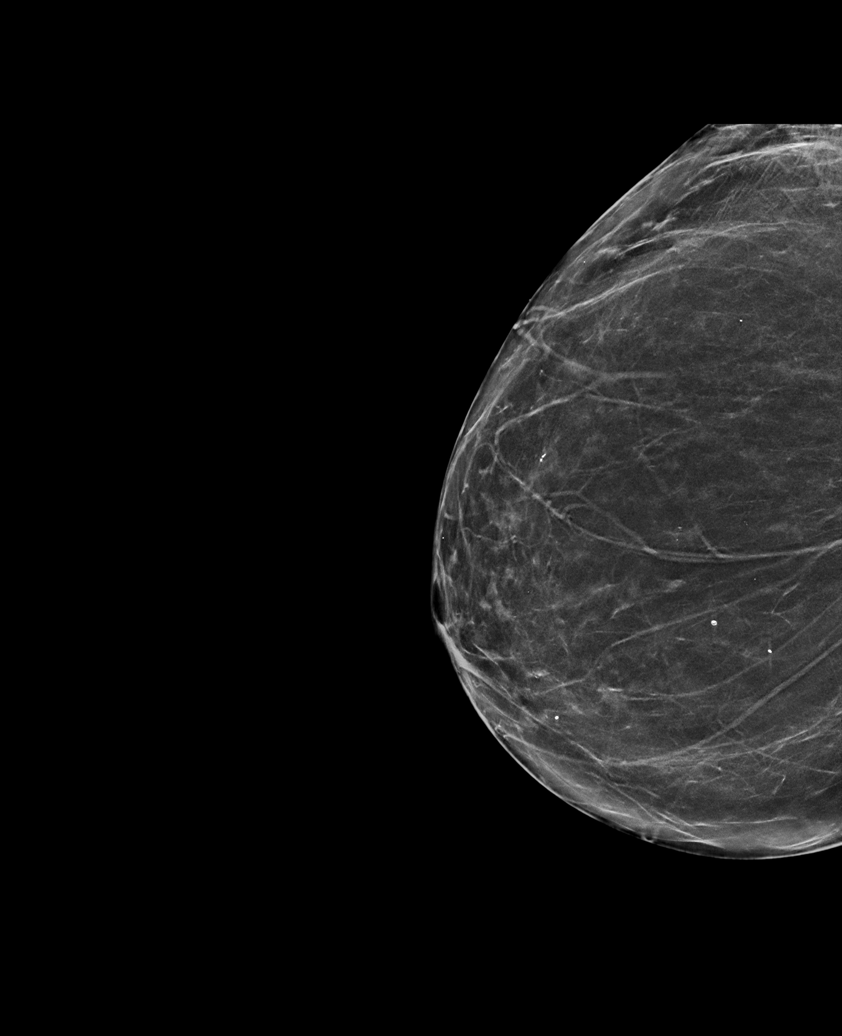

[L MLO synth-2D (1 of 2)]
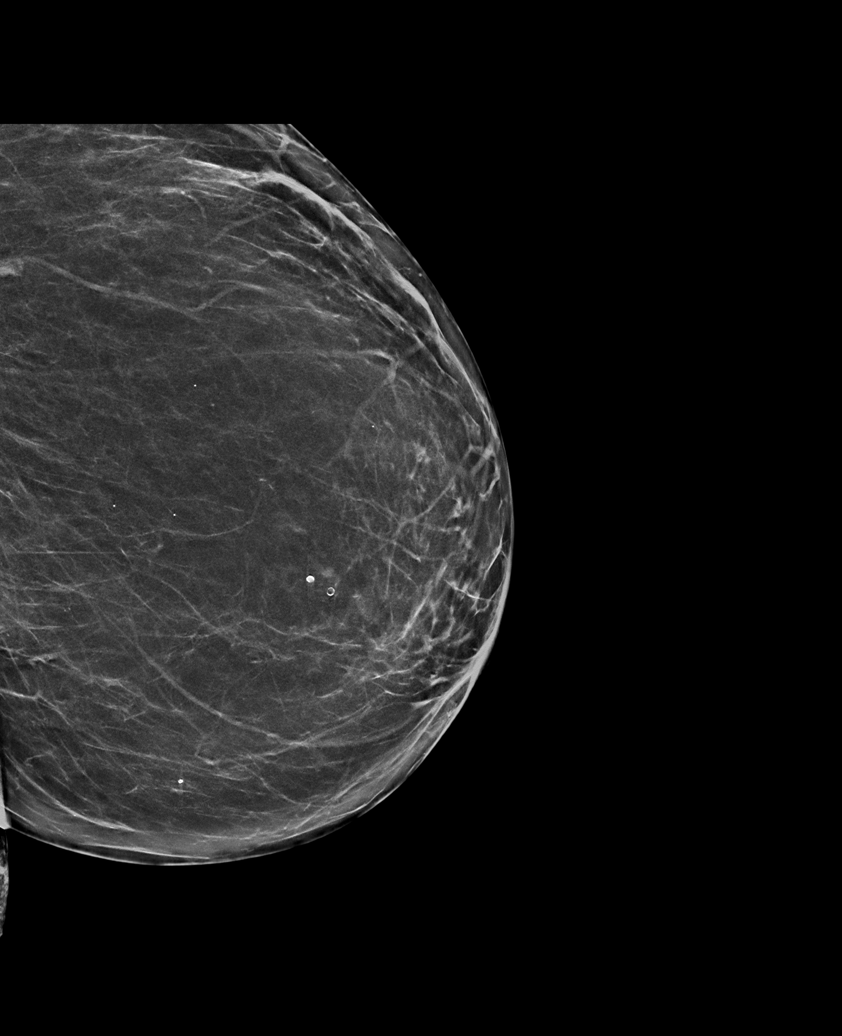

[R CC synth-2D]
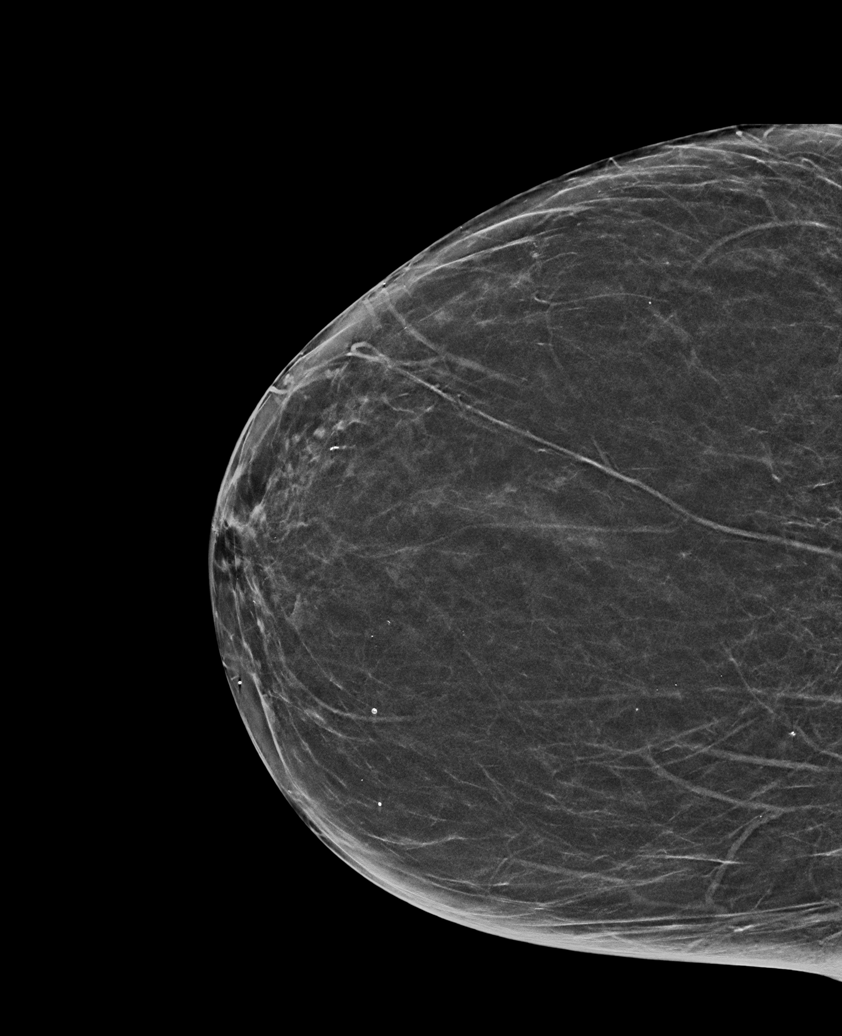

[L MLO synth-2D (2 of 2)]
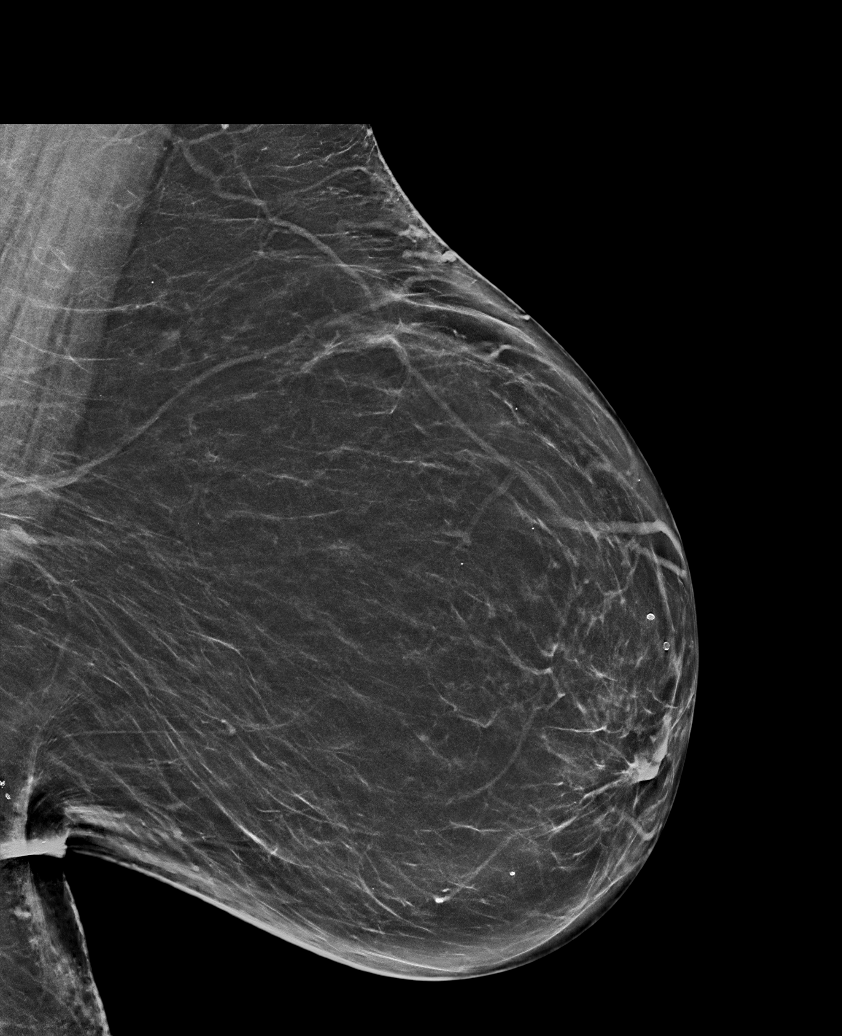

[L CC synth-2D]
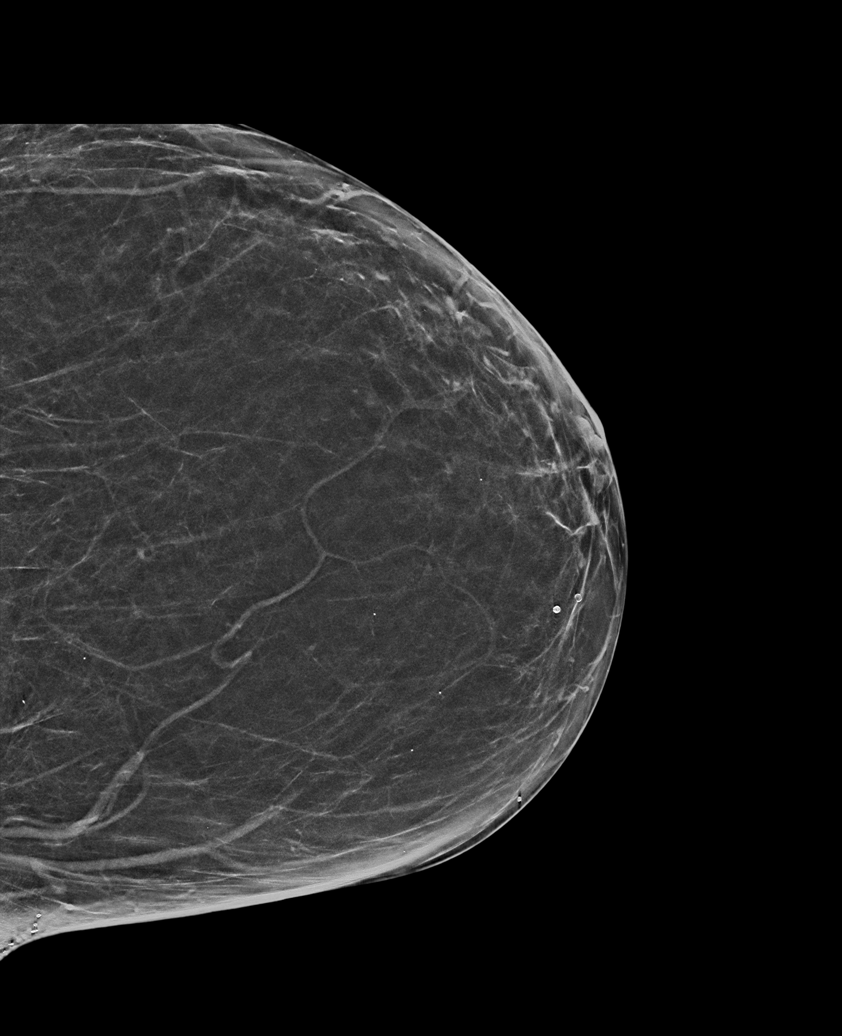

[R MLO synth-2D (2 of 2)]
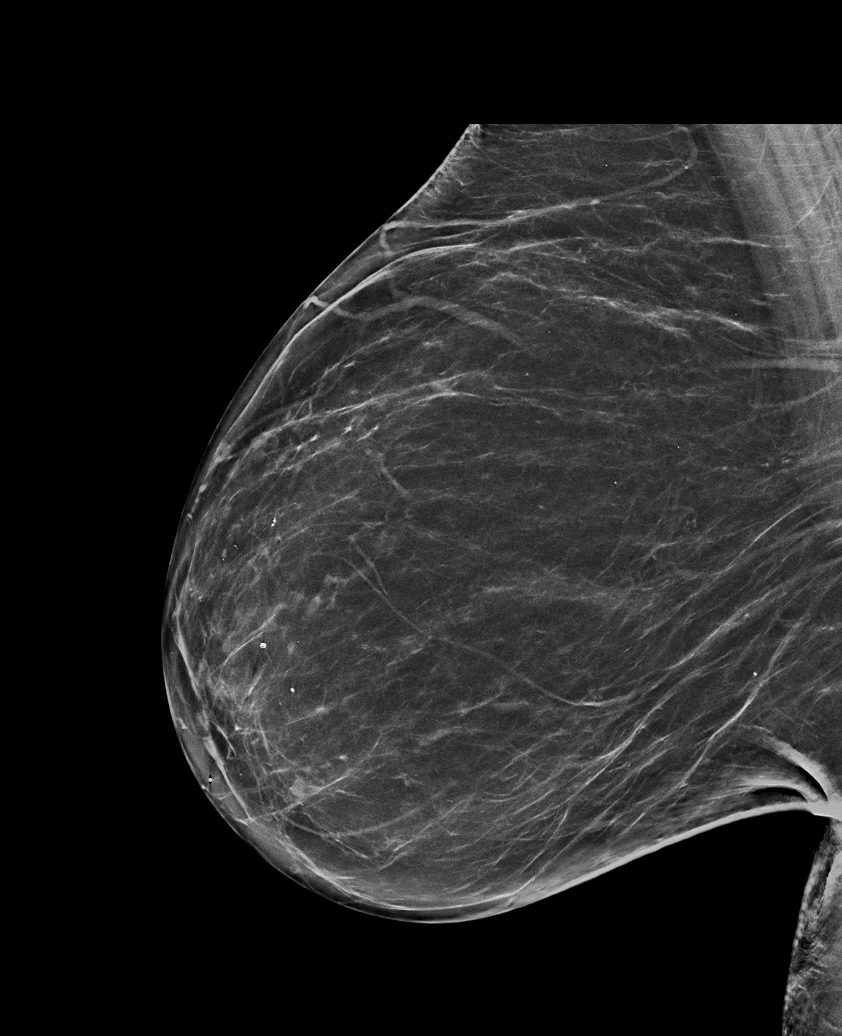

[6 of 36 positions shown; findings below may reference images not displayed]

FINDINGS: There are no findings suspicious for malignancy. Images were
processed with CAD.
IMPRESSION: No mammographic evidence of malignancy. A result letter of this
screening mammogram will be mailed directly to the patient.

RECOMMENDATION:
Screening mammogram in one year. (Code:[TA])

BI-RADS CATEGORY  1: Negative.

## 2019-03-23 ENCOUNTER — Encounter: Payer: Self-pay | Admitting: Family Medicine

## 2019-03-23 ENCOUNTER — Other Ambulatory Visit: Payer: Self-pay

## 2019-03-23 MED ORDER — LEVOTHYROXINE SODIUM 100 MCG PO TABS: ORAL_TABLET | ORAL | 3 refills | Status: DC

## 2019-03-23 MED ORDER — LEVOTHYROXINE SODIUM 88 MCG PO TABS: ORAL_TABLET | ORAL | 3 refills | Status: DC

## 2019-03-28 ENCOUNTER — Other Ambulatory Visit (HOSPITAL_COMMUNITY): Payer: Medicare Other

## 2019-03-28 ENCOUNTER — Other Ambulatory Visit: Payer: Self-pay

## 2019-03-28 ENCOUNTER — Ambulatory Visit (HOSPITAL_COMMUNITY): Payer: Medicare Other

## 2019-04-03 ENCOUNTER — Encounter: Payer: Self-pay | Admitting: Neurology

## 2019-04-04 ENCOUNTER — Telehealth (INDEPENDENT_AMBULATORY_CARE_PROVIDER_SITE_OTHER): Payer: Medicare Other | Admitting: Neurology

## 2019-04-04 ENCOUNTER — Other Ambulatory Visit: Payer: Self-pay

## 2019-04-04 VITALS — Ht 65.5 in | Wt 175.0 lb

## 2019-04-04 DIAGNOSIS — R202 Paresthesia of skin: Secondary | ICD-10-CM

## 2019-04-04 DIAGNOSIS — R2681 Unsteadiness on feet: Secondary | ICD-10-CM | POA: Diagnosis not present

## 2019-04-04 NOTE — Progress Notes (Signed)
Virtual Visit via Video Note The purpose of this virtual visit is to provide medical care while limiting exposure to the novel coronavirus.    Consent was obtained for video visit:  Yes.   Answered questions that patient had about telehealth interaction:  Yes.   I discussed the limitations, risks, security and privacy concerns of performing an evaluation and management service by telemedicine. I also discussed with the patient that there may be a patient responsible charge related to this service. The patient expressed understanding and agreed to proceed.  Pt location: Home Physician Location: office Name of referring provider:  Briscoe Deutscher, DO I connected with Latasha Mclaughlin at patients initiation/request on 04/04/2019 at  9:50 AM EST by video enabled telemedicine application and verified that I am speaking with the correct person using two identifiers. Pt MRN:  [id number] Pt DOB:  1945/10/29 Video Participants:  Latasha Mclaughlin   History of Present Illness: This is a 73 y.o. female returning for follow-up of bilateral leg numbness.  She continues to have numbness of the lower legs and feet which is constant and also reports episodic sharp pain over the arms bilaterally.  She underwent EDX of the legs in August 2020 which showed chronic bilateral L4 radiculopathy, mild.  No evidence of neuropathy.  She suffered a fall in October and had MRI of the lumbar spine due to back pain which showed no significant stenosis of the lumbar spine.  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  Patient reports this finding has been there for many years.  She takes gabapentin 300mg  TID which helps the burning symptoms.  She did not complete physical therapy due to concerns over COVID-19.  She also complains of soreness over the hands and feet, especially with overuse.   Observations/Objective:   Vitals:   04/03/19 0956  Weight: 175 lb (79.4 kg)  Height: 5' 5.5" (1.664 m)   Patient is awake,  alert, and appears comfortable.  Oriented x 4.   Extraocular muscles are intact. No ptosis.  Face is symmetric.  Speech is not dysarthric. Antigravity in all extremities.  No pronator drift.  Finger tapping is normal Gait appears mildly unsteady, unassisted.  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 The disc appears to efface the ventral thecal sac although the central canal is open.  Shallow disc bulge L4-5 without central canal or foraminal stenosis.   NCS/EMG of the legs 12/26/2018: 1. 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 2. There is no evidence of large fiber sensorimotor polyneuropathy affecting the lower extremities.   Assessment and Plan:  1.  Bilateral leg paresthesias, described as numbness.  Work-up has included MRI lumbar spine and electrodiagnostic testing which did not reveal cause for symptoms.  Specifically, there is no evidence of large fiber neuropathy.  She does not describe claudication symptoms, although I did offer her arterial studies of the legs.  Alternatively, I also suggested a trial of physical therapy to see if leg stretching and strengthening may help symptoms.  She would like to hold off on additional testing at this time.  I acknowledged her frustration with lack of diagnosis, but tried to reassure her with the results that we have so far.  Going forward, consider referral to academic center for second opinion. Initially, I felt symptoms are related to vitamin B12 deficiency, however she has been compliant with supplementation and  there has been no improvement.  2.  Bilateral arm dysesthesias, new.  With his new symptoms which do not conform to a cutaneous nerve or dermatomal distribution, I will order MRI brain without contrast to evaluate for demyelinating disease, although my overall suspicion is low.     Follow Up  Instructions:   I discussed the assessment and treatment plan with the patient. The patient was provided an opportunity to ask questions and all were answered. The patient agreed with the plan and demonstrated an understanding of the instructions.   The patient was advised to call back or seek an in-person evaluation if the symptoms worsen or if the condition fails to improve as anticipated.  Follow-up in 4 months  Total time spent:  30 minutes     Latasha Berthold, DO

## 2019-04-04 NOTE — Progress Notes (Signed)
I ordered Mri of brain wo contrast

## 2019-04-11 ENCOUNTER — Other Ambulatory Visit: Payer: Self-pay

## 2019-04-11 DIAGNOSIS — Z20828 Contact with and (suspected) exposure to other viral communicable diseases: Secondary | ICD-10-CM | POA: Diagnosis not present

## 2019-04-11 DIAGNOSIS — Z20822 Contact with and (suspected) exposure to covid-19: Secondary | ICD-10-CM

## 2019-04-12 ENCOUNTER — Telehealth: Payer: Self-pay | Admitting: *Deleted

## 2019-04-12 LAB — NOVEL CORONAVIRUS, NAA: SARS-CoV-2, NAA: NOT DETECTED

## 2019-04-12 NOTE — Telephone Encounter (Signed)
Pt called for result of COVID test on 04/11/2019; informed pt the result is not complete, and the turn around time is based on the number of results that have to be completed; pt also informed results are available first in MyChart, and she will receive a call regarding his results; pt encouraged to answer all calls; she verbalized understanding.

## 2019-04-13 ENCOUNTER — Ambulatory Visit (HOSPITAL_COMMUNITY)
Admission: RE | Admit: 2019-04-13 | Discharge: 2019-04-13 | Disposition: A | Discharge status: Home / Self Care | Payer: Medicare Other | Source: Ambulatory Visit | Attending: Neurology | Admitting: Neurology

## 2019-04-13 ENCOUNTER — Other Ambulatory Visit: Payer: Self-pay

## 2019-04-13 DIAGNOSIS — R202 Paresthesia of skin: Secondary | ICD-10-CM | POA: Diagnosis not present

## 2019-04-13 DIAGNOSIS — R2681 Unsteadiness on feet: Secondary | ICD-10-CM | POA: Diagnosis not present

## 2019-04-13 IMAGING — MR MR HEAD W/O CM
6 of 10 series · 25 of 48 positions shown · non-contrast
Comparison: None.

CLINICAL DATA: Unsteady gait and paresthesia. Numbness in the lower
extremities and arms.

EXAM:
MRI HEAD WITHOUT CONTRAST
TECHNIQUE: Multiplanar, multiecho pulse sequences of the brain and surrounding
structures were obtained without intravenous contrast.

[Series 2: DWI · axial · 3.0mm · 0.73mm/px · z∈[-95,+67]mm · 6 of 55 slices shown (1 of 2)]
[im 1/55]
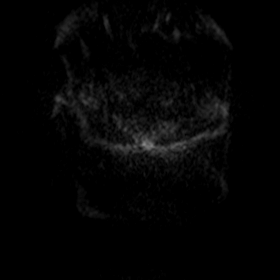
[im 11/55]
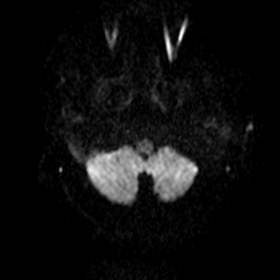
[im 22/55]
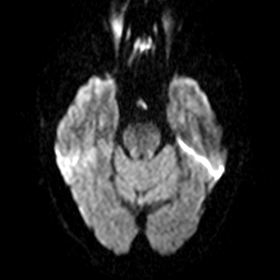
[im 33/55]
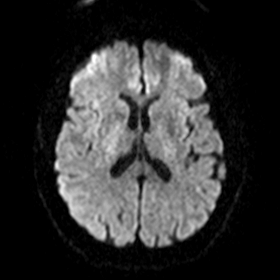
[im 44/55]
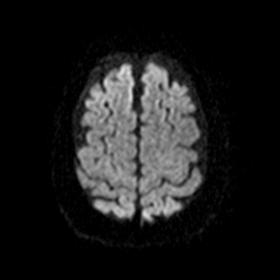
[im 55/55]
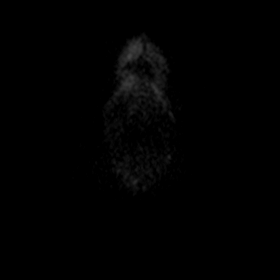

[Series 5: DWI · coronal · 5.0mm · 0.50mm/px · 4 of 36 slices shown (2 of 2)]
[im 1/36]
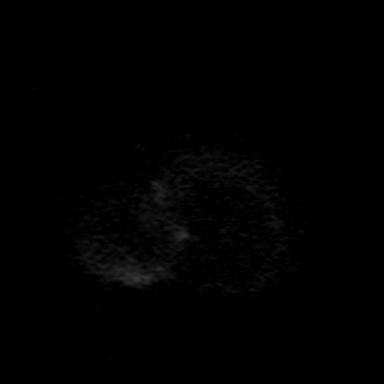
[im 12/36]
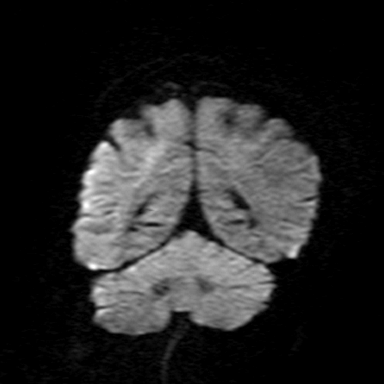
[im 24/36]
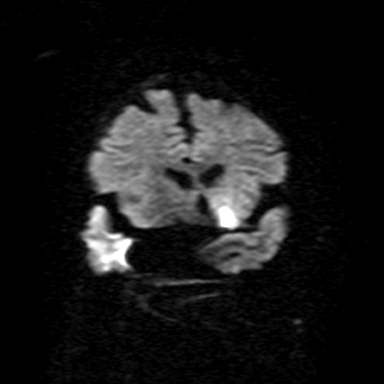
[im 36/36]
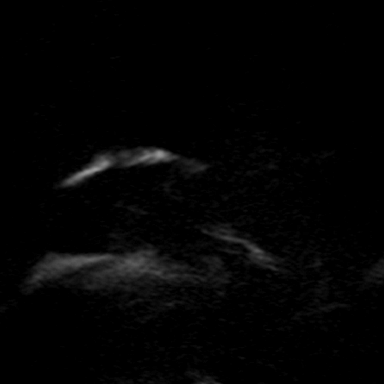

[Series 7: T2 · axial · 5.0mm · 0.46mm/px · z∈[-83,+66]mm · 3 of 24 slices shown (1 of 3)]
[im 1/24]
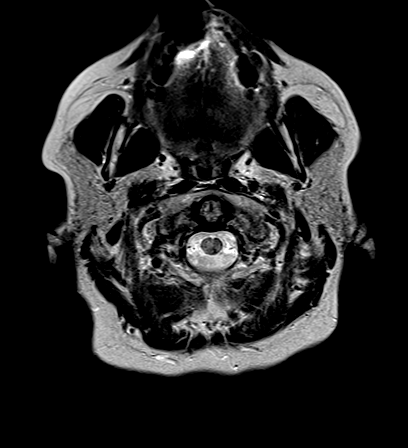
[im 12/24]
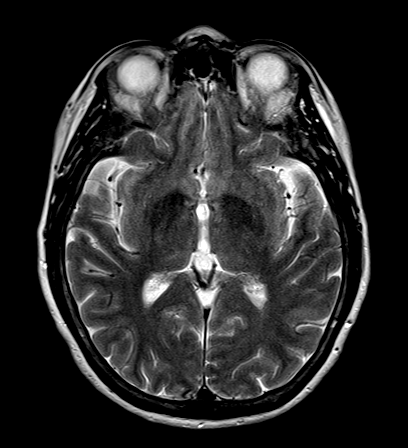
[im 24/24]
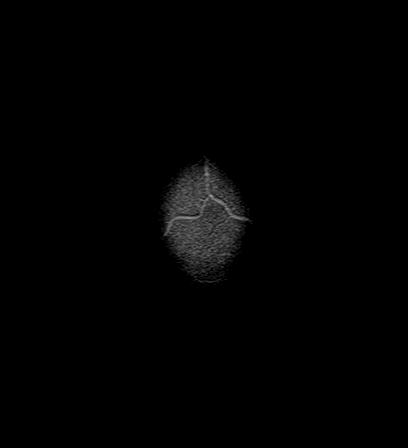

[Series 8: T2 · axial · 4.0mm · 0.40mm/px · z∈[-81,+63]mm · 4 of 30 slices shown (2 of 3)]
[im 1/30]
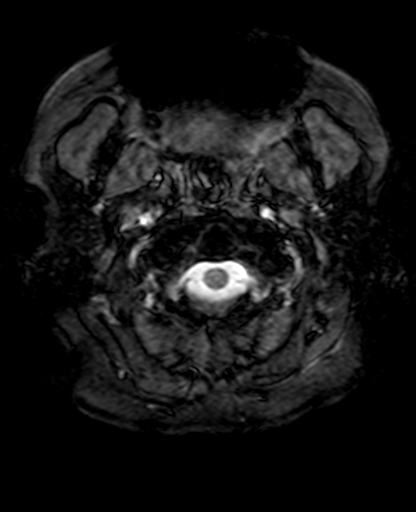
[im 10/30]
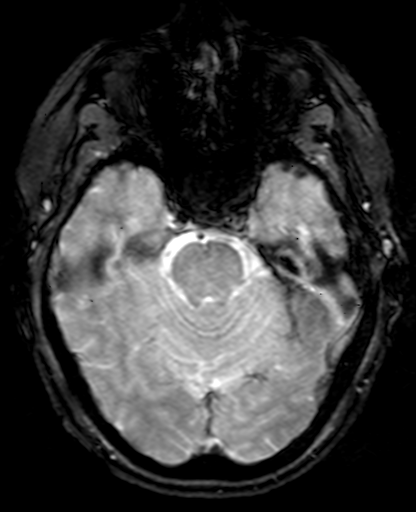
[im 20/30]
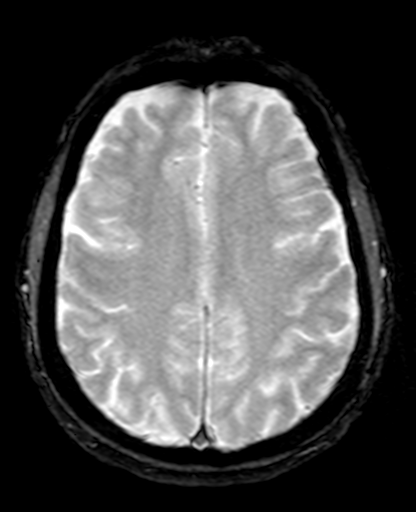
[im 30/30]
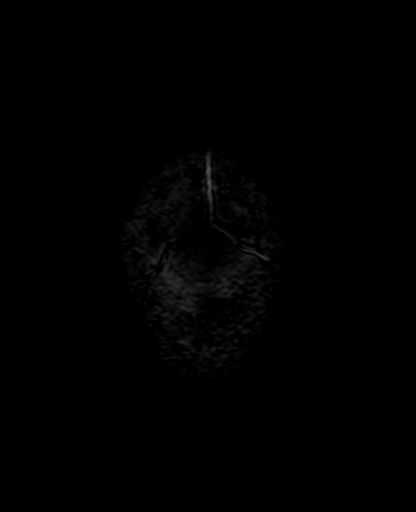

[Series 9: FLAIR · axial · 3.0mm · 0.31mm/px · z∈[-81,+62]mm · 6 of 49 slices shown]
[im 1/49]
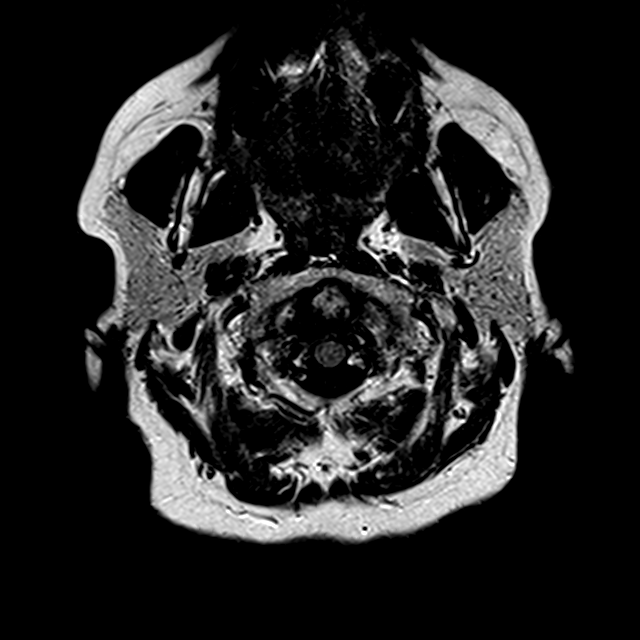
[im 10/49]
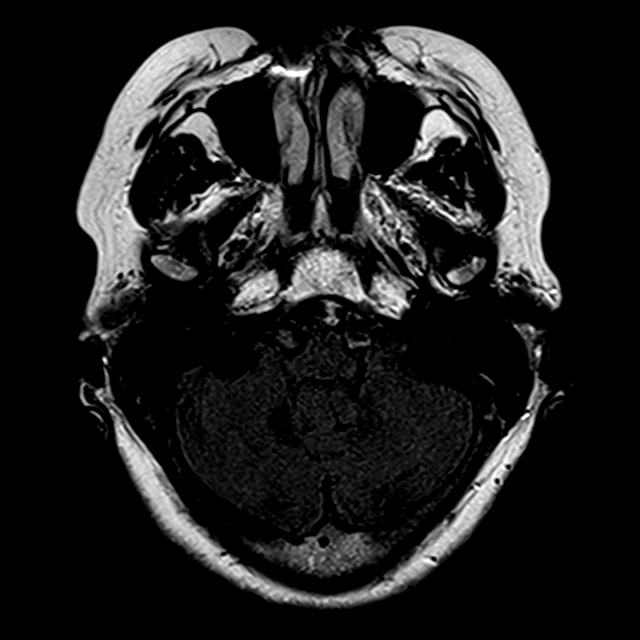
[im 20/49]
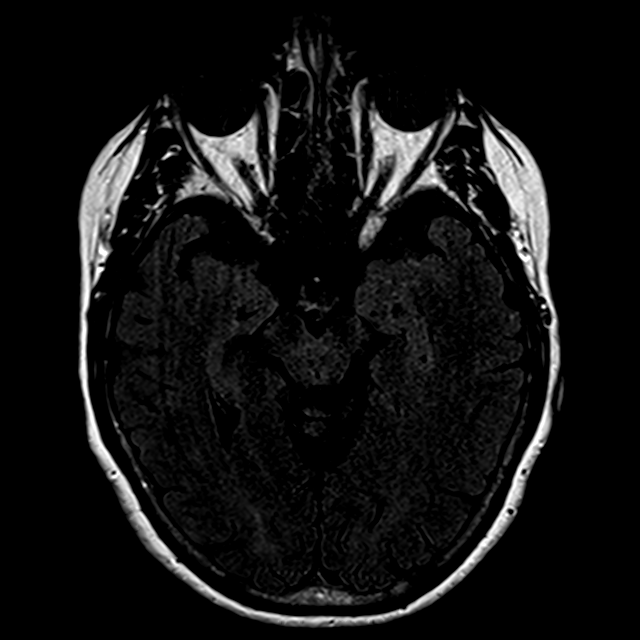
[im 29/49]
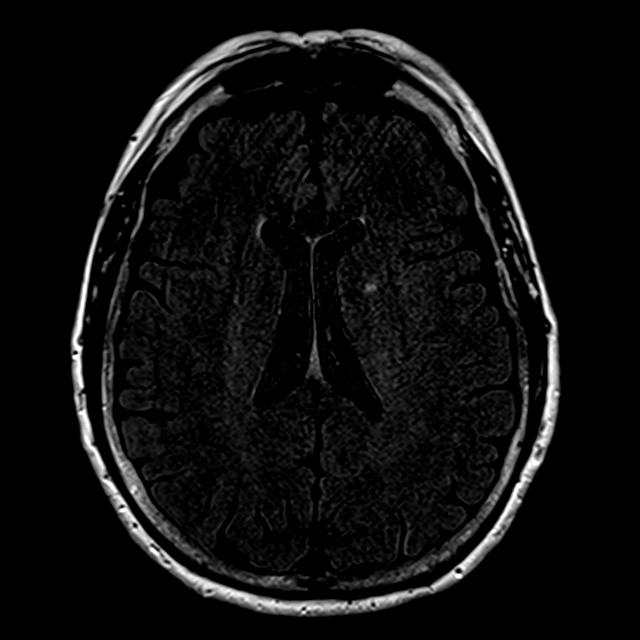
[im 39/49]
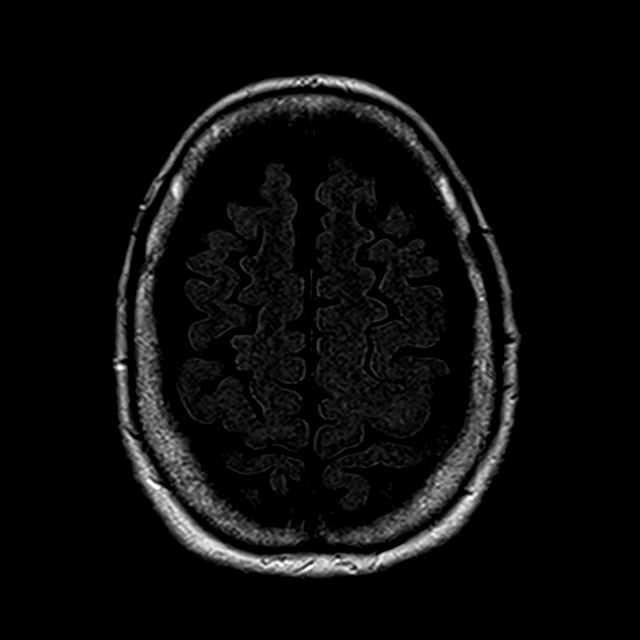
[im 49/49]
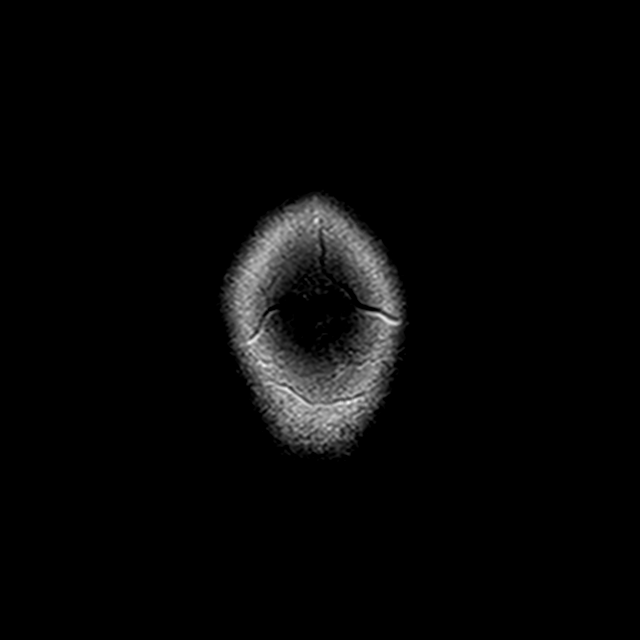

[Series 11: T2 · coronal · 5.0mm · 0.40mm/px · 2 of 28 slices shown (3 of 3)]
[im 1/28]
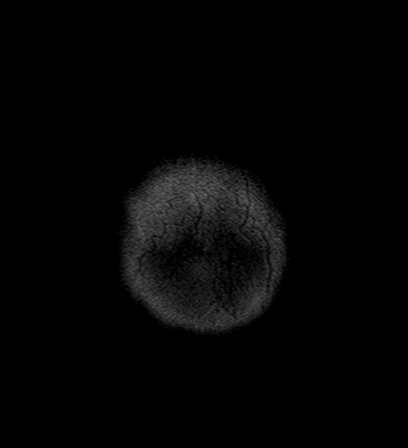
[im 14/28]
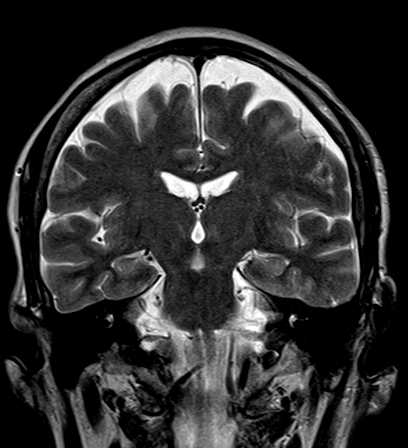

[25 of 48 positions shown; findings below may reference images not displayed]

FINDINGS: Brain: The brain does not show accelerated atrophy. The brainstem
and cerebellum are normal. Cerebral hemispheres are normal except
for few punctate foci of T2 and FLAIR signal in the white matter and
left basal ganglia, less than often seen at this age. No large
vessel territory infarction. No mass lesion, hemorrhage,
hydrocephalus or extra-axial collection.

Vascular: Major vessels at the base of the brain show flow.

Skull and upper cervical spine: Negative

Sinuses/Orbits: Clear/normal

Other: None
IMPRESSION: Normal exam for age. No acute or reversible finding. No specific
cause of the presenting symptoms is identified. Few punctate foci of
T2 and FLAIR signal in the white matter and left basal ganglia, less
than often seen at this age.

## 2019-05-08 ENCOUNTER — Encounter: Payer: Self-pay | Admitting: Family Medicine

## 2019-05-08 ENCOUNTER — Other Ambulatory Visit: Payer: Self-pay | Admitting: Family Medicine

## 2019-05-10 ENCOUNTER — Other Ambulatory Visit: Payer: Self-pay

## 2019-05-10 MED ORDER — CYANOCOBALAMIN 1000 MCG/ML IJ SOLN: INTRAMUSCULAR | 3 refills | Status: DC

## 2019-05-10 MED ORDER — "SYRINGE 25G X 1"" 3 ML MISC": 1.0000 | 1 refills | Status: DC

## 2019-05-21 DIAGNOSIS — M79671 Pain in right foot: Secondary | ICD-10-CM | POA: Diagnosis not present

## 2019-05-21 DIAGNOSIS — M722 Plantar fascial fibromatosis: Secondary | ICD-10-CM | POA: Diagnosis not present

## 2019-06-15 ENCOUNTER — Other Ambulatory Visit: Payer: Self-pay

## 2019-06-15 ENCOUNTER — Encounter: Payer: Self-pay | Admitting: Family Medicine

## 2019-06-15 ENCOUNTER — Ambulatory Visit (INDEPENDENT_AMBULATORY_CARE_PROVIDER_SITE_OTHER): Payer: Medicare Other | Admitting: Family Medicine

## 2019-06-15 VITALS — BP 182/102 | HR 76 | Temp 97.1°F | Ht 65.5 in | Wt 182.6 lb

## 2019-06-15 DIAGNOSIS — R739 Hyperglycemia, unspecified: Secondary | ICD-10-CM

## 2019-06-15 DIAGNOSIS — E559 Vitamin D deficiency, unspecified: Secondary | ICD-10-CM

## 2019-06-15 DIAGNOSIS — Z8639 Personal history of other endocrine, nutritional and metabolic disease: Secondary | ICD-10-CM | POA: Diagnosis not present

## 2019-06-15 DIAGNOSIS — R202 Paresthesia of skin: Secondary | ICD-10-CM

## 2019-06-15 DIAGNOSIS — R030 Elevated blood-pressure reading, without diagnosis of hypertension: Secondary | ICD-10-CM

## 2019-06-15 DIAGNOSIS — F430 Acute stress reaction: Secondary | ICD-10-CM | POA: Diagnosis not present

## 2019-06-15 DIAGNOSIS — E538 Deficiency of other specified B group vitamins: Secondary | ICD-10-CM | POA: Diagnosis not present

## 2019-06-15 DIAGNOSIS — R682 Dry mouth, unspecified: Secondary | ICD-10-CM

## 2019-06-15 LAB — COMPREHENSIVE METABOLIC PANEL
ALT: 15 U/L (ref 0–35)
AST: 15 U/L (ref 0–37)
Albumin: 4.4 g/dL (ref 3.5–5.2)
Alkaline Phosphatase: 60 U/L (ref 39–117)
BUN: 11 mg/dL (ref 6–23)
CO2: 27 mEq/L (ref 19–32)
Calcium: 9.9 mg/dL (ref 8.4–10.5)
Chloride: 110 mEq/L (ref 96–112)
Creatinine, Ser: 0.96 mg/dL (ref 0.40–1.20)
GFR: 56.96 mL/min — ABNORMAL LOW (ref >60.00)
Glucose, Bld: 96 mg/dL (ref 70–99)
Potassium: 4.3 mEq/L (ref 3.5–5.1)
Sodium: 144 mEq/L (ref 135–145)
Total Bilirubin: 1 mg/dL (ref 0.2–1.2)
Total Protein: 6.8 g/dL (ref 6.0–8.3)

## 2019-06-15 LAB — POCT GLYCOSYLATED HEMOGLOBIN (HGB A1C): Hemoglobin A1C: 5.6 % (ref 4.0–5.6)

## 2019-06-15 LAB — VITAMIN D 25 HYDROXY (VIT D DEFICIENCY, FRACTURES): VITD: 48.06 ng/mL (ref 30.00–100.00)

## 2019-06-15 LAB — B12 AND FOLATE PANEL
Folate: 11.3 ng/mL (ref >5.9)
Vitamin B-12: 782 pg/mL (ref 211–911)

## 2019-06-15 LAB — SEDIMENTATION RATE: Sed Rate: 23 mm/hr (ref 0–30)

## 2019-06-15 NOTE — Patient Instructions (Addendum)
Please return in 3 months for recheck.   Please start otc zyrtec once daily. Please increase your gabapentin dose; can go up to 600mg  3x/day if tolerated but can go slowly by adding 100mg  tablets at a time.   I will release your lab results to you on your MyChart account with further instructions. Please reply with any questions.   Please check your blood pressure readings over the next week at different times. IF it is > 140/90, set up a virtual visit with me to discuss. IF it is > 200/100, set up an immediate OV.   If you have any questions or concerns, please don't hesitate to send me a message via MyChart or call the office at [phone]. Thank you for visiting with [place] today! It's our pleasure caring for you.

## 2019-06-15 NOTE — Progress Notes (Signed)
Subjective  CC:  Chief Complaint  Patient presents with  . Hypothyroidism    Takes Synthroid daily. no side effects  . Gastroesophageal Reflux    no new problems while taking omeprazole  . Hyperlipidemia    Diet is slighty healthy, takes rosuvastatin  . B12 deficiency    b12 injections at home monthly    HPI: Latasha Mclaughlin is a 74 y.o. female who presents to the office today to address the problems listed above in the chief complaint.  F/u for chronic problems and persistent paresthesias/burning skin sensations w/o clear cause: I reviewed multiple old records including all neurology notes, MRI brain/lumbar, EMG/ncv, EGD report and lab results.   Pt has persistent burning sensation in skin, left abdomen, feet and hands. Also w/ numbness tingling at times. Started last march after running low grade fever x 3 weeks. Has had extensive work up by neuro, SM and GI: no etiology found. Neg radiculopathy, neuropathy or lab abnormalities noted outside of vit B12 deficiency. She is frustrated. Reports sxs worsen after "anything I put in my body": like eating or B12 injections or vit D pills. No rash. New sx of dry mouth now. No arthralgias.   H/o hyperparathyroidism s/p surgery.   B12: due for recheck after 3 months of injections. No change in sxs  Gabapentin does help sxs but does not control them  Very stressed this am: freezing rain and long drive are the culprits. However, ? Stressors since coronavirus pandemic isolation. Pt doesn't think it has bothered her much but admits to life changing 100%; she used to "go" all the time.    No h/o HTN but very elevated readings to day in office. Pt appears anxious  Thyroid levels are at goal on meds.   Lab Results  Component Value Date   TSH 0.87 03/16/2019   Lab Results  Component Value Date   HGBA1C 5.6 06/15/2019   HGBA1C 5.9 10/18/2018    Lab Results  Component Value Date   CREATININE 0.87 01/23/2019   BUN 11 01/23/2019   NA 141  01/23/2019   K 4.2 01/23/2019   CL 106 01/23/2019   CO2 28 01/23/2019   Lab Results  Component Value Date   ESRSEDRATE 5 01/05/2017   Lab Results  Component Value Date   VITAMINB12 344 01/16/2019    Assessment  1. Paresthesia   2. Vitamin B12 deficiency   3. History of hyperparathyroidism   4. Vitamin D deficiency   5. Elevated blood pressure reading without diagnosis of hypertension   6. Stress reaction   7. Hyperglycemia   8. Dry mouth      Plan   Pareshtesia/skin sensations:  Very unclear cause however work up to date is thorough and negative. Reassured. Try to increase gabapentin dose. ? Allergy: start zyrtec. Defer further referral now given covid. Close f/u.  Check labs again for b12  Check sjogren given new dry mouth  Discussed stress and HTN response. Pt to monitor at home.   Discussed link between stress/life changes and sxs  Recheck 3 months.   Follow up: Return in about 3 months (around 09/12/2019) for recheck.  Visit date not found  Orders Placed This Encounter  Procedures  . PTH, Intact and Calcium  . B12 and Folate Panel  . VITAMIN D 25 Hydroxy (Vit-D Deficiency, Fractures)  . Comprehensive metabolic panel  . Sedimentation rate  . Sjogren's syndrome antibods(ssa + ssb)  . POCT glycosylated hemoglobin (Hb A1C)   No  orders of the defined types were placed in this encounter.     I reviewed the patients updated PMH, FH, and SocHx.    Patient Active Problem List   Diagnosis Date Noted  . Colon polyp 11/18/2017    Priority: High  . Hypothyroidism 12/15/2016    Priority: High  . Mixed hyperlipidemia 12/15/2016    Priority: High  . Osteopenia after menopause 03/22/2019    Priority: Medium  . GERD (gastroesophageal reflux disease) 03/16/2019    Priority: Medium  . Hiatal hernia 03/16/2019    Priority: Medium  . Vitamin B12 deficiency 10/20/2018    Priority: Low  . History of hyperparathyroidism 01/08/2017    Priority: Low  . Paresthesia  01/05/2017    Priority: Low   Current Meds  Medication Sig  . azelastine (ASTELIN) 0.1 % nasal spray Place 1 spray into both nostrils 2 (two) times daily.  . carboxymethylcellulose 1 % ophthalmic solution Apply 1 drop to eye 3 (three) times daily.  . Cholecalciferol (VITAMIN D) 50 MCG (2000 UT) tablet Take 2,000 Units by mouth daily.  . cyanocobalamin (,VITAMIN B-12,) 1000 MCG/ML injection ADMINISTER 1 ML(1000 MCG) IN THE MUSCLE EVERY 30 DAYS  . Diphenhyd-Hydrocort-Nystatin (FIRST-DUKES MOUTHWASH) SUSP 5 ML SWISH AND SPIT QID X 10 DAYS  . gabapentin (NEURONTIN) 300 MG capsule Take 1 capsule (300 mg total) by mouth 3 (three) times daily.  Marland Kitchen levothyroxine (SYNTHROID) 100 MCG tablet Take 1 every third day in a 3 day cycle. BRAND NAME SYNTHROID  . levothyroxine (SYNTHROID) 88 MCG tablet Take 1 tablet by every first and second day in a 3 day cycle for 90 days. BRAND NAME SYNTHROID  . omeprazole (PRILOSEC) 40 MG capsule One daily in the morning before breakfast  . rosuvastatin (CRESTOR) 10 MG tablet Take 1 tablet (10 mg total) by mouth daily.  . Syringe/Needle, Disp, (SYRINGE 3CC/25GX1") 25G X 1" 3 ML MISC 1 each by Does not apply route every 30 (thirty) days.  Marland Kitchen thiamine (VITAMIN B-1) 100 MG tablet Take 100 mg by mouth daily.  . [DISCONTINUED] Cyanocobalamin (VITAMIN B-12 IJ) Inject as directed every 30 (thirty) days.    Allergies: Patient is allergic to prevacid [lansoprazole] and doxycycline. Family History: Patient family history includes Cancer in her father; Colon polyps in her father and sister; Congestive Heart Failure in her mother; Diabetes in her father and son; Drug abuse in her brother; Heart disease in her father and mother; Hyperlipidemia in her father and mother; Hypertension in her mother; Thyroid disease in her sister. Social History:  Patient  reports that she has quit smoking. She has never used smokeless tobacco. She reports that she does not drink alcohol or use  drugs.  Review of Systems: Constitutional: Negative for fever malaise or anorexia Cardiovascular: negative for chest pain Respiratory: negative for SOB or persistent cough Gastrointestinal: negative for abdominal pain  Objective  Vitals: BP (!) 182/102 (BP Location: Left Arm, Patient Position: Sitting, Cuff Size: Normal)   Pulse 76   Temp (!) 97.1 F (36.2 C) (Temporal)   Ht 5' 5.5" (1.664 m)   Wt 182 lb 9.6 oz (82.8 kg)   SpO2 97%   BMI 29.92 kg/m  General: no acute distress but tearful and anxious at times , A&Ox3 HEENT: PEERL,  Cardiovascular:  RRR without murmur or gallop.  Respiratory:  Good breath sounds bilaterally, CTAB with normal respiratory effort Skin:  Warm, no rashes     Commons side effects, risks, benefits, and alternatives for medications and  treatment plan prescribed today were discussed, and the patient expressed understanding of the given instructions. Patient is instructed to call or message via MyChart if he/she has any questions or concerns regarding our treatment plan. No barriers to understanding were identified. We discussed Red Flag symptoms and signs in detail. Patient expressed understanding regarding what to do in case of urgent or emergency type symptoms.   Medication list was reconciled, printed and provided to the patient in AVS. Patient instructions and summary information was reviewed with the patient as documented in the AVS. This note was prepared with assistance of Dragon voice recognition software. Occasional wrong-word or sound-a-like substitutions may have occurred due to the inherent limitations of voice recognition software  This visit occurred during the SARS-CoV-2 public health emergency.  Safety protocols were in place, including screening questions prior to the visit, additional usage of staff PPE, and extensive cleaning of exam room while observing appropriate contact time as indicated for disinfecting solutions.

## 2019-06-16 ENCOUNTER — Encounter: Payer: Self-pay | Admitting: Family Medicine

## 2019-06-18 ENCOUNTER — Ambulatory Visit: Payer: Medicare Other | Admitting: Family Medicine

## 2019-06-18 LAB — SJOGREN'S SYNDROME ANTIBODS(SSA + SSB)
SSA (Ro) (ENA) Antibody, IgG: <1 AI
SSB (La) (ENA) Antibody, IgG: <1 AI

## 2019-06-18 LAB — EXTRA SPECIMEN

## 2019-06-18 LAB — PTH, INTACT AND CALCIUM
Calcium: 10.3 mg/dL (ref 8.6–10.4)
PTH: 49 pg/mL (ref 14–64)

## 2019-06-22 ENCOUNTER — Ambulatory Visit: Payer: Medicare Other | Admitting: Family Medicine

## 2019-06-25 ENCOUNTER — Encounter: Payer: Self-pay | Admitting: Family Medicine

## 2019-07-13 ENCOUNTER — Telehealth: Payer: Medicare Other | Admitting: Neurology

## 2019-07-20 ENCOUNTER — Telehealth: Payer: Self-pay | Admitting: Family Medicine

## 2019-07-20 NOTE — Telephone Encounter (Signed)
Would reschedule for  a few weeks from now due to possible illness. Thanks.

## 2019-07-20 NOTE — Telephone Encounter (Signed)
Called let patient know she will call to reschedule. Will call office if any questions.

## 2019-07-20 NOTE — Telephone Encounter (Signed)
Please advise 

## 2019-07-20 NOTE — Telephone Encounter (Signed)
Patient has an appointment at [facility] for a COVID vac at 4pm.  Patient is having significant ear pain, runny nose and scratchy throat.  Denies fever.  Would likes to know if Dr. Jonni Sanger advises her to get the COVID vac this afternoon or hold off?

## 2019-09-12 ENCOUNTER — Encounter: Payer: Self-pay | Admitting: Family Medicine

## 2019-09-12 ENCOUNTER — Ambulatory Visit (INDEPENDENT_AMBULATORY_CARE_PROVIDER_SITE_OTHER): Payer: Medicare Other | Admitting: Family Medicine

## 2019-09-12 ENCOUNTER — Other Ambulatory Visit: Payer: Self-pay

## 2019-09-12 VITALS — BP 126/78 | HR 77 | Temp 97.2°F | Resp 15 | Ht 66.0 in | Wt 183.0 lb

## 2019-09-12 DIAGNOSIS — R202 Paresthesia of skin: Secondary | ICD-10-CM | POA: Diagnosis not present

## 2019-09-12 DIAGNOSIS — R030 Elevated blood-pressure reading, without diagnosis of hypertension: Secondary | ICD-10-CM

## 2019-09-12 DIAGNOSIS — M792 Neuralgia and neuritis, unspecified: Secondary | ICD-10-CM

## 2019-09-12 DIAGNOSIS — K219 Gastro-esophageal reflux disease without esophagitis: Secondary | ICD-10-CM

## 2019-09-12 MED ORDER — GABAPENTIN 300 MG PO CAPS: ORAL_CAPSULE | ORAL | 5 refills | Status: DC

## 2019-09-12 MED ORDER — TRAMADOL HCL 50 MG PO TABS: 50.0000 mg | ORAL_TABLET | Freq: Four times a day (QID) | ORAL | 0 refills | Status: DC | PRN

## 2019-09-12 NOTE — Progress Notes (Signed)
Subjective  CC:  Chief Complaint  Patient presents with  . Hypothyroidism  . Elevated blood pressure reading without diagnosis of hyperte    126/68 at home this morning   . Gastroesophageal Reflux  . parathesia    increased Gabapentin 600mg  3x daily at last visit, patient states that she did not feel steady on feet and "foggy brain," has gone back to 300 mg 3x daily    HPI: Latasha Mclaughlin is a 74 y.o. female who presents to the office today to address the problems listed above in the chief complaint.  F/u paresthesias to feet and burning pain over left upper abdomen with complete and neg work up to date: see last note. On gabapentin: tried increasing dose but she increased the day time doses first and felt unsteady. Sleeps well. No changes in sxs. At times, burning pain over upper left abd wall is painful.   No h/o HTN: home readings all remain consistently normal.   GERD sxs are controlled on high dose PPI.   Assessment  1. Paresthesia   2. Elevated blood pressure reading without diagnosis of hypertension   3. Neuropathic pain   4. Gastroesophageal reflux disease, unspecified whether esophagitis present      Plan   Paresthesias and presumed neuropathic pain:  Counseled. Unknown diagnosis but could be secondary sxs after viral illness. For now, increase nighttime dose of gabapentin and add tramadol for prn use for pain. Reassured.   Normal home bp's with elevated readings here but she is stressed. Continue to monitor at home.   Continue PPI. b12 levels are normal on supplements.   Follow up: Return in about 6 months (around 03/14/2020) for complete physical.  Visit date not found  No orders of the defined types were placed in this encounter.  Meds ordered this encounter  Medications  . gabapentin (NEURONTIN) 300 MG capsule    Sig: Take 2 capsules (600 mg total) by mouth at bedtime AND 1 capsule (300 mg total) 2 (two) times daily.    Dispense:  270 capsule    Refill:   5  . traMADol (ULTRAM) 50 MG tablet    Sig: Take 1 tablet (50 mg total) by mouth every 6 (six) hours as needed for moderate pain.    Dispense:  30 tablet    Refill:  0      I reviewed the patients updated PMH, FH, and SocHx.    Patient Active Problem List   Diagnosis Date Noted  . Colon polyp 11/18/2017    Priority: High  . Hypothyroidism 12/15/2016    Priority: High  . Mixed hyperlipidemia 12/15/2016    Priority: High  . Osteopenia after menopause 03/22/2019    Priority: Medium  . GERD (gastroesophageal reflux disease) 03/16/2019    Priority: Medium  . Hiatal hernia 03/16/2019    Priority: Medium  . Vitamin B12 deficiency 10/20/2018    Priority: Low  . History of hyperparathyroidism 01/08/2017    Priority: Low  . Paresthesia 01/05/2017    Priority: Low  . Neuropathic pain 09/12/2019   Current Meds  Medication Sig  . azelastine (ASTELIN) 0.1 % nasal spray Place 1 spray into both nostrils 2 (two) times daily.  . Cholecalciferol (VITAMIN D) 50 MCG (2000 UT) tablet Take 2,000 Units by mouth daily.  . cyanocobalamin (,VITAMIN B-12,) 1000 MCG/ML injection ADMINISTER 1 ML(1000 MCG) IN THE MUSCLE EVERY 30 DAYS  . gabapentin (NEURONTIN) 300 MG capsule Take 2 capsules (600 mg total) by mouth  at bedtime AND 1 capsule (300 mg total) 2 (two) times daily.  Marland Kitchen levothyroxine (SYNTHROID) 100 MCG tablet Take 1 every third day in a 3 day cycle. BRAND NAME SYNTHROID  . levothyroxine (SYNTHROID) 88 MCG tablet Take 1 tablet by every first and second day in a 3 day cycle for 90 days. BRAND NAME SYNTHROID  . omeprazole (PRILOSEC) 40 MG capsule One daily in the morning before breakfast  . rosuvastatin (CRESTOR) 10 MG tablet Take 1 tablet (10 mg total) by mouth daily.  . Syringe/Needle, Disp, (SYRINGE 3CC/25GX1") 25G X 1" 3 ML MISC 1 each by Does not apply route every 30 (thirty) days.  Marland Kitchen thiamine (VITAMIN B-1) 100 MG tablet Take 100 mg by mouth daily.  . [DISCONTINUED] gabapentin (NEURONTIN) 300  MG capsule Take 1 capsule (300 mg total) by mouth 3 (three) times daily.    Allergies: Patient is allergic to prevacid [lansoprazole] and doxycycline. Family History: Patient family history includes Cancer in her father; Colon polyps in her father and sister; Congestive Heart Failure in her mother; Diabetes in her father and son; Drug abuse in her brother; Heart disease in her father and mother; Hyperlipidemia in her father and mother; Hypertension in her mother; Thyroid disease in her sister. Social History:  Patient  reports that she has quit smoking. She has never used smokeless tobacco. She reports that she does not drink alcohol or use drugs.  Review of Systems: Constitutional: Negative for fever malaise or anorexia Cardiovascular: negative for chest pain Respiratory: negative for SOB or persistent cough Gastrointestinal: negative for abdominal pain  Objective  Vitals: BP 126/78 Comment: by home reading this am  Pulse 77   Temp (!) 97.2 F (36.2 C) (Temporal)   Resp 15   Ht 5\' 6"  (1.676 m)   Wt 183 lb (83 kg)   SpO2 98%   BMI 29.54 kg/m  General: no acute distress , A&Ox3 Cardiovascular:  RRR without murmur or gallop.  Respiratory:  Good breath sounds bilaterally, CTAB with normal respiratory effort Skin:  Warm, no rashes     Commons side effects, risks, benefits, and alternatives for medications and treatment plan prescribed today were discussed, and the patient expressed understanding of the given instructions. Patient is instructed to call or message via MyChart if he/she has any questions or concerns regarding our treatment plan. No barriers to understanding were identified. We discussed Red Flag symptoms and signs in detail. Patient expressed understanding regarding what to do in case of urgent or emergency type symptoms.   Medication list was reconciled, printed and provided to the patient in AVS. Patient instructions and summary information was reviewed with the  patient as documented in the AVS. This note was prepared with assistance of Dragon voice recognition software. Occasional wrong-word or sound-a-like substitutions may have occurred due to the inherent limitations of voice recognition software  This visit occurred during the SARS-CoV-2 public health emergency.  Safety protocols were in place, including screening questions prior to the visit, additional usage of staff PPE, and extensive cleaning of exam room while observing appropriate contact time as indicated for disinfecting solutions.

## 2019-09-12 NOTE — Patient Instructions (Signed)
Please return in 6 months for your annual complete physical; please come fasting.  Try increasing the gabapentin slowly at nighttime. Start with 400mg  at night and see if you can tolerate up to 600mg  at night. Keep the day time doses at 300mg . This may help your presumed neuropathic pain.  You may try the tramadol pain medication to treat pain if needed.   Please keep track of your blood pressures at home and bring in your BP cuff to your next appointment.  Let me know if you need anything.   If you have any questions or concerns, please don't hesitate to send me a message via MyChart or call the office at [phone]. Thank you for visiting with [place] today! It's our pleasure caring for you.

## 2019-10-26 ENCOUNTER — Encounter: Payer: Self-pay | Admitting: Physical Medicine and Rehabilitation

## 2019-10-26 NOTE — Progress Notes (Signed)
Latasha Mclaughlin - 74 y.o. female MRN [id number]  Date of birth: [birth year]  Office Visit Note: Visit Date: 11/16/2018 PCP: Leamon Arnt, MD Referred by: Briscoe Deutscher, DO  Subjective: Chief Complaint  Patient presents with  . Middle Back - Pain  . Right Lower Leg - Pain  . Left Lower Leg - Pain   HPI: Latasha Mclaughlin is a 74 y.o. female who comes in today For new patient evaluation and management at the request of Dr. Briscoe Deutscher.  Patient reports pain across the upper and middle back and abdomen.  This is been a chronic ongoing since ablation since April.  Original referral to [place] was for back pain and this was put in in May and it looks like we had a hard time getting in touch with the patient but now are seeing her in July.  She reports average pain is a 7 out of 10 is constant and burning pain.  Since the time the referral was placed she reported paresthesias and burning sensations really in all quadrants upper back and arms as well as legs.  She actually has an appointment upcoming with Dr. Narda Amber at [facility] neurology.  She has not seen a neurologist prior to this.  She reports to me pain really in the upper thoracic area it does seem to be worse with some activity. Patient continues to have pain that starts in her epigastrium and radiates to her pain and up between her shoulder blades. Associated with feeling of hot flashes. No medications have helped except for Gabapentin, currently at 200 mg in am, 100 mg at noon, 100 mg at night. Working her way up to 200 mg TID.  I did review all of Dr. Lutricia Horsfall notes which amounted to several pages.  It appears that the patient did have CT of the abdomen and pelvis and this showed large hiatal hernia but otherwise was unremarkable.  She reports that she had a telephone or telemedicine visit with Dr. Fuller Plan and from a gastroenterology perspective they did not think the pain was coming from the hiatal hernia.  The patient does have irritable  bowel syndrome and they felt like that may be the source.  She did not really take the medication prescribed for IBS.  She actually stopped all of her medication for gastric reflux and has not really noticed any difference from a pain standpoint.  Gabapentin does seem to help in general.  She has a prior history of ACDF at C5-6.  She has no recent imaging and we did obtain images of the cervical and thoracic spine today.  She does carry a history of osteoporosis and osteopenia without fracture.  X-rays today did not show any fractures.  She does have spondylitic change throughout.  She is not getting any radicular type pain down the arms or around the ribs.  She does have this generalized burning sensation.  She has had no focal weakness.  She has had no unexplained weight loss or specific trauma.  Review of Systems  Constitutional: Negative for chills, fever, malaise/fatigue and weight loss.  HENT: Negative for hearing loss and sinus pain.   Eyes: Negative for blurred vision, double vision and photophobia.  Respiratory: Negative for cough and shortness of breath.   Cardiovascular: Negative for chest pain, palpitations and leg swelling.  Gastrointestinal: Positive for abdominal pain. Negative for nausea and vomiting.  Genitourinary: Negative for flank pain.  Musculoskeletal: Positive for back pain, joint pain and neck pain.  Negative for myalgias.  Skin: Negative for itching and rash.  Neurological: Positive for tingling. Negative for tremors, focal weakness and weakness.  Endo/Heme/Allergies: Negative.   Psychiatric/Behavioral: Negative for depression.  All other systems reviewed and are negative.  Otherwise per HPI.  Assessment & Plan: Visit Diagnoses:  1. Acute bilateral low back pain with bilateral sciatica   2. Pain in thoracic spine   3. Spine pain, cervical     Plan: Findings:  Chronic worsening generalized upper back pain with more generalized sensation changes throughout the upper  and lower body which are more of a burning sensation that seem to be only helped with gabapentin.  She has gotten the gabapentin up to 300 mg 3 times a day.  She does have an upcoming consultation with Dr. Narda Amber from a neurology perspective.  I was able to look at the prior CT scan of the abdomen and pelvis and did not see any worrisome signs there.  At this point this does not seem to be a spine related matter given the imaging and her clinical complaints although I cannot totally rule it out.  I think right now we would wait to hear what Dr. Posey Pronto thought from a neurology standpoint whether electrodiagnostic studies are performed.  The next step in this likely would be continued physical therapy and medication management with gabapentin.  Is seems to be more myofascial type pain and I think therapy continuation would be great.  If it were still bothersome and there is no other explanation obviously thoracic spine and/or cervical spine MRI could be performed.  She does have a prior history of cervical fusion.  Also on the diagnosis of central pain sensitization type syndrome such as fibromyalgia.  She does not carry that diagnosis.    Meds & Orders: No orders of the defined types were placed in this encounter.   Orders Placed This Encounter  Procedures  . XR Thoracic Spine 2 View  . XR Cervical Spine 2 or 3 views    Follow-up: Return if symptoms worsen or fail to improve.   Procedures: No procedures performed  No notes on file   Clinical History: No specialty comments available.   She reports that she has quit smoking. She has never used smokeless tobacco.  Recent Labs    06/15/19 0922  HGBA1C 5.6    Objective:  VS:  HT:5\' 6"  (167.6 cm)   WT:175 lb (79.4 kg)  BMI:28.26    BP:137/80  HR:61bpm  TEMP: ( )  RESP:  Physical Exam Vitals and nursing note reviewed.  Constitutional:      General: She is not in acute distress.    Appearance: Normal appearance. She is not  ill-appearing.  HENT:     Head: Normocephalic and atraumatic.     Right Ear: External ear normal.     Left Ear: External ear normal.  Eyes:     Extraocular Movements: Extraocular movements intact.  Cardiovascular:     Rate and Rhythm: Normal rate.     Pulses: Normal pulses.  Musculoskeletal:     Cervical back: Neck supple. Tenderness present. No rigidity.     Right lower leg: No edema.     Left lower leg: No edema.     Comments: Patient has good strength in the upper extremities including 5 out of 5 strength in wrist extension long finger flexion and APB.  There is no atrophy of the hands intrinsically.  There is a negative Hoffmann's test.  She has  a forward flexed cervical spine.  She has pain with trigger points along the paraspinal musculature throughout the cervical and thoracic spine with taut bands and she also has trigger points along the rhomboids and infraspinatus.  No shoulder impingement.  She has good strength in lower extremities without clonus.   Lymphadenopathy:     Cervical: No cervical adenopathy.  Skin:    Findings: No erythema, lesion or rash.  Neurological:     General: No focal deficit present.     Mental Status: She is alert and oriented to person, place, and time.     Sensory: No sensory deficit.     Motor: No weakness or abnormal muscle tone.     Coordination: Coordination normal.  Psychiatric:        Mood and Affect: Mood normal.        Behavior: Behavior normal.     Ortho Exam  Imaging: AP and lateral x-ray thoracic spine: AP and lateral thoracic spine shows increased kyphosis without vertebral  body fracture but there is anterior bridging osteophytes and degenerative  disc changes from about T7-T9 but otherwise normal anatomic alignment.   Heart silhouette is a little bit increased but lungs are clear without  pneumonia.  AP and lateral cervical spine: AP and lateral of the cervical spine shows normal anatomic alignment with  prior ACDF at C5-6  with adjacent level disease by foraminal narrowing at  C4-5 and some disc degeneration and anterior spurring at C6-7  Past Medical/Family/Surgical/Social History: Medications & Allergies reviewed per EMR, new medications updated. Patient Active Problem List   Diagnosis Date Noted  . Neuropathic pain 09/12/2019  . Osteopenia after menopause 03/22/2019  . GERD (gastroesophageal reflux disease) 03/16/2019  . Hiatal hernia 03/16/2019  . Vitamin B12 deficiency 10/20/2018  . Colon polyp 11/18/2017  . History of hyperparathyroidism 01/08/2017  . Paresthesia 01/05/2017  . Hypothyroidism 12/15/2016  . Mixed hyperlipidemia 12/15/2016   Past Medical History:  Diagnosis Date  . Allergy    SEASONAL  . Arthritis   . Back pain   . GERD (gastroesophageal reflux disease)   . Hiatal hernia 03/16/2019  . History of tonsillectomy   . Hyperlipidemia 12/15/2016  . Hypothyroidism 12/15/2016  . Osteopenia after menopause 03/22/2019   dexa 03/2019: T=-2.1 hip; Lspine excluded.   . Osteoporosis   . Sinus infection    Family History  Problem Relation Age of Onset  . Hyperlipidemia Mother   . Hypertension Mother   . Congestive Heart Failure Mother   . Heart disease Mother   . Cancer Father   . Diabetes Father   . Heart disease Father   . Hyperlipidemia Father   . Colon polyps Father   . Thyroid disease Sister   . Colon polyps Sister   . Drug abuse Brother   . Diabetes Son   . Colon cancer Neg Hx   . Esophageal cancer Neg Hx   . Stomach cancer Neg Hx   . Rectal cancer Neg Hx    Past Surgical History:  Procedure Laterality Date  . ADENOIDECTOMY    . ANTERIOR CERVICAL DECOMP/DISCECTOMY FUSION  2002  . COLONOSCOPY  10/06/2018  . DILATION AND CURETTAGE OF UTERUS     X2  . LEFT OOPHORECTOMY    . PARATHYROIDECTOMY    . POLYPECTOMY    . SHOULDER SURGERY    . TONSILLECTOMY     Social History   Occupational History  . Occupation: retired  Tobacco Use  . Smoking status: Former  Smoker    . Smokeless tobacco: Never Used  . Tobacco comment: Quit in 1985  Vaping Use  . Vaping Use: Never used  Substance and Sexual Activity  . Alcohol use: No  . Drug use: No  . Sexual activity: Yes

## 2019-11-19 ENCOUNTER — Encounter: Payer: Self-pay | Admitting: Family Medicine

## 2019-11-19 DIAGNOSIS — E538 Deficiency of other specified B group vitamins: Secondary | ICD-10-CM

## 2019-11-19 MED ORDER — CYANOCOBALAMIN 1000 MCG/ML IJ SOLN: INTRAMUSCULAR | 3 refills | Status: DC

## 2019-11-19 NOTE — Telephone Encounter (Signed)
Yes, please refill B12 injections. Please reply this will be a longterm medication, either by injection or by oral pills. For now, let's keep with the monthly injections.

## 2019-11-28 NOTE — Progress Notes (Signed)
Follow-up Visit   Date: 11/29/19   Latasha Mclaughlin MRN: [id number] DOB: 10/02/1945   Interim History: Latasha Mclaughlin is a 74 y.o. female with vitamin B12 deficiency, GERD, hypothyroidism, and hyperlipidemia returning to the clinic for follow-up of bilateral leg numbness.  The patient was accompanied to the clinic by self.  She continues to have ongoing numbness from the knees down into the feet for the past year.  Prior evaluation it included NCS/EMG of the legs in August 2020 which showed bilateral L4 radiculopathy, mild.  There was no evidence of neuropathy.  MRI of the lumbar spine did not show any significant stenosis to explain symptoms. There was note of a disc protrusion at T11-12.  Because of complaints of intermittent hand paresthesias, she also underwent MRI brain which did not show demyelinating disease.  At her last visit in December 2020, we discussed seeking second opinion due to lack of diagnosis.  Today, she reports numbness from the knees down into her feet.  She also has tight rubber band sensation around the arch of her feet.  She occasionally has burning pain in the feet and over the left abdomen. She was taking gabapentin 300mg  TID but due to no improvement, she reduced it to gabapentin 300mg  twice daily for the past week.    Medications:  Current Outpatient Medications on File Prior to Visit  Medication Sig Dispense Refill  . Cholecalciferol (VITAMIN D) 50 MCG (2000 UT) tablet Take 2,000 Units by mouth daily.    . cyanocobalamin (,VITAMIN B-12,) 1000 MCG/ML injection ADMINISTER 1 ML(1000 MCG) IN THE MUSCLE EVERY 30 DAYS 1 mL 3  . gabapentin (NEURONTIN) 300 MG capsule Take 2 capsules (600 mg total) by mouth at bedtime AND 1 capsule (300 mg total) 2 (two) times daily. 270 capsule 5  . levothyroxine (SYNTHROID) 100 MCG tablet Take 1 every third day in a 3 day cycle. BRAND NAME SYNTHROID 36 tablet 3  . levothyroxine (SYNTHROID) 88 MCG tablet Take 1 tablet by every first  and second day in a 3 day cycle for 90 days. BRAND NAME SYNTHROID 60 tablet 3  . omeprazole (PRILOSEC) 40 MG capsule One daily in the morning before breakfast 30 capsule 11  . rosuvastatin (CRESTOR) 10 MG tablet Take 1 tablet (10 mg total) by mouth daily. 90 tablet 3  . thiamine (VITAMIN B-1) 100 MG tablet Take 100 mg by mouth daily.    . traMADol (ULTRAM) 50 MG tablet Take 1 tablet (50 mg total) by mouth every 6 (six) hours as needed for moderate pain. 30 tablet 0  . azelastine (ASTELIN) 0.1 % nasal spray Place 1 spray into both nostrils 2 (two) times daily. (Patient not taking: Reported on 11/29/2019) 30 mL 11  . carboxymethylcellulose 1 % ophthalmic solution Apply 1 drop to eye 3 (three) times daily. (Patient not taking: Reported on 09/12/2019) 30 mL 12  . Diphenhyd-Hydrocort-Nystatin (FIRST-DUKES MOUTHWASH) SUSP 5 ML SWISH AND SPIT QID X 10 DAYS (Patient not taking: Reported on 09/12/2019) 237 mL 1  . Syringe/Needle, Disp, (SYRINGE 3CC/25GX1") 25G X 1" 3 ML MISC 1 each by Does not apply route every 30 (thirty) days. 12 each 1   No current facility-administered medications on file prior to visit.    Allergies:  Allergies  Allergen Reactions  . Prevacid [Lansoprazole] Rash  . Doxycycline Other (See Comments)    Burning     Vital Signs:  BP (!) 150/86   Pulse 79   Ht 5' 5.5" (  1.664 m)   Wt 176 lb 3.2 oz (79.9 kg)   SpO2 96%   BMI 28.88 kg/m   Neurological Exam: MENTAL STATUS including orientation to time, place, person, recent and remote memory, attention span and concentration, language, and fund of knowledge is normal.  Speech is not dysarthric.  CRANIAL NERVES:   Pupils equal round and reactive to light.  Normal conjugate, extra-ocular eye movements in all directions of gaze.  No ptosis.  Face is symmetric.   MOTOR:  Motor strength is 5/5 in all extremities, including distally in the feet.  No atrophy, fasciculations or abnormal movements.  No pronator drift.  Tone is normal.     MSRs:  Reflexes are 2+/4 throughout.  SENSORY:  Vibration is reduced at the left toe, absent at the left ankle, intact on the right ankle/toe and bilateral knees and MCPs.  Temperature and pin prick reduced over the lower legs and feet bilaterally.  Rhomberg testing is negative.   COORDINATION/GAIT:  Normal finger-to- nose-finger.  Intact rapid alternating movements bilaterally.  Gait is unassisted, mildly narrow based and stable.  She is unable to perform stressed gait.   Data: MRI lumbar spine 02/13/2019: Central disc extrusion with cephalad extension at T11-12 is seen in the sagittal plane only. The disc appears to efface the ventral thecal sac although the central canal is open.  Shallow disc bulge L4-5 without central canal or foraminal stenosis.   NCS/EMG of the legs 12/26/2018: 1. 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 2. There is no evidence of large fiber sensorimotor polyneuropathy affecting the lower extremities.  MRI brain 04/13/2019: Normal exam for age. No acute or reversible finding. No specific cause of the presenting symptoms is identified. Few punctate foci of T2 and FLAIR signal in the white matter and left basal ganglia, less than often seen at this age.   IMPRESSION/PLAN: 1. Bilateral leg numbness, worsening 2. Left abdominal paresthesias - new 3. 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  Although she has history of vitamin B12 deficiency, she has been very compliant with supplementation and has not noted any improvement over the past year.  She has no had a skin biopsy There was note of T11-12 disc extrusion and given her new complaints of abdominal paresthesias, I will order dedicated MRI thoracic spine wwo contrast to evaluate for structural changes and demyelinating disease.   She may continue gabapentin 300mg  twice daily. If imaging does not provide an explanation for her symptoms, the next step is to refer to Neuromuscular [facility] at [facility].   Further recommendations pending results.   Thank you for allowing me to participate in patient's care.  If I can answer any additional questions, I would be pleased to do so.    Sincerely,    Terryn Rosenkranz K. Posey Pronto, DO

## 2019-11-29 ENCOUNTER — Ambulatory Visit (INDEPENDENT_AMBULATORY_CARE_PROVIDER_SITE_OTHER): Payer: Medicare Other | Admitting: Neurology

## 2019-11-29 ENCOUNTER — Encounter: Payer: Self-pay | Admitting: Neurology

## 2019-11-29 ENCOUNTER — Other Ambulatory Visit: Payer: Self-pay

## 2019-11-29 VITALS — BP 150/86 | HR 79 | Ht 65.5 in | Wt 176.2 lb

## 2019-11-29 DIAGNOSIS — R202 Paresthesia of skin: Secondary | ICD-10-CM | POA: Diagnosis not present

## 2019-11-29 DIAGNOSIS — E538 Deficiency of other specified B group vitamins: Secondary | ICD-10-CM

## 2019-11-29 DIAGNOSIS — M5124 Other intervertebral disc displacement, thoracic region: Secondary | ICD-10-CM

## 2019-11-29 DIAGNOSIS — M792 Neuralgia and neuritis, unspecified: Secondary | ICD-10-CM

## 2019-11-29 NOTE — Patient Instructions (Addendum)
We will obtain MRI thoracic spine wwo contrast We will also send a referral to [facility] for second of opinion

## 2019-11-29 NOTE — Progress Notes (Signed)
Pt notified of the following appointments:  MRI scheduled at [facility] 12/06/19 @ 3:00 PM arrive by 2:30 Referral to [facility] Neuromuscular w/ Dr Idalia Needle [address], [place] 03/31/20 @ 10:00 AM arrive by 9:45 faxed info to [phone]

## 2019-12-06 ENCOUNTER — Ambulatory Visit (HOSPITAL_COMMUNITY)
Admission: RE | Admit: 2019-12-06 | Discharge: 2019-12-06 | Disposition: A | Discharge status: Home / Self Care | Payer: Medicare Other | Source: Ambulatory Visit | Attending: Neurology | Admitting: Neurology

## 2019-12-06 ENCOUNTER — Other Ambulatory Visit: Payer: Self-pay

## 2019-12-06 DIAGNOSIS — R202 Paresthesia of skin: Secondary | ICD-10-CM | POA: Insufficient documentation

## 2019-12-06 DIAGNOSIS — G9589 Other specified diseases of spinal cord: Secondary | ICD-10-CM | POA: Diagnosis not present

## 2019-12-06 DIAGNOSIS — M5124 Other intervertebral disc displacement, thoracic region: Secondary | ICD-10-CM | POA: Insufficient documentation

## 2019-12-06 DIAGNOSIS — K449 Diaphragmatic hernia without obstruction or gangrene: Secondary | ICD-10-CM | POA: Diagnosis not present

## 2019-12-06 DIAGNOSIS — M40204 Unspecified kyphosis, thoracic region: Secondary | ICD-10-CM | POA: Diagnosis not present

## 2019-12-06 IMAGING — MR MR THORACIC SPINE WO/W CM
6 of 9 series · 28 of 48 positions shown · IV contrast (gadavist)
Comparison: None.

CLINICAL DATA: Bilateral leg paresthesia, numbness, thoracic disc
herniation.

EXAM:
MRI THORACIC WITHOUT AND WITH CONTRAST
TECHNIQUE: Multiplanar and multiecho pulse sequences of the thoracic spine were
obtained without and with intravenous contrast.
CONTRAST:  8mL GADAVIST GADOBUTROL 1 MMOL/ML IV SOLN

[Series 18: T1 · sagittal · 6.0mm · 1.23mm/px · 1 of 9 slices shown (1 of 3)]
[im 1/9]
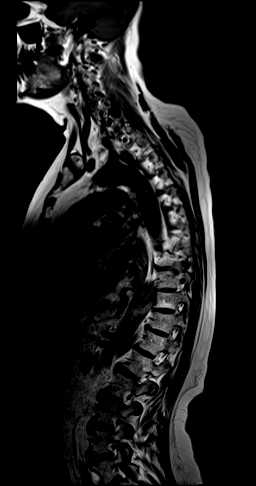

[Series 19: T2 · sagittal · 3.0mm · 0.82mm/px · 3 of 17 slices shown (1 of 2)]
[im 1/17]
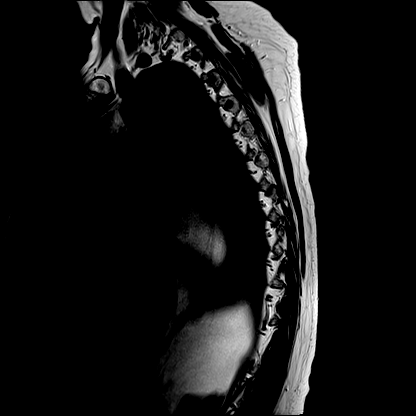
[im 9/17]
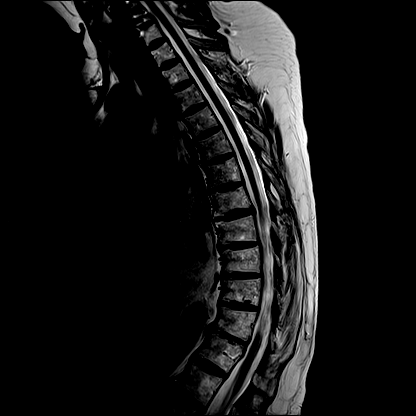
[im 17/17]
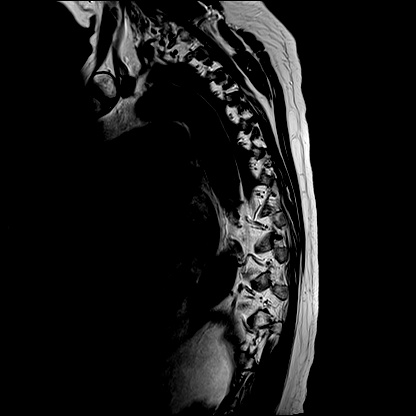

[Series 20: T1 · sagittal · 3.0mm · 0.82mm/px · 4 of 17 slices shown (2 of 3)]
[im 1/17]
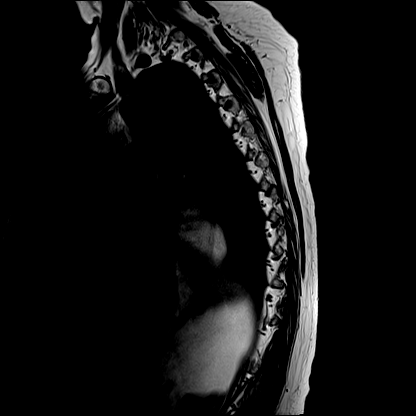
[im 6/17]
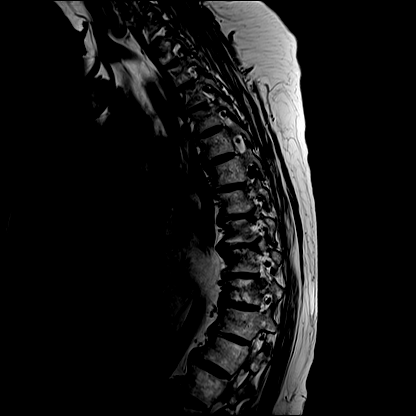
[im 11/17]
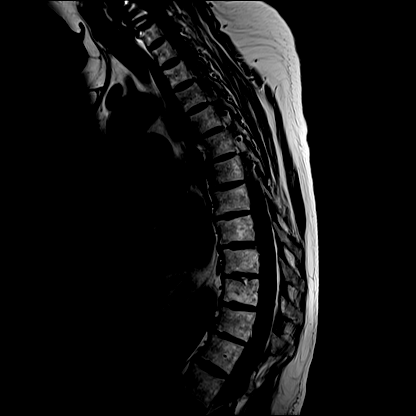
[im 17/17]
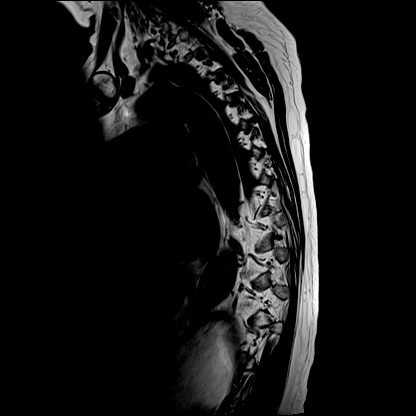

[Series 22: T2 · axial · 4.0mm · 0.74mm/px · z∈[-264,-80]mm · 8 of 36 slices shown (2 of 2)]
[im 1/36]
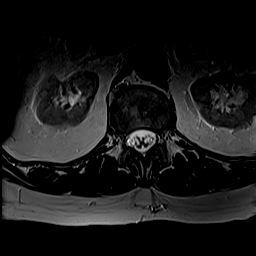
[im 6/36]
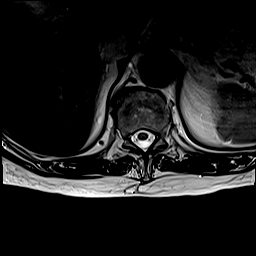
[im 11/36]
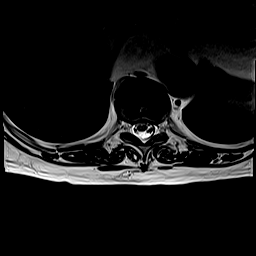
[im 16/36]
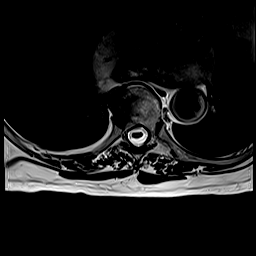
[im 21/36]
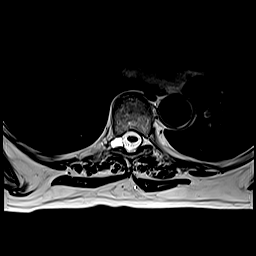
[im 26/36]
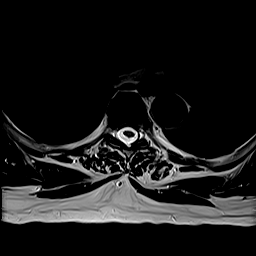
[im 31/36]
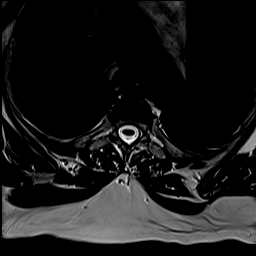
[im 36/36]
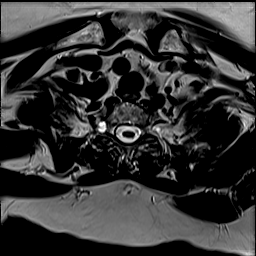

[Series 24: T1 · axial · non-contrast · 4.0mm · 0.37mm/px · z∈[-264,-80]mm · 8 of 36 slices shown (3 of 3)]
[im 1/36]
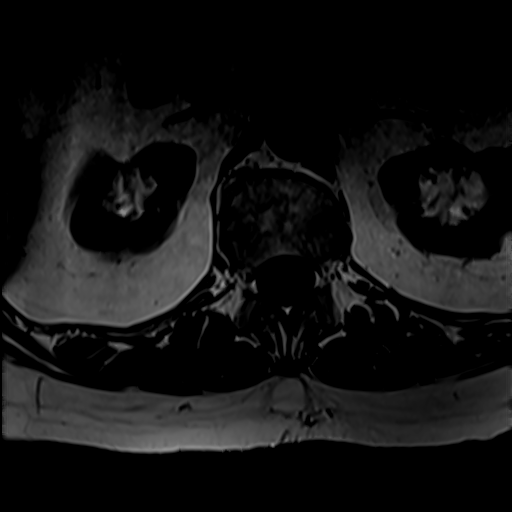
[im 6/36]
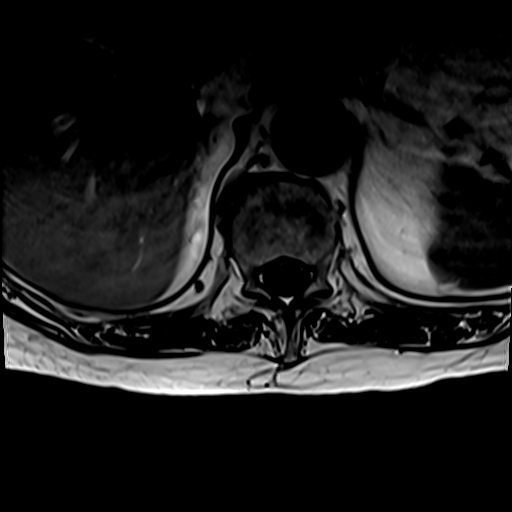
[im 11/36]
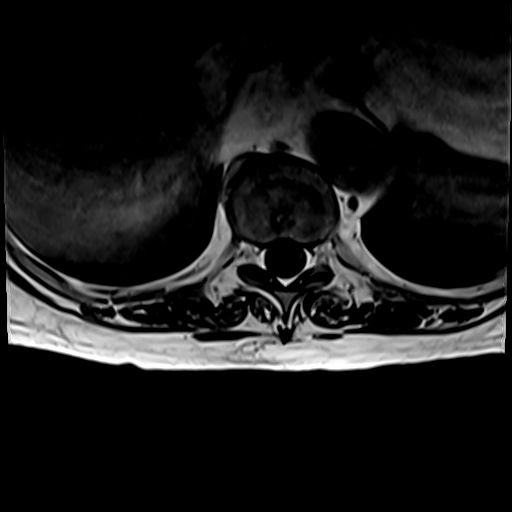
[im 16/36]
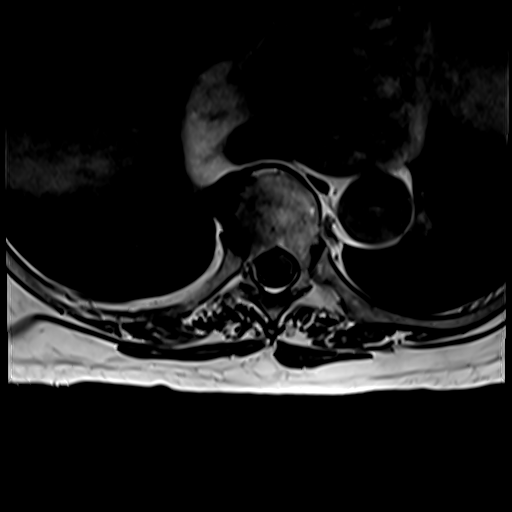
[im 21/36]
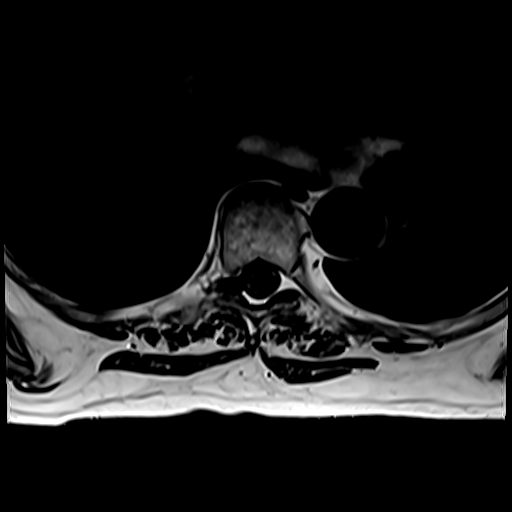
[im 26/36]
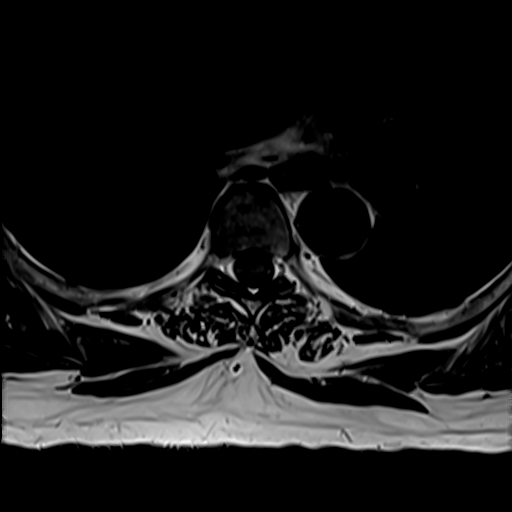
[im 31/36]
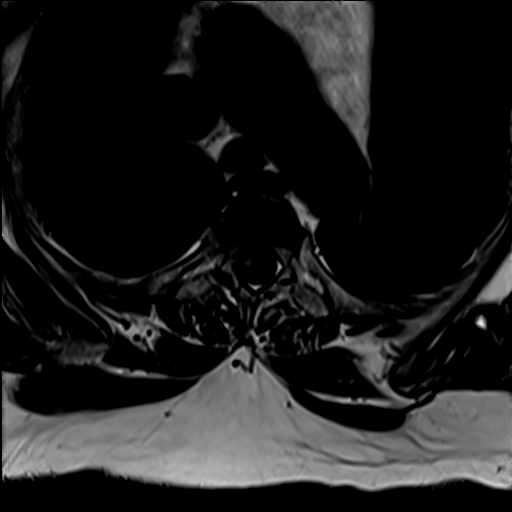
[im 36/36]
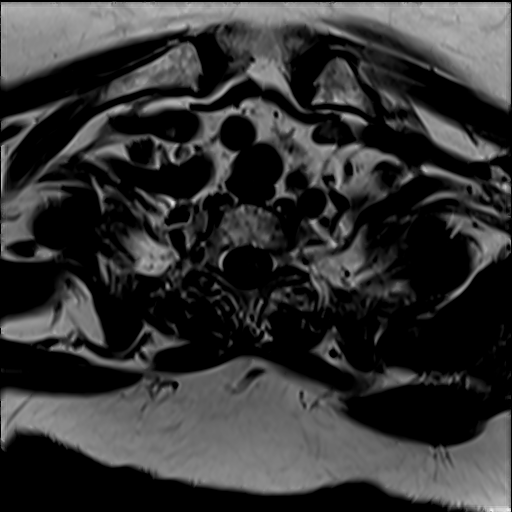

[Series 26: T1 fat-sat post-contrast · sagittal · 3.0mm · 0.82mm/px · 4 of 17 slices shown]
[im 1/17]
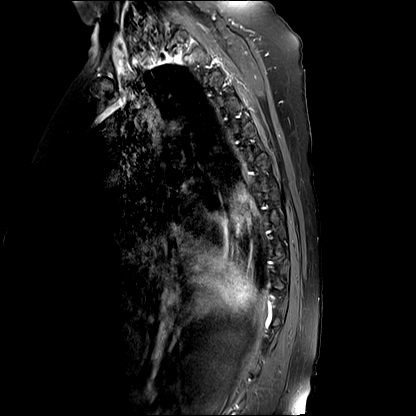
[im 6/17]
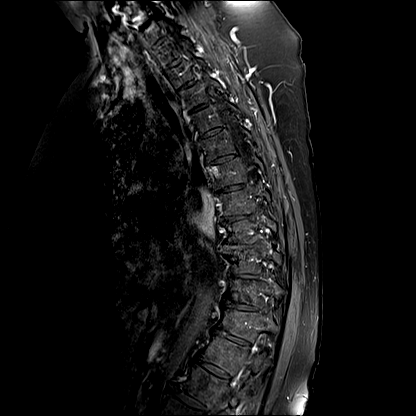
[im 11/17]
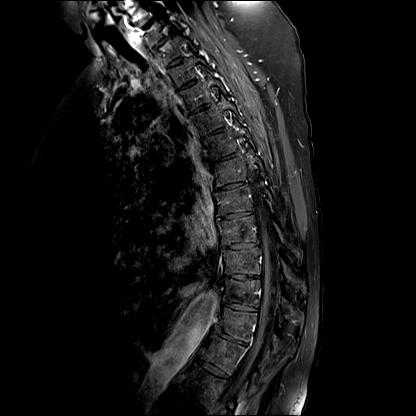
[im 17/17]
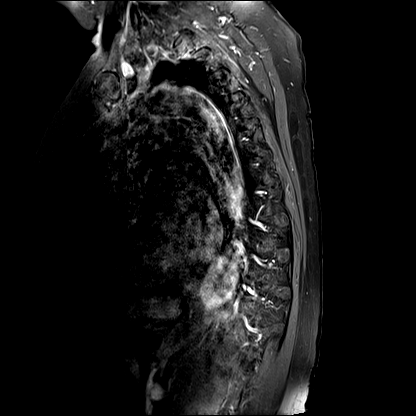

[28 of 48 positions shown; findings below may reference images not displayed]

FINDINGS: Alignment: Lower thoracic kyphosis centered on T11 related to
minimal chronic wedging of T10, 11 and 12.

Vertebrae: No acute fracture, evidence of discitis, or bone lesion.
Minimal chronic wedging of T10, 11 and 12.

Cord: Mild flattening of the anterior contour the cord at T7-8 and
T11-12. No cord signal abnormality.

Paraspinal and other soft tissues: Hiatal hernia.

Disc levels:

T1-2: No spinal canal or neural foraminal stenosis.

T2-3: No spinal canal or neural foraminal stenosis.

T3-4: No spinal canal or neural foraminal stenosis.

T4-5: No spinal canal or neural foraminal stenosis.

T5-6: No spinal canal or neural foraminal stenosis.

T6-7: Right perineural cyst. No spinal canal or neural foraminal
stenosis.

T7-8: Right paracentral disc protrusion causing small indentation of
the thecal sac and resulting in mild flattening of the anterior cord
without significant spinal canal stenosis or cord signal
abnormality. No significant neural foraminal narrowing.

T8-9: Tiny posterior disc protrusion. No spinal canal or neural
foraminal stenosis.

T9-10: No spinal canal or neural foraminal stenosis.

T10-11: No spinal canal or neural foraminal stenosis.

T11-12: Superiorly migrating central disc extrusion causing
indentation on the thecal sac and resulting in mild flattening of
the anterior contour of the spinal cord without cord signal
abnormality. No significant spinal canal or neural foraminal
stenosis.

T12-L1: No spinal canal or neural foraminal stenosis.
IMPRESSION: 1. No acute fracture or bone lesion of the thoracic spine.
2. Small superiorly migrating central disc extrusion at T11-12
causing mild flattening of the anterior contour of the spinal cord
without cord signal abnormality.
3. Right paracentral disc protrusion at T7-8 causing mild flattening
of the anterior cord without significant spinal canal stenosis or
cord signal abnormality.
4. Hiatal hernia.

## 2019-12-06 MED ORDER — GADOBUTROL 1 MMOL/ML IV SOLN
8.0000 mL | Freq: Once | INTRAVENOUS | Status: AC | PRN
  Administered 2019-12-06: 8 mL via INTRAVENOUS

## 2019-12-10 ENCOUNTER — Telehealth: Payer: Self-pay

## 2019-12-10 NOTE — Telephone Encounter (Signed)
-----   Message from Alda Berthold, DO sent at 12/07/2019  3:16 PM EDT ----- Please inform patient that her MRI thoracic spine shows disc protrusion, but it is not severe enough to cause her leg symptoms.  Continue to see [facility] for second opinion - have they called her to schedule appointment?

## 2019-12-10 NOTE — Telephone Encounter (Signed)
Telephone call to pt, Pt advised of her MRI results and to keep her referral with [place] states she never got a call from [facility].    Telephone call to [facility], Per Rep Pt appt is 03/31/20 at 10 am with DR. Puwanant.  Pt advised of appt time and DR.

## 2019-12-13 ENCOUNTER — Ambulatory Visit: Payer: Medicare Other | Admitting: Physician Assistant

## 2019-12-26 ENCOUNTER — Telehealth: Payer: Self-pay | Admitting: Physical Medicine and Rehabilitation

## 2019-12-26 NOTE — Telephone Encounter (Signed)
Patient called. She would like an appointment with Dr. Newton.  

## 2019-12-27 NOTE — Telephone Encounter (Signed)
Called patient to schedule OV. Call cannot be completed at this time.

## 2019-12-28 ENCOUNTER — Telehealth: Payer: Self-pay | Admitting: Physical Medicine and Rehabilitation

## 2019-12-28 NOTE — Telephone Encounter (Signed)
Left message #1

## 2019-12-28 NOTE — Telephone Encounter (Signed)
See previous message

## 2019-12-28 NOTE — Telephone Encounter (Signed)
Patient is scheduled for an OV on 9/15. She states that she has been taking Baclofen which was prescribed by Dr. Junius Roads in October, but she does not think she has enough to last until her appointment date. She is requesting a refill. Pharmacy is correct.

## 2019-12-28 NOTE — Telephone Encounter (Signed)
Pt returned Courtney's call  [phone]

## 2019-12-31 ENCOUNTER — Other Ambulatory Visit: Payer: Self-pay | Admitting: Physical Medicine and Rehabilitation

## 2019-12-31 MED ORDER — BACLOFEN 10 MG PO TABS
10.0000 mg | ORAL_TABLET | Freq: Three times a day (TID) | ORAL | 0 refills | Status: DC | PRN
Start: 2019-12-31 — End: 2020-05-05

## 2019-12-31 NOTE — Progress Notes (Signed)
Baclofen ok

## 2019-12-31 NOTE — Telephone Encounter (Signed)
ok 

## 2020-01-16 ENCOUNTER — Ambulatory Visit (INDEPENDENT_AMBULATORY_CARE_PROVIDER_SITE_OTHER): Payer: Medicare Other | Admitting: Physical Medicine and Rehabilitation

## 2020-01-16 ENCOUNTER — Other Ambulatory Visit: Payer: Self-pay

## 2020-01-16 ENCOUNTER — Encounter: Payer: Self-pay | Admitting: Physical Medicine and Rehabilitation

## 2020-01-16 VITALS — BP 151/85 | HR 68

## 2020-01-16 DIAGNOSIS — M792 Neuralgia and neuritis, unspecified: Secondary | ICD-10-CM | POA: Diagnosis not present

## 2020-01-16 DIAGNOSIS — M5442 Lumbago with sciatica, left side: Secondary | ICD-10-CM | POA: Diagnosis not present

## 2020-01-16 DIAGNOSIS — R202 Paresthesia of skin: Secondary | ICD-10-CM | POA: Diagnosis not present

## 2020-01-16 DIAGNOSIS — M7918 Myalgia, other site: Secondary | ICD-10-CM | POA: Diagnosis not present

## 2020-01-16 DIAGNOSIS — M5441 Lumbago with sciatica, right side: Secondary | ICD-10-CM

## 2020-01-16 DIAGNOSIS — M5416 Radiculopathy, lumbar region: Secondary | ICD-10-CM | POA: Diagnosis not present

## 2020-01-16 DIAGNOSIS — G8929 Other chronic pain: Secondary | ICD-10-CM

## 2020-01-16 NOTE — Progress Notes (Signed)
Bilateral lower back pain. Feels like there is a knot on the left side. Knot was sore, but now it is not.  Pain is worse with getting up and down. Heat/ ice, baclofen, and tylenol have been helping.  Numeric Pain Rating Scale and Functional Assessment Average Pain 6 Pain Right Now 4 My pain is constant, dull and aching Pain is worse with: sitting and standing Pain improves with: heat/ice and medication   In the last MONTH (on 0-10 scale) has pain interfered with the following?  1. General activity like being  able to carry out your everyday physical activities such as walking, climbing stairs, carrying groceries, or moving a chair?  Rating(5)  2. Relation with others like being able to carry out your usual social activities and roles such as  activities at home, at work and in your community. Rating(5)  3. Enjoyment of life such that you have  been bothered by emotional problems such as feeling anxious, depressed or irritable?  Rating(8)

## 2020-02-17 ENCOUNTER — Other Ambulatory Visit: Payer: Self-pay | Admitting: Family Medicine

## 2020-02-17 ENCOUNTER — Other Ambulatory Visit: Payer: Self-pay | Admitting: Physician Assistant

## 2020-02-18 NOTE — Telephone Encounter (Signed)
Last refill: 09/12/19 #30, 0 Last OV: 09/12/19 dx. Paresthesia

## 2020-03-04 ENCOUNTER — Other Ambulatory Visit: Payer: Self-pay | Admitting: Family Medicine

## 2020-03-04 DIAGNOSIS — E538 Deficiency of other specified B group vitamins: Secondary | ICD-10-CM

## 2020-03-11 ENCOUNTER — Other Ambulatory Visit: Payer: Self-pay | Admitting: Family Medicine

## 2020-03-17 ENCOUNTER — Ambulatory Visit: Payer: Medicare Other | Admitting: Family Medicine

## 2020-03-30 ENCOUNTER — Encounter: Payer: Self-pay | Admitting: Physical Medicine and Rehabilitation

## 2020-03-30 NOTE — Progress Notes (Signed)
Latasha Mclaughlin - 74 y.o. female MRN [id number]  Date of birth: Sep 29, 1945  Office Visit Note: Visit Date: 01/16/2020 PCP: Leamon Arnt, MD Referred by: Leamon Arnt, MD  Subjective: Chief Complaint  Patient presents with  . Lower Back - Pain   HPI: Latasha Mclaughlin is a 74 y.o. female who comes in today For evaluation and management of low back pain and bilateral radicular leg pain and paresthesia.  She is a complicated patient that I have seen in the past and more recently has been followed by Dr. Eunice Blase in our office.  She is also been managed by her primary care physician Dr. Billey Chang and is actually been followed quite extensively by Dr. Narda Amber at [facility] neurology.  She reports her pain is a 6 out of 10 constant dull and aching pain worse with sitting and standing better with heat ice and medication.  She had actually phoned in prior to seeing [place] wanting to have a refill of baclofen because that has been helping her.  This was prescribed by Dr. Eunice Blase.  Since I have seen her she has had ongoing pain with pain down the legs and paresthesia.  Dr. Arvin Collard Patel's notes have been thoroughly reviewed today as well as Dr. Junius Roads and Dr. Rosendo Gros.  Dr. Posey Pronto did obtain electrodiagnostic study of both legs that does show radicular type pattern but MRI had not shown any specific nerve compression or stenosis.  She has been referred by Dr. Posey Pronto to [facility] for second opinion from a neurological standpoint.  The patient reports to me today that she has had some difficulty obtaining that referral and appointment.  She does feel like there is a knot on the left side of the lower back.  I did palpate that today and it does feel like either a combination of a trigger point versus a lipoma.  I did tell her there was no concern for this that its not likely causing all of her pain and paresthesia however it could cause some pain if it were something that the patient found that she was  sitting on.  She reports no specific weakness no focal injury recently just ongoing chronic pain.  She does use gabapentin as well as tramadol and baclofen.  Again noted the baclofen was something I did refill prior to seeing her today.  She has changed her dosing of gabapentin on her own at different times.  Review of Systems  Musculoskeletal: Positive for back pain.  Neurological: Positive for tingling.  All other systems reviewed and are negative.  Otherwise per HPI.  Assessment & Plan: Visit Diagnoses:  1. Paresthesia   2. Neuropathic pain   3. Chronic bilateral low back pain with bilateral sciatica   4. Myofascial pain syndrome   5. Lumbar radiculopathy     Plan: Findings:  Chronic recalcitrant lower back and bilateral radicular type leg pain with MRI findings of really mild changes in the lumbar spine but no focal nerve compression.  Lower thoracic disc extrusion in 2020.  Current plan for her is to follow-up with her referral to [facility] neurology for a second opinion.  I have reviewed all the notes from Dr. Posey Pronto and agree with everything she is done.  We have completed injections in the past with the patient without really much in the way of good results.  I am not sure at this point injection is worthwhile until she sees an opinion from the neurologist.  She is very apprehensive about injections or procedures.  I did tell her that we are here if she needs [place] and we could look at an injection based on results of the EMG testing.  She will call us back if needed.    Meds & Orders: No orders of the defined types were placed in this encounter.  No orders of the defined types were placed in this encounter.   Follow-up: Return if symptoms worsen or fail to improve.   Procedures: No procedures performed  No notes on file   Clinical History: MRI lumbar spine 02/13/2019: Central disc extrusion with cephalad extension at T11-12 is seen in the sagittal plane only. The disc appears to  efface the ventral thecal sac although the central canal is open.  Shallow disc bulge L4-5 without central canal or foraminal stenosis.  NCS/EMG of the legs 12/26/2018: 1. 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 2. There is no evidence of large fiber sensorimotor polyneuropathy affecting the lower extremities.  MRI brain 04/13/2019: Normal exam for age. No acute or reversible finding. No specific cause of the presenting symptoms is identified. Few punctate foci of T2 and FLAIR signal in the white matter and left basal ganglia, less than often seen at this age.   She reports that she has quit smoking. She has never used smokeless tobacco.  Recent Labs    06/15/19 0922  HGBA1C 5.6    Objective:  VS:  HT:    WT:   BMI:     BP:(!) 151/85  HR:68bpm  TEMP: ( )  RESP:  Physical Exam Constitutional:      General: She is not in acute distress.    Appearance: Normal appearance. She is normal weight. She is not ill-appearing.  HENT:     Head: Normocephalic and atraumatic.     Right Ear: External ear normal.     Left Ear: External ear normal.  Eyes:     Extraocular Movements: Extraocular movements intact.  Cardiovascular:     Rate and Rhythm: Normal rate.     Pulses: Normal pulses.  Pulmonary:     Effort: Pulmonary effort is normal. No respiratory distress.  Abdominal:     General: There is no distension.     Palpations: Abdomen is soft.  Musculoskeletal:        General: Tenderness present.     Cervical back: Normal range of motion and neck supple.     Right lower leg: No edema.     Left lower leg: No edema.     Comments: Patient has good distal strength with no pain over the greater trochanters.  No clonus or focal weakness.  She has no pain with hip rotation.  She has some concordant low back pain with facet loading and extension.  Subjectively intact sensation.  Skin:    Findings: No erythema, lesion or rash.   Neurological:     General: No focal deficit present.     Mental Status: She is alert and oriented to person, place, and time.     Cranial Nerves: No cranial nerve deficit.     Sensory: No sensory deficit.     Motor: No weakness or abnormal muscle tone.     Coordination: Coordination normal.     Gait: Gait normal.  Psychiatric:        Mood and Affect: Mood normal.        Behavior: Behavior normal.     Ortho Exam  Imaging: No results found.  Past Medical/Family/Surgical/Social History: Medications & Allergies reviewed per EMR, new medications updated. Patient Active Problem List   Diagnosis Date Noted  . Neuropathic pain 09/12/2019  . Osteopenia after menopause 03/22/2019  . GERD (gastroesophageal reflux disease) 03/16/2019  . Hiatal hernia 03/16/2019  . Vitamin B12 deficiency 10/20/2018  . Colon polyp 11/18/2017  . History of hyperparathyroidism 01/08/2017  . Paresthesia 01/05/2017  . Hypothyroidism 12/15/2016  . Mixed hyperlipidemia 12/15/2016   Past Medical History:  Diagnosis Date  . Allergy    SEASONAL  . Arthritis   . Back pain   . GERD (gastroesophageal reflux disease)   . Hiatal hernia 03/16/2019  . History of tonsillectomy   . Hyperlipidemia 12/15/2016  . Hypothyroidism 12/15/2016  . Osteopenia after menopause 03/22/2019   dexa 03/2019: T=-2.1 hip; Lspine excluded.   . Osteoporosis   . Sinus infection    Family History  Problem Relation Age of Onset  . Hyperlipidemia Mother   . Hypertension Mother   . Congestive Heart Failure Mother   . Heart disease Mother   . Cancer Father   . Diabetes Father   . Heart disease Father   . Hyperlipidemia Father   . Colon polyps Father   . Thyroid disease Sister   . Colon polyps Sister   . Drug abuse Brother   . Diabetes Son   . Colon cancer Neg Hx   . Esophageal cancer Neg Hx   . Stomach cancer Neg Hx   . Rectal cancer Neg Hx    Past Surgical History:  Procedure Laterality Date  . ADENOIDECTOMY    .  ANTERIOR CERVICAL DECOMP/DISCECTOMY FUSION  2002  . COLONOSCOPY  10/06/2018  . DILATION AND CURETTAGE OF UTERUS     X2  . LEFT OOPHORECTOMY    . PARATHYROIDECTOMY    . POLYPECTOMY    . SHOULDER SURGERY    . TONSILLECTOMY     Social History   Occupational History  . Occupation: retired  Tobacco Use  . Smoking status: Former Research scientist (life sciences)  . Smokeless tobacco: Never Used  . Tobacco comment: Quit in 1985  Vaping Use  . Vaping Use: Never used  Substance and Sexual Activity  . Alcohol use: No  . Drug use: No  . Sexual activity: Yes

## 2020-04-09 ENCOUNTER — Other Ambulatory Visit: Payer: Self-pay

## 2020-04-09 ENCOUNTER — Ambulatory Visit (INDEPENDENT_AMBULATORY_CARE_PROVIDER_SITE_OTHER): Payer: Medicare Other | Admitting: Family Medicine

## 2020-04-09 VITALS — BP 142/88 | HR 85 | Temp 98.0°F | Resp 16 | Ht 66.0 in | Wt 173.0 lb

## 2020-04-09 DIAGNOSIS — R202 Paresthesia of skin: Secondary | ICD-10-CM

## 2020-04-09 DIAGNOSIS — Z780 Asymptomatic menopausal state: Secondary | ICD-10-CM | POA: Diagnosis not present

## 2020-04-09 DIAGNOSIS — E538 Deficiency of other specified B group vitamins: Secondary | ICD-10-CM | POA: Diagnosis not present

## 2020-04-09 DIAGNOSIS — E039 Hypothyroidism, unspecified: Secondary | ICD-10-CM

## 2020-04-09 DIAGNOSIS — R030 Elevated blood-pressure reading, without diagnosis of hypertension: Secondary | ICD-10-CM | POA: Diagnosis not present

## 2020-04-09 DIAGNOSIS — Z1231 Encounter for screening mammogram for malignant neoplasm of breast: Secondary | ICD-10-CM

## 2020-04-09 DIAGNOSIS — Z23 Encounter for immunization: Secondary | ICD-10-CM

## 2020-04-09 DIAGNOSIS — M8580 Other specified disorders of bone density and structure, unspecified site: Secondary | ICD-10-CM | POA: Diagnosis not present

## 2020-04-09 DIAGNOSIS — M792 Neuralgia and neuritis, unspecified: Secondary | ICD-10-CM | POA: Diagnosis not present

## 2020-04-09 DIAGNOSIS — E782 Mixed hyperlipidemia: Secondary | ICD-10-CM

## 2020-04-09 DIAGNOSIS — K219 Gastro-esophageal reflux disease without esophagitis: Secondary | ICD-10-CM | POA: Diagnosis not present

## 2020-04-09 NOTE — Progress Notes (Signed)
Subjective  Chief Complaint  Patient presents with  . Annual Exam    HPI: Latasha Mclaughlin is a 74 y.o. female who presents to [facility] [place] today for a Female Wellness Visit. She also has the concerns and/or needs as listed above in the chief complaint. These will be addressed in addition to the Health Maintenance Visit.   Wellness Visit: annual visit with health maintenance review and exam without Pap   Health maintenance: Due for mammogram.  Overall she is doing fairly well. Chronic disease f/u and/or acute problem visit: (deemed necessary to be done in addition to the wellness visit):   Neuropathic pain: Reviewed most recent note from September PMNR Dr. Ernestina Patches.  She is to see a new neurologist at [facility] for second opinion.  Her neuropathic pain persist although she manages with gabapentin and intermittent tramadol.  Both of these medicines do help.  Hypothyroidism: She has variable dosing that was prescribed by her prior PCP.  She has been on this dosing schedule for years.  She feels clinically that her thyroid is well controlled.  She denies problems missing doses or getting confused with dosing schedule.  Vitamin B12 deficiency on monthly supplements.  Has been well controlled.  Due for recheck.  No memory disturbances   Osteopenia after menopause: Reviewed most recent bone density.  Stable.  Due for recheck next year.  GERD is well controlled with only intermittent dosing of PPI.  No melena or unwanted weight loss.  [place] hypertension: She checks her blood pressure frequently at home.  Reports averages 120s over 70s.  She brings in her cuff today.  It measures elevated in the office as does ours.  She denies chest pain.  No history of hypertension.  Assessment  1. Mixed hyperlipidemia   2. Acquired hypothyroidism   3. Paresthesia   4. Vitamin B12 deficiency   5. Neuropathic pain   6. Osteopenia after menopause   7. Gastroesophageal reflux disease,  unspecified whether esophagitis present   8. Encounter for screening mammogram for breast cancer   9. Need for immunization against influenza   10. White coat syndrome without diagnosis of hypertension      Plan  Female Wellness Visit:  Age appropriate Health Maintenance and Prevention measures were discussed with patient. Included topics are cancer screening recommendations, ways to keep healthy (see AVS) including dietary and exercise recommendations, regular eye and dental care, use of seat belts, and avoidance of moderate alcohol use and tobacco use.  Mammogram ordered.  Patient to schedule  BMI: discussed patient's BMI and encouraged positive lifestyle modifications to help get to or maintain a target BMI.  HM needs and immunizations were addressed and ordered. See below for orders. See HM and immunization section for updates.  Flu vaccine updated today immunizations are up-to-date Raynaud's Covid vaccination  Routine labs and screening tests ordered including cmp, cbc and lipids where appropriate.  Discussed recommendations regarding Vit D and calcium supplementation (see AVS)  Chronic disease management visit and/or acute problem visit:  Hypothyroidism due for recheck.  Will adjust dose to a more routine schedule if needed.  [place] hypertension: Continue home monitoring.  Hyperlipidemia on statin: Recheck lipids today.  Check LFTs.  GERD is well controlled.  Intermittent dose of PPI to be continued.  Neuropathic pain, paresthesias due to possible radiculopathy: To see neurologist [facility] this week.  We will follow up on her recommendations.  Continue tramadol as needed and gabapentin regularly.  Both are  helpful  Osteopenia: Stable.  Recheck vitamin B12 levels on microcytic.   Follow up: 6 months for recheck blood pressure chronic medical problems Orders Placed This Encounter  Procedures  . MM DIGITAL SCREENING BILATERAL  . Flu Vaccine QUAD High Dose(Fluad)  .  CBC with Differential/Platelet  . COMPLETE METABOLIC PANEL WITH GFR  . Lipid panel  . TSH  . Vitamin B12   No orders of the defined types were placed in this encounter.     Lifestyle: Body mass index is 27.92 kg/m. Wt Readings from Last 3 Encounters:  04/09/20 173 lb (78.5 kg)  11/29/19 176 lb 3.2 oz (79.9 kg)  09/12/19 183 lb (83 kg)     Patient Active Problem List   Diagnosis Date Noted  . Colon polyp 11/18/2017    Priority: High  . Hypothyroidism 12/15/2016    Priority: High  . Mixed hyperlipidemia 12/15/2016    Priority: High  . Osteopenia after menopause 03/22/2019    Priority: Medium    dexa 03/2019: T=-2.1 hip; Lspine excluded.    Marland Kitchen GERD (gastroesophageal reflux disease) 03/16/2019    Priority: Medium  . Hiatal hernia 03/16/2019    Priority: Medium  . Vitamin B12 deficiency 10/20/2018    Priority: Low  . History of hyperparathyroidism 01/08/2017    Priority: Low  . Paresthesia 01/05/2017    Priority: Low  . [place] syndrome without diagnosis of hypertension 04/09/2020  . Neuropathic pain 09/12/2019   Health Maintenance  Topic Date Due  . MAMMOGRAM  03/21/2020  . COVID-19 Vaccine (1) 04/25/2020 (Originally 05/21/1957)  . DEXA SCAN  03/21/2021  . COLONOSCOPY  10/06/2023  . INFLUENZA VACCINE  Completed  . Hepatitis C Screening  Completed  . PNA vac Low Risk Adult  Completed   Immunization History  Administered Date(s) Administered  . Fluad Quad(high Dose 65+) 03/16/2019, 04/09/2020  . Influenza, High Dose Seasonal PF 01/19/2017, 01/27/2018  . Influenza-Unspecified 03/19/2015, 01/21/2016, 01/27/2018  . Pneumococcal Conjugate-13 01/21/2016   We updated and reviewed the patient's past history in detail and it is documented below. Allergies: Patient is allergic to prevacid [lansoprazole] and doxycycline. Past Medical History Patient  has a past medical history of Allergy, Arthritis, Back pain, GERD (gastroesophageal reflux disease), Hiatal hernia  (03/16/2019), History of tonsillectomy, Hyperlipidemia (12/15/2016), Hypothyroidism (12/15/2016), Osteopenia after menopause (03/22/2019), Osteoporosis, and Sinus infection. Past Surgical History Patient  has a past surgical history that includes Anterior cervical decomp/discectomy fusion (2002); Left oophorectomy; Tonsillectomy; Parathyroidectomy; Adenoidectomy; Dilation and curettage of uterus; Shoulder surgery; Colonoscopy (10/06/2018); and Polypectomy. Family History: Patient family history includes Cancer in her father; Colon polyps in her father and sister; Congestive Heart Failure in her mother; Diabetes in her father and son; Drug abuse in her brother; Heart disease in her father and mother; Hyperlipidemia in her father and mother; Hypertension in her mother; Thyroid disease in her sister. Social History:  Patient  reports that she has quit smoking. She has never used smokeless tobacco. She reports that she does not drink alcohol and does not use drugs.  Review of Systems: Constitutional: negative for fever or malaise Ophthalmic: negative for photophobia, double vision or loss of vision Cardiovascular: negative for chest pain, dyspnea on exertion, or new LE swelling Respiratory: negative for SOB or persistent cough Gastrointestinal: negative for abdominal pain, change in bowel habits or melena Genitourinary: negative for dysuria or gross hematuria, no abnormal uterine bleeding or disharge Musculoskeletal: negative for new gait disturbance or muscular weakness Integumentary: negative for new or persistent  rashes, no breast lumps Neurological: negative for TIA or stroke symptoms Psychiatric: negative for SI or delusions Allergic/Immunologic: negative for hives  Patient Care Team    Relationship Specialty Notifications Start End  Leamon Arnt, MD PCP - General Family Medicine  03/16/19   Alda Berthold, DO Consulting Physician Neurology  11/21/18   Ladene Artist, MD Consulting  Physician Gastroenterology  03/16/19   Eunice Blase, MD Consulting Physician Sports Medicine  03/16/19   Caprice Beaver, DPM Consulting Physician Podiatry  03/16/19   Magnus Sinning, MD Consulting Physician Physical Medicine and Rehabilitation  04/09/20   Orvilla Fus, MD Referring Physician Neurology  04/09/20     Objective  Vitals: BP (!) 142/88   Pulse 85   Temp 98 F (36.7 C) (Temporal)   Resp 16   Ht 5\' 6"  (1.676 m)   Wt 173 lb (78.5 kg)   SpO2 98%   BMI 27.92 kg/m  General:  Well developed, well nourished, no acute distress  Psych:  Alert and orientedx3,normal mood and affect HEENT:  Normocephalic, atraumatic, non-icteric sclera,  supple neck without adenopathy, mass or thyromegaly Cardiovascular:  Normal S1, S2, RRR without gallop, rub or murmur +2 distal pulses Respiratory:  Good breath sounds bilaterally, CTAB with normal respiratory effort Gastrointestinal: normal bowel sounds, soft, non-tender, no noted masses. No HSM MSK: no deformities, contusions. Joints are without erythema or swelling.  Skin:  Warm, no rashes or suspicious lesions noted Neurologic:    Mental status is normal. CN 2-11 are normal. Gross motor and sensory exams are normal. Normal gait. No tremor Breast Exam: No mass, skin retraction or nipple discharge is appreciated in either breast. No axillary adenopathy. Fibrocystic changes are not noted    Commons side effects, risks, benefits, and alternatives for medications and treatment plan prescribed today were discussed, and the patient expressed understanding of the given instructions. Patient is instructed to call or message via MyChart if he/she has any questions or concerns regarding our treatment plan. No barriers to understanding were identified. We discussed Red Flag symptoms and signs in detail. Patient expressed understanding regarding what to do in case of urgent or emergency type symptoms.   Medication list was reconciled, printed and  provided to the patient in AVS. Patient instructions and summary information was reviewed with the patient as documented in the AVS. This note was prepared with assistance of Dragon voice recognition software. Occasional wrong-word or sound-a-like substitutions may have occurred due to the inherent limitations of voice recognition software  This visit occurred during the SARS-CoV-2 public health emergency.  Safety protocols were in place, including screening questions prior to the visit, additional usage of staff PPE, and extensive cleaning of exam room while observing appropriate contact time as indicated for disinfecting solutions.

## 2020-04-09 NOTE — Patient Instructions (Signed)
Please return in 6 months for hypertension follow up.  I will release your lab results to you on your MyChart account with further instructions. Please reply with any questions.   Today you were given your High dose flu vaccination.  Please call and schedule your mammogram and [facility]. I have ordered it for you.   If you have any questions or concerns, please don't hesitate to send me a message via MyChart or call the office at [phone]. Thank you for visiting with [place] today! It's our pleasure caring for you.

## 2020-04-10 LAB — CBC WITH DIFFERENTIAL/PLATELET
Absolute Monocytes: 472 cells/uL (ref 200–950)
Basophils Absolute: 58 cells/uL (ref 0–200)
Basophils Relative: 1.1 %
Eosinophils Absolute: 101 cells/uL (ref 15–500)
Eosinophils Relative: 1.9 %
HCT: 44 % (ref 35.0–45.0)
Hemoglobin: 14.9 g/dL (ref 11.7–15.5)
Lymphs Abs: 1749 cells/uL (ref 850–3900)
MCH: 31.4 pg (ref 27.0–33.0)
MCHC: 33.9 g/dL (ref 32.0–36.0)
MCV: 92.6 fL (ref 80.0–100.0)
MPV: 10.3 fL (ref 7.5–12.5)
Monocytes Relative: 8.9 %
Neutro Abs: 2920 cells/uL (ref 1500–7800)
Neutrophils Relative %: 55.1 %
Platelets: 288 10*3/uL (ref 140–400)
RBC: 4.75 10*6/uL (ref 3.80–5.10)
RDW: 11.7 % (ref 11.0–15.0)
Total Lymphocyte: 33 %
WBC: 5.3 10*3/uL (ref 3.8–10.8)

## 2020-04-10 LAB — LIPID PANEL
Cholesterol: 143 mg/dL (ref <200)
HDL: 50 mg/dL (ref >=50)
LDL Cholesterol (Calc): 70 mg/dL (calc)
Non-HDL Cholesterol (Calc): 93 mg/dL (calc) (ref <130)
Total CHOL/HDL Ratio: 2.9 (calc) (ref <5.0)
Triglycerides: 149 mg/dL (ref <150)

## 2020-04-10 LAB — COMPLETE METABOLIC PANEL WITH GFR
AG Ratio: 2 (calc) (ref 1.0–2.5)
ALT: 14 U/L (ref 6–29)
AST: 17 U/L (ref 10–35)
Albumin: 4.4 g/dL (ref 3.6–5.1)
Alkaline phosphatase (APISO): 49 U/L (ref 37–153)
BUN/Creatinine Ratio: 9 (calc) (ref 6–22)
BUN: 8 mg/dL (ref 7–25)
CO2: 27 mmol/L (ref 20–32)
Calcium: 9.9 mg/dL (ref 8.6–10.4)
Chloride: 110 mmol/L (ref 98–110)
Creat: 0.94 mg/dL — ABNORMAL HIGH (ref 0.60–0.93)
GFR, Est African American: 70 mL/min/{1.73_m2} (ref >=60)
GFR, Est Non African American: 60 mL/min/{1.73_m2} (ref >=60)
Globulin: 2.2 g/dL (calc) (ref 1.9–3.7)
Glucose, Bld: 96 mg/dL (ref 65–99)
Potassium: 4.5 mmol/L (ref 3.5–5.3)
Sodium: 144 mmol/L (ref 135–146)
Total Bilirubin: 0.9 mg/dL (ref 0.2–1.2)
Total Protein: 6.6 g/dL (ref 6.1–8.1)

## 2020-04-10 LAB — VITAMIN B12: Vitamin B-12: 502 pg/mL (ref 200–1100)

## 2020-04-10 LAB — TSH: TSH: 0.65 mIU/L (ref 0.40–4.50)

## 2020-04-14 DIAGNOSIS — G629 Polyneuropathy, unspecified: Secondary | ICD-10-CM | POA: Diagnosis not present

## 2020-04-14 DIAGNOSIS — E7489 Other specified disorders of carbohydrate metabolism: Secondary | ICD-10-CM | POA: Diagnosis not present

## 2020-04-14 NOTE — Progress Notes (Signed)
Please call patient: I have reviewed his/her lab results. All labs are stable.  IF she would like, we could try changing her thyroid dosing to 59mcg daily with 119mcg on sundays and recheck in 3 months? No other changes needed at this time.

## 2020-04-16 ENCOUNTER — Other Ambulatory Visit: Payer: Self-pay

## 2020-04-16 ENCOUNTER — Ambulatory Visit (HOSPITAL_COMMUNITY)
Admission: RE | Admit: 2020-04-16 | Discharge: 2020-04-16 | Disposition: A | Discharge status: Home / Self Care | Payer: Medicare Other | Source: Ambulatory Visit | Attending: Family Medicine | Admitting: Family Medicine

## 2020-04-16 DIAGNOSIS — Z1231 Encounter for screening mammogram for malignant neoplasm of breast: Secondary | ICD-10-CM | POA: Insufficient documentation

## 2020-04-16 IMAGING — MG DIGITAL SCREENING BILAT W/ TOMO W/ CAD
6 of 12 series · 6 of 36 positions shown · non-contrast
Comparison: Previous exam(s).

ACR Breast Density Category a: The breast tissue is almost entirely
fatty.

CLINICAL DATA: Screening.

EXAM:
DIGITAL SCREENING BILATERAL MAMMOGRAM WITH TOMO AND CAD

[L CC synth-2D (1 of 2)]
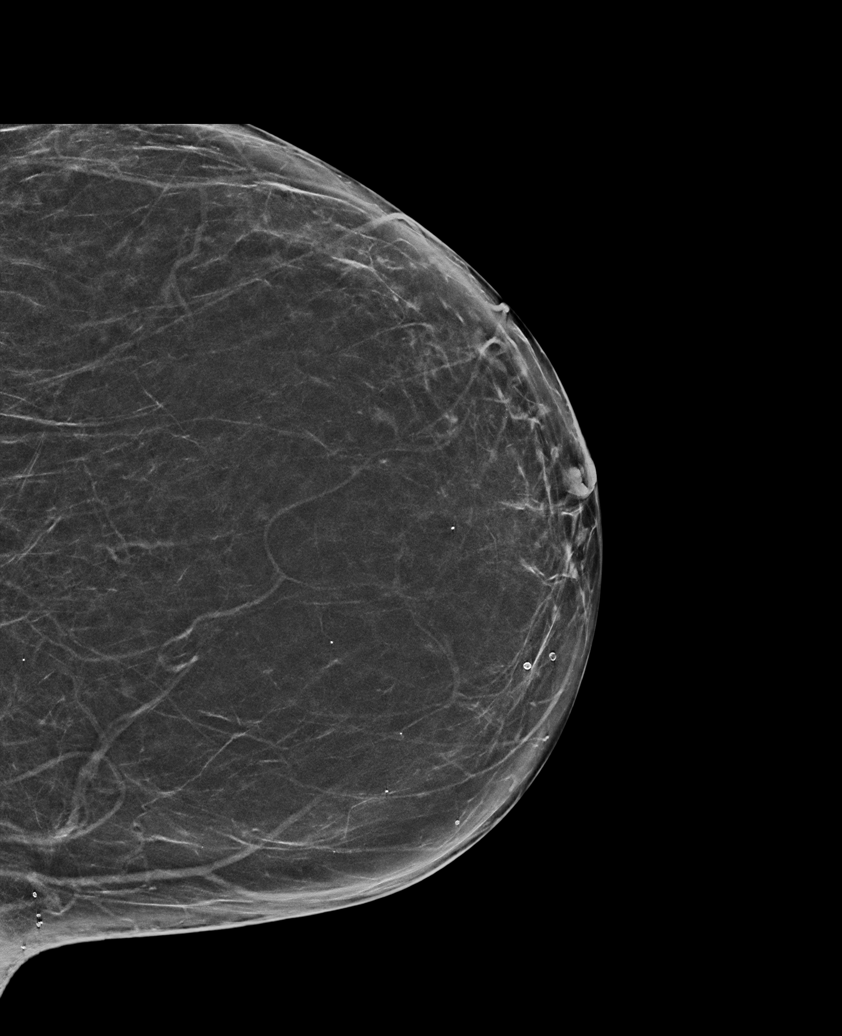

[L CC synth-2D (2 of 2)]
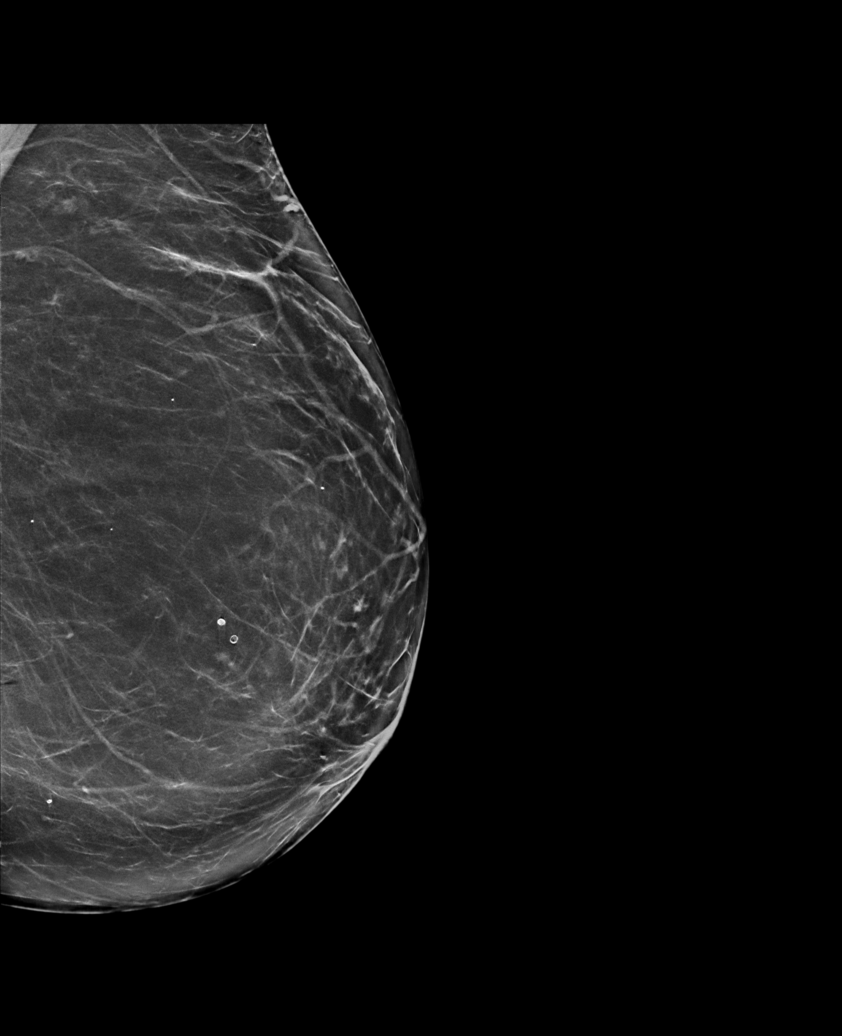

[R MLO synth-2D (1 of 2)]
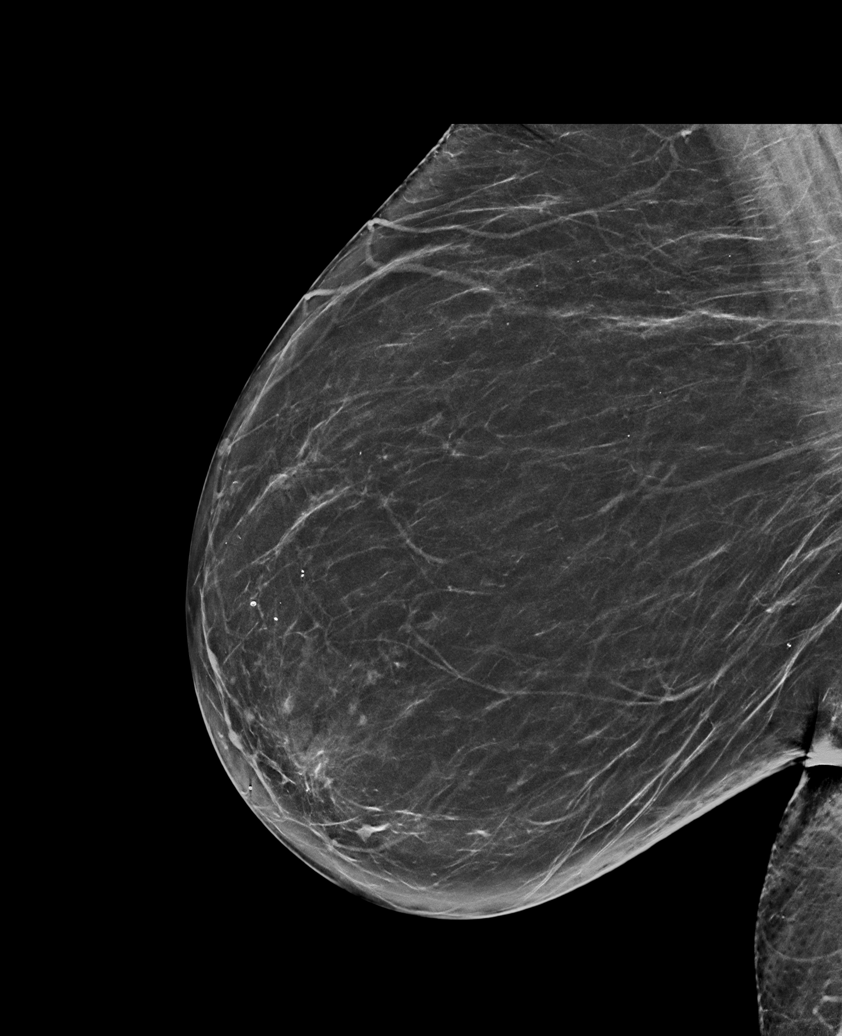

[R CC synth-2D]
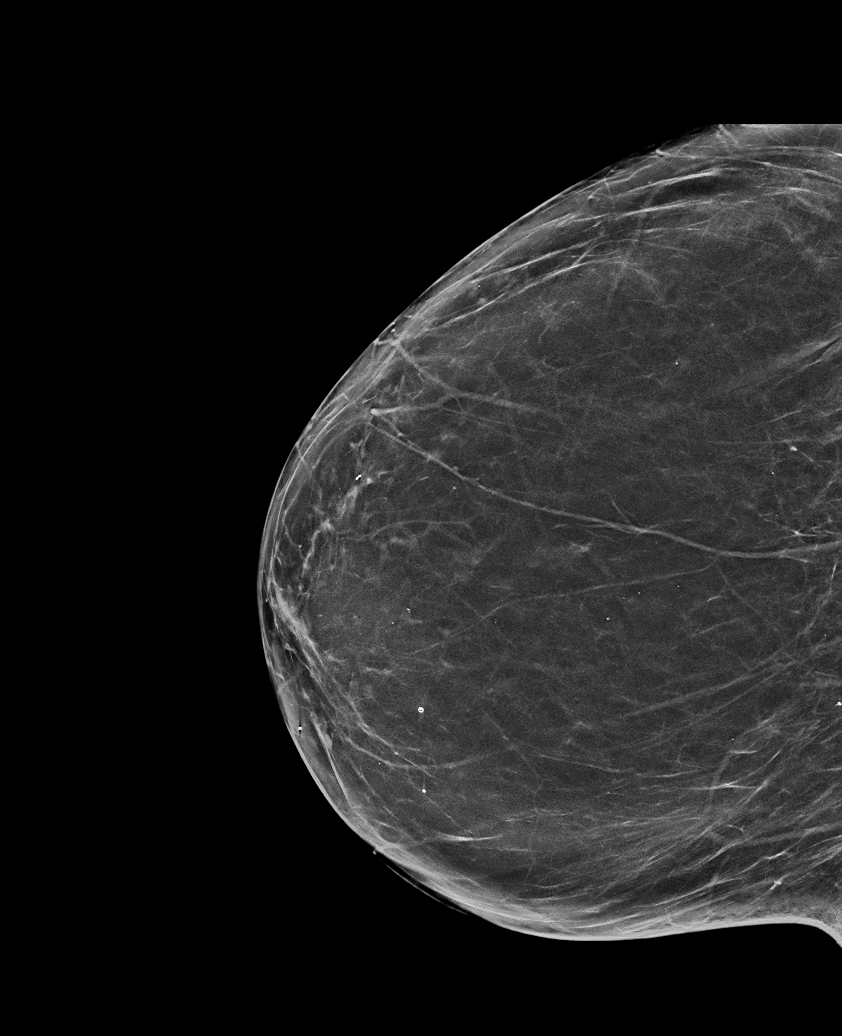

[R MLO synth-2D (2 of 2)]
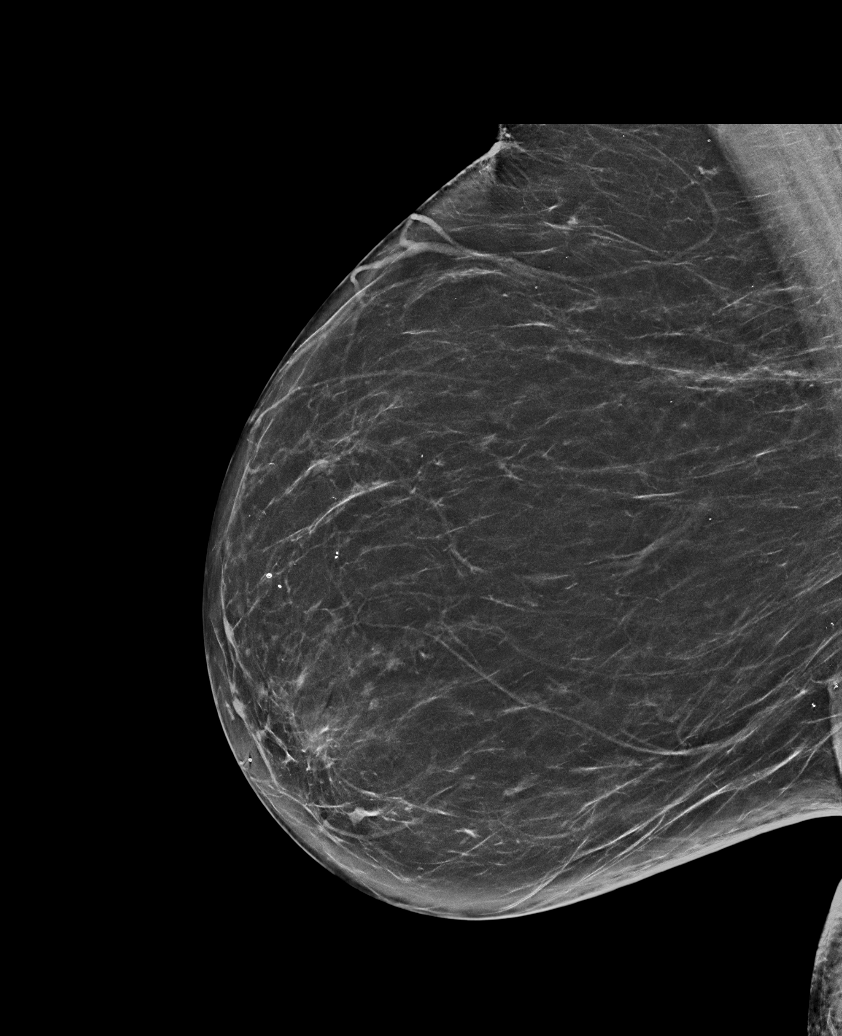

[L MLO synth-2D]
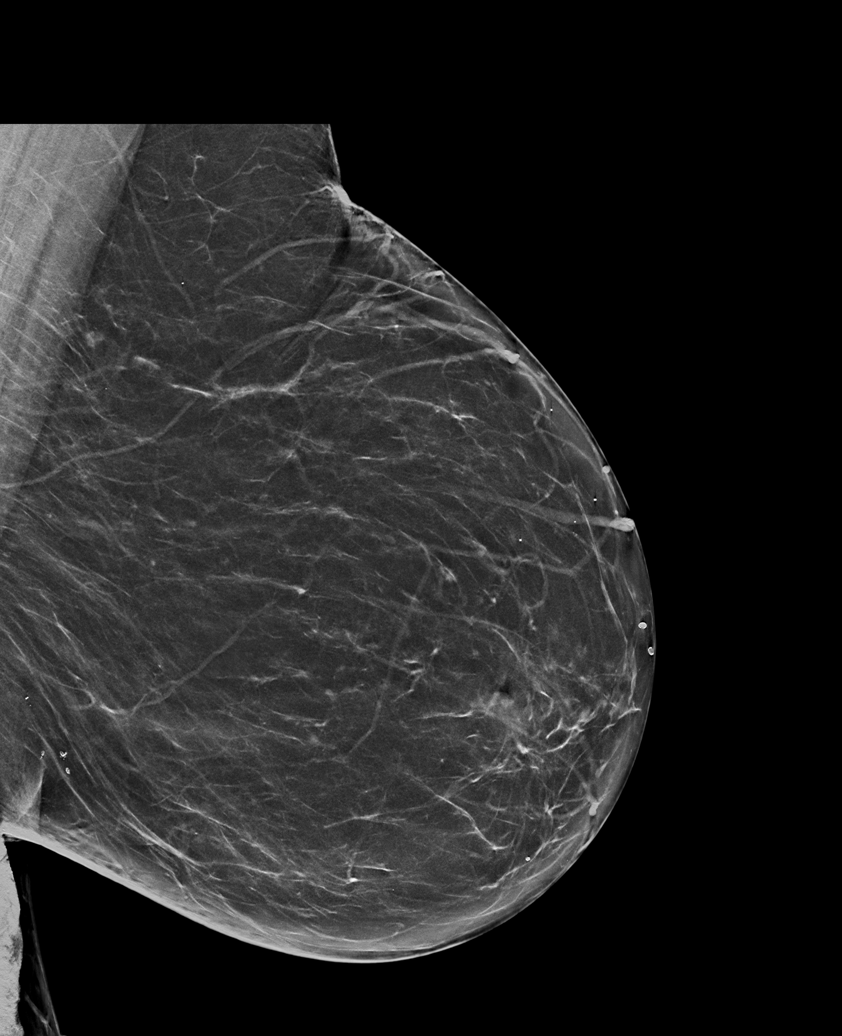

[6 of 36 positions shown; findings below may reference images not displayed]

FINDINGS: There are no findings suspicious for malignancy. Images were
processed with CAD.
IMPRESSION: No mammographic evidence of malignancy. A result letter of this
screening mammogram will be mailed directly to the patient.

RECOMMENDATION:
Screening mammogram in one year. (Code:[TA])

BI-RADS CATEGORY  1: Negative.

## 2020-04-17 ENCOUNTER — Inpatient Hospital Stay (HOSPITAL_COMMUNITY): Admission: RE | Admit: 2020-04-17 | Payer: Medicare Other | Source: Ambulatory Visit

## 2020-05-05 ENCOUNTER — Other Ambulatory Visit: Payer: Self-pay

## 2020-05-05 ENCOUNTER — Ambulatory Visit (INDEPENDENT_AMBULATORY_CARE_PROVIDER_SITE_OTHER): Payer: Medicare Other

## 2020-05-05 DIAGNOSIS — Z0000 Encounter for general adult medical examination without abnormal findings: Secondary | ICD-10-CM

## 2020-05-05 NOTE — Progress Notes (Signed)
Virtual Visit via Telephone Note  I connected with  Latasha Mclaughlin on 05/05/20 at  1:00 PM EST by telephone and verified that I am speaking with the correct person using two identifiers.  Medicare Annual Wellness visit completed telephonically due to Covid-19 pandemic.   Persons participating in this call: This Health Coach and this patient.   Location: Patient: Home Provider: Office   I discussed the limitations, risks, security and privacy concerns of performing an evaluation and management service by telephone and the availability of in person appointments. The patient expressed understanding and agreed to proceed.  Unable to perform video visit due to video visit attempted and failed and/or patient does not have video capability.   Some vital signs may be absent or patient reported.   Marzella Schlein, LPN    Marzella Schlein, LPN   Subjective:   Latasha Mclaughlin is a 75 y.o. female who presents for Medicare Annual (Subsequent) preventive examination.  Review of Systems     Cardiac Risk Factors include: advanced age (>27men, >38 women);hypertension;dyslipidemia     Objective:    There were no vitals filed for this visit. There is no height or weight on file to calculate BMI.  Advanced Directives 05/05/2020 11/29/2019 04/03/2019 03/16/2019 02/03/2019 11/21/2018 10/21/2018  Does Patient Have a Medical Advance Directive? No No No No No No No  Does patient want to make changes to medical advance directive? - - - Yes (MAU/Ambulatory/Procedural Areas - Information given) - - -  Would patient like information on creating a medical advance directive? No - Patient declined No - Patient declined - - - - No - Guardian declined    Current Medications (verified) Outpatient Encounter Medications as of 05/05/2020  Medication Sig  . Cholecalciferol (VITAMIN D) 50 MCG (2000 UT) tablet Take 2,000 Units by mouth daily.  . cyanocobalamin (,VITAMIN B-12,) 1000 MCG/ML injection ADMINISTER 1  ML(1000 MCG) IN THE MUSCLE EVERY 30 DAYS  . gabapentin (NEURONTIN) 300 MG capsule Take 2 capsules (600 mg total) by mouth at bedtime AND 1 capsule (300 mg total) 2 (two) times daily.  Marland Kitchen levothyroxine (SYNTHROID) 100 MCG tablet Take 1 every third day in a 3 day cycle. BRAND NAME SYNTHROID  . omeprazole (PRILOSEC) 40 MG capsule TAKE 1 CAPSULE BY MOUTH DAILY IN THE MORNING BEFORE BREAKFAST  . rosuvastatin (CRESTOR) 10 MG tablet Take 1 tablet (10 mg total) by mouth daily.  Marland Kitchen SYNTHROID 88 MCG tablet TAKE 1 TABLET BY MOUTH ON EVERY FIRST AND SECOND DAY IN A 3 DAY CYCLE.  Marland Kitchen Syringe/Needle, Disp, (SYRINGE 3CC/25GX1") 25G X 1" 3 ML MISC 1 each by Does not apply route every 30 (thirty) days.  Marland Kitchen thiamine (VITAMIN B-1) 100 MG tablet Take 100 mg by mouth daily.  . traMADol (ULTRAM) 50 MG tablet TAKE 1 TABLET(50 MG) BY MOUTH EVERY 6 HOURS AS NEEDED FOR MODERATE PAIN  . [DISCONTINUED] baclofen (LIORESAL) 10 MG tablet Take 1 tablet (10 mg total) by mouth every 8 (eight) hours as needed for muscle spasms (Pain). (Patient not taking: Reported on 05/05/2020)   No facility-administered encounter medications on file as of 05/05/2020.    Allergies (verified) Prevacid [lansoprazole] and Doxycycline   History: Past Medical History:  Diagnosis Date  . Allergy    SEASONAL  . Arthritis   . Back pain   . GERD (gastroesophageal reflux disease)   . Hiatal hernia 03/16/2019  . History of tonsillectomy   . Hyperlipidemia 12/15/2016  . Hypothyroidism 12/15/2016  .  Osteopenia after menopause 03/22/2019   dexa 03/2019: T=-2.1 hip; Lspine excluded.   . Osteoporosis   . Sinus infection    Past Surgical History:  Procedure Laterality Date  . ADENOIDECTOMY    . ANTERIOR CERVICAL DECOMP/DISCECTOMY FUSION  2002  . COLONOSCOPY  10/06/2018  . DILATION AND CURETTAGE OF UTERUS     X2  . LEFT OOPHORECTOMY    . PARATHYROIDECTOMY    . POLYPECTOMY    . SHOULDER SURGERY    . TONSILLECTOMY     Family History  Problem  Relation Age of Onset  . Hyperlipidemia Mother   . Hypertension Mother   . Congestive Heart Failure Mother   . Heart disease Mother   . Cancer Father   . Diabetes Father   . Heart disease Father   . Hyperlipidemia Father   . Colon polyps Father   . Thyroid disease Sister   . Colon polyps Sister   . Drug abuse Brother   . Diabetes Son   . Colon cancer Neg Hx   . Esophageal cancer Neg Hx   . Stomach cancer Neg Hx   . Rectal cancer Neg Hx    Social History   Socioeconomic History  . Marital status: Married    Spouse name: Not on file  . Number of children: 3  . Years of education: 92  . Highest education level: Not on file  Occupational History  . Occupation: retired  Tobacco Use  . Smoking status: Former Research scientist (life sciences)  . Smokeless tobacco: Never Used  . Tobacco comment: Quit in 1985  Vaping Use  . Vaping Use: Never used  Substance and Sexual Activity  . Alcohol use: No  . Drug use: No  . Sexual activity: Yes  Other Topics Concern  . Not on file  Social History Narrative   Lives with husband      Lives in two story home      Right handed      Highest level of edu- GED      Retired      Previously lived in [place]: [place]   . Difficulty of Paying Living Expenses: Not hard at all  Food Insecurity: No Food Insecurity  . Worried About Charity fundraiser in the Last Year: Never true  . Ran Out of Food in the Last Year: Never true  Transportation Needs: No Transportation Needs  . Lack of Transportation (Medical): No  . Lack of Transportation (Non-Medical): No  Physical Activity: Insufficiently Active  . Days of Exercise per Week: 7 days  . Minutes of Exercise per Session: 10 min  Stress: No Stress Concern Present  . Feeling of Stress : Not at all  Social Connections: Unknown  . Frequency of Communication with Friends and Family: More than three times a week  . Frequency of Social Gatherings  with Friends and Family: Never  . Attends Religious Services: Not on file  . Active Member of Clubs or Organizations: No  . Attends Archivist Meetings: Never  . Marital Status: Married    Tobacco Counseling Counseling given: Not Answered Comment: Quit in 1985   Clinical Intake:  Pre-visit preparation completed: Yes  Pain : No/denies pain     BMI - recorded: 27.94 Nutritional Status: BMI 25 -29 Overweight Nutritional Risks: None Diabetes: No  How often do you need to have someone help you when you read instructions, pamphlets, or  other written materials from your doctor or pharmacy?: 1 - Never  Diabetic?No  Interpreter Needed?: No  Information entered by :: Charlott Rakes, LPN   Activities of Daily Living In your present state of health, do you have any difficulty performing the following activities: 05/05/2020 09/12/2019  Hearing? N N  Vision? N N  Difficulty concentrating or making decisions? N N  Walking or climbing stairs? N N  Dressing or bathing? N N  Doing errands, shopping? N N  Preparing Food and eating ? N -  Using the Toilet? N -  In the past six months, have you accidently leaked urine? N -  Do you have problems with loss of bowel control? N -  Managing your Medications? N -  Managing your Finances? N -  Housekeeping or managing your Housekeeping? N -  Some recent data might be hidden    Patient Care Team: Leamon Arnt, MD as PCP - General (Family Medicine) Alda Berthold, DO as Consulting Physician (Neurology) Ladene Artist, MD as Consulting Physician (Gastroenterology) Eunice Blase, MD as Consulting Physician (Sports Medicine) Caprice Beaver, DPM as Consulting Physician (Podiatry) Magnus Sinning, MD as Consulting Physician (Physical Medicine and Rehabilitation) Orvilla Fus, MD as Referring Physician (Neurology)  Indicate any recent Medical Services you may have received from other than [facility] providers in the past year  (date may be approximate).     Assessment:   This is a routine wellness examination for Latasha Mclaughlin.  Hearing/Vision screen  Hearing Screening   125Hz  250Hz  500Hz  1000Hz  2000Hz  3000Hz  4000Hz  6000Hz  8000Hz   Right ear:           Left ear:           Comments: Pt denies any hearing loss   Vision Screening Comments: Pt goes to [facility] for annual eye exams  Dietary issues and exercise activities discussed: Current Exercise Habits: Home exercise routine, Type of exercise: walking, Time (Minutes): 15, Frequency (Times/Week): 7, Weekly Exercise (Minutes/Week): 105  Goals    . Patient Stated     Lose 20lbs       Depression Screen PHQ 2/9 Scores 05/05/2020 04/09/2020 03/16/2019 12/16/2017 12/15/2016  PHQ - 2 Score 0 0 0 0 0    Fall Risk Fall Risk  05/05/2020 11/29/2019 09/12/2019 04/03/2019 03/28/2019  Falls in the past year? 0 1 - 1 1  Comment - - - - Emmi Telephone Survey: data to providers prior to load  Number falls in past yr: 0 0 0 0 1  Comment - - - - Emmi Telephone Survey Actual Response = 1  Injury with Fall? 0 1 0 1 1  Risk for fall due to : Impaired vision;Impaired balance/gait;Impaired mobility Other (Comment) - - -  Risk for fall due to: Comment - foot slipped - - -  Follow up Falls prevention discussed - - - -    FALL RISK PREVENTION PERTAINING TO THE HOME:  Any stairs in or around the home? Yes  If so, are there any without handrails? No  Home free of loose throw rugs in walkways, pet beds, electrical cords, etc? Yes  Adequate lighting in your home to reduce risk of falls? Yes   ASSISTIVE DEVICES UTILIZED TO PREVENT FALLS:  Life alert? No  Use of a cane, walker or w/c? No  Grab bars in the bathroom? Yes  Shower chair or bench in shower? No  Elevated toilet seat or a handicapped toilet? No   TIMED UP AND GO:  Was the  test performed? No .    Cognitive Function:     6CIT Screen 05/05/2020  What Year? 0 points  What month? 0 points  Count back from 20 0 points  Months  in reverse 0 points  Repeat phrase 0 points    Immunizations Immunization History  Administered Date(s) Administered  . Fluad Quad(high Dose 65+) 03/16/2019, 04/09/2020  . Influenza, High Dose Seasonal PF 01/19/2017, 01/27/2018  . Influenza-Unspecified 03/19/2015, 01/21/2016, 01/27/2018  . Pneumococcal Conjugate-13 01/21/2016      Flu Vaccine status: Up to date Done 04/09/20  Pneumococcal vaccine status: Up to date  Covid-19 vaccine status: Declined, Education has been provided regarding the importance of this vaccine but patient still declined. Advised may receive this vaccine at local pharmacy or Health Dept.or vaccine clinic. Aware to provide a copy of the vaccination record if obtained from local pharmacy or Health Dept. Verbalized acceptance and understanding.  Qualifies for Shingles Vaccine? Yes   Zostavax completed No   Shingrix Completed?: No.    Education has been provided regarding the importance of this vaccine. Patient has been advised to call insurance company to determine out of pocket expense if they have not yet received this vaccine. Advised may also receive vaccine at local pharmacy or Health Dept. Verbalized acceptance and understanding.  Screening Tests Health Maintenance  Topic Date Due  . COVID-19 Vaccine (1) Never done  . DEXA SCAN  03/21/2021  . MAMMOGRAM  04/16/2021  . COLONOSCOPY (Pts 45-108yrs Insurance coverage will need to be confirmed)  10/06/2023  . INFLUENZA VACCINE  Completed  . Hepatitis C Screening  Completed  . PNA vac Low Risk Adult  Completed    Health Maintenance  Health Maintenance Due  Topic Date Due  . COVID-19 Vaccine (1) Never done    Colorectal cancer screening: Type of screening: Colonoscopy. Completed 10/06/18. Repeat every 5 years  Mammogram status: Completed 04/16/20. Repeat every year  Bone Density status: Completed 03/22/19. Results reflect: Bone density results: OSTEOPENIA. Repeat every 2 years.    Additional  Screening:  Hepatitis C Screening: Completed 06/17/17  Vision Screening: Recommended annual ophthalmology exams for early detection of glaucoma and other disorders of the eye. Is the patient up to date with their annual eye exam?  Yes  Who is the provider or what is the name of the office in which the patient attends annual eye exams? [facility]  Dental Screening: Recommended annual dental exams for proper oral hygiene  Community Resource Referral / Chronic Care Management: CRR required this visit?  No   CCM required this visit?  No      Plan:     I have personally reviewed and noted the following in the patient's chart:   . Medical and social history . Use of alcohol, tobacco or illicit drugs  . Current medications and supplements . Functional ability and status . Nutritional status . Physical activity . Advanced directives . List of other physicians . Hospitalizations, surgeries, and ER visits in previous 12 months . Vitals . Screenings to include cognitive, depression, and falls . Referrals and appointments  In addition, I have reviewed and discussed with patient certain preventive protocols, quality metrics, and best practice recommendations. A written personalized care plan for preventive services as well as general preventive health recommendations were provided to patient.     Willette Brace, LPN   [id number]   Nurse Notes: None

## 2020-05-05 NOTE — Patient Instructions (Addendum)
[name] , Thank you for taking time to come for your Medicare Wellness Visit. I appreciate your ongoing commitment to your health goals. Please review the following plan we discussed and let me know if I can assist you in the future.   Screening recommendations/referrals: Colonoscopy: Done 10/06/18 Mammogram: Done 04/16/20 Bone Density: Done 03/22/19 Recommended yearly ophthalmology/optometry visit for glaucoma screening and checkup Recommended yearly dental visit for hygiene and checkup  Vaccinations: Influenza vaccine: Done 04/09/20 Pneumococcal vaccine: Done 01/21/16 PCV13 Tdap vaccine: Not a candidate Shingles vaccine: Shingrix discussed. Please contact your pharmacy for coverage information.    Covid-19:Declined and discussed  Advanced directives: Advance directive discussed with you today. Even though you declined this today please call our office should you change your mind and we can give you the proper paperwork for you to fill out.  Conditions/risks identified: Lose 20lbs   Next appointment: Follow up in one year for your annual wellness visit    Preventive Care 65 Years and Older, Female Preventive care refers to lifestyle choices and visits with your health care provider that can promote health and wellness. What does preventive care include?  A yearly physical exam. This is also called an annual well check.  Dental exams once or twice a year.  Routine eye exams. Ask your health care provider how often you should have your eyes checked.  Personal lifestyle choices, including:  Daily care of your teeth and gums.  Regular physical activity.  Eating a healthy diet.  Avoiding tobacco and drug use.  Limiting alcohol use.  Practicing safe sex.  Taking low-dose aspirin every day.  Taking vitamin and mineral supplements as recommended by your health care provider. What happens during an annual well check? The services and screenings done by your health care  provider during your annual well check will depend on your age, overall health, lifestyle risk factors, and family history of disease. Counseling  Your health care provider may ask you questions about your:  Alcohol use.  Tobacco use.  Drug use.  Emotional well-being.  Home and relationship well-being.  Sexual activity.  Eating habits.  History of falls.  Memory and ability to understand (cognition).  Work and work Astronomer.  Reproductive health. Screening  You may have the following tests or measurements:  Height, weight, and BMI.  Blood pressure.  Lipid and cholesterol levels. These may be checked every 5 years, or more frequently if you are over 45 years old.  Skin check.  Lung cancer screening. You may have this screening every year starting at age 61 if you have a 30-pack-year history of smoking and currently smoke or have quit within the past 15 years.  Fecal occult blood test (FOBT) of the stool. You may have this test every year starting at age 50.  Flexible sigmoidoscopy or colonoscopy. You may have a sigmoidoscopy every 5 years or a colonoscopy every 10 years starting at age 68.  Hepatitis C blood test.  Hepatitis B blood test.  Sexually transmitted disease (STD) testing.  Diabetes screening. This is done by checking your blood sugar (glucose) after you have not eaten for a while (fasting). You may have this done every 1-3 years.  Bone density scan. This is done to screen for osteoporosis. You may have this done starting at age 74.  Mammogram. This may be done every 1-2 years. Talk to your health care provider about how often you should have regular mammograms. Talk with your health care provider about your test results,  treatment options, and if necessary, the need for more tests. Vaccines  Your health care provider may recommend certain vaccines, such as:  Influenza vaccine. This is recommended every year.  Tetanus, diphtheria, and acellular  pertussis (Tdap, Td) vaccine. You may need a Td booster every 10 years.  Zoster vaccine. You may need this after age 46.  Pneumococcal 13-valent conjugate (PCV13) vaccine. One dose is recommended after age 85.  Pneumococcal polysaccharide (PPSV23) vaccine. One dose is recommended after age 66. Talk to your health care provider about which screenings and vaccines you need and how often you need them. This information is not intended to replace advice given to you by your health care provider. Make sure you discuss any questions you have with your health care provider. Document Released: 05/16/2015 Document Revised: 01/07/2016 Document Reviewed: 02/18/2015 Elsevier Interactive Patient Education  2017 [place] Prevention in the Home Falls can cause injuries. They can happen to people of all ages. There are many things you can do to make your home safe and to help prevent falls. What can I do on the outside of my home?  Regularly fix the edges of walkways and driveways and fix any cracks.  Remove anything that might make you trip as you walk through a door, such as a raised step or threshold.  Trim any bushes or trees on the path to your home.  Use bright outdoor lighting.  Clear any walking paths of anything that might make someone trip, such as rocks or tools.  Regularly check to see if handrails are loose or broken. Make sure that both sides of any steps have handrails.  Any raised decks and porches should have guardrails on the edges.  Have any leaves, snow, or ice cleared regularly.  Use sand or salt on walking paths during winter.  Clean up any spills in your garage right away. This includes oil or grease spills. What can I do in the bathroom?  Use night lights.  Install grab bars by the toilet and in the tub and shower. Do not use towel bars as grab bars.  Use non-skid mats or decals in the tub or shower.  If you need to sit down in the shower, use a plastic,  non-slip stool.  Keep the floor dry. Clean up any water that spills on the floor as soon as it happens.  Remove soap buildup in the tub or shower regularly.  Attach bath mats securely with double-sided non-slip rug tape.  Do not have throw rugs and other things on the floor that can make you trip. What can I do in the bedroom?  Use night lights.  Make sure that you have a light by your bed that is easy to reach.  Do not use any sheets or blankets that are too big for your bed. They should not hang down onto the floor.  Have a firm chair that has side arms. You can use this for support while you get dressed.  Do not have throw rugs and other things on the floor that can make you trip. What can I do in the kitchen?  Clean up any spills right away.  Avoid walking on wet floors.  Keep items that you use a lot in easy-to-reach places.  If you need to reach something above you, use a strong step stool that has a grab bar.  Keep electrical cords out of the way.  Do not use floor polish or wax that makes floors  slippery. If you must use wax, use non-skid floor wax.  Do not have throw rugs and other things on the floor that can make you trip. What can I do with my stairs?  Do not leave any items on the stairs.  Make sure that there are handrails on both sides of the stairs and use them. Fix handrails that are broken or loose. Make sure that handrails are as long as the stairways.  Check any carpeting to make sure that it is firmly attached to the stairs. Fix any carpet that is loose or worn.  Avoid having throw rugs at the top or bottom of the stairs. If you do have throw rugs, attach them to the floor with carpet tape.  Make sure that you have a light switch at the top of the stairs and the bottom of the stairs. If you do not have them, ask someone to add them for you. What else can I do to help prevent falls?  Wear shoes that:  Do not have high heels.  Have rubber  bottoms.  Are comfortable and fit you well.  Are closed at the toe. Do not wear sandals.  If you use a stepladder:  Make sure that it is fully opened. Do not climb a closed stepladder.  Make sure that both sides of the stepladder are locked into place.  Ask someone to hold it for you, if possible.  Clearly mark and make sure that you can see:  Any grab bars or handrails.  First and last steps.  Where the edge of each step is.  Use tools that help you move around (mobility aids) if they are needed. These include:  Canes.  Walkers.  Scooters.  Crutches.  Turn on the lights when you go into a dark area. Replace any light bulbs as soon as they burn out.  Set up your furniture so you have a clear path. Avoid moving your furniture around.  If any of your floors are uneven, fix them.  If there are any pets around you, be aware of where they are.  Review your medicines with your doctor. Some medicines can make you feel dizzy. This can increase your [place] of falling. Ask your doctor what other things that you can do to help prevent falls. This information is not intended to replace advice given to you by your health care provider. Make sure you discuss any questions you have with your health care provider. Document Released: 02/13/2009 Document Revised: 09/25/2015 Document Reviewed: 05/24/2014 Elsevier Interactive Patient Education  2017 [employer].

## 2020-05-29 ENCOUNTER — Other Ambulatory Visit: Payer: Self-pay | Admitting: Family Medicine

## 2020-05-30 ENCOUNTER — Other Ambulatory Visit: Payer: Self-pay | Admitting: Family Medicine

## 2020-05-30 DIAGNOSIS — R202 Paresthesia of skin: Secondary | ICD-10-CM | POA: Diagnosis not present

## 2020-05-30 DIAGNOSIS — R208 Other disturbances of skin sensation: Secondary | ICD-10-CM | POA: Diagnosis not present

## 2020-05-30 DIAGNOSIS — R209 Unspecified disturbances of skin sensation: Secondary | ICD-10-CM | POA: Diagnosis not present

## 2020-05-30 DIAGNOSIS — G629 Polyneuropathy, unspecified: Secondary | ICD-10-CM | POA: Diagnosis not present

## 2020-07-10 ENCOUNTER — Telehealth: Payer: Self-pay | Admitting: Neurology

## 2020-07-10 ENCOUNTER — Telehealth: Payer: Self-pay

## 2020-07-10 NOTE — Telephone Encounter (Signed)
Please inform pt that we have received the procedure note from her skin biopsy, but the actual results from biopsy are not available. She would need to contact [facility].

## 2020-07-10 NOTE — Telephone Encounter (Signed)
Called patient and informed her of results and recommendations from Dr. Posey Pronto. Patient stated that [facility] had informed her to follow up with Dr. Posey Pronto. Informed patient that I would send Dr. Posey Pronto a message letting her know and to ask for further reccomendations. Patient is aware that Dr. Posey Pronto will be out of the office and it may be a few days to hear back from [place]. Patient verbalized understanding.

## 2020-07-10 NOTE — Telephone Encounter (Signed)
Called patient and informed her that Dr. Posey Pronto stated it was ok to f/u with her. Next available appointment ok because it is not urgent. Patient was transferred to front to schedule f/u.

## 2020-07-10 NOTE — Telephone Encounter (Signed)
OK to schedule follow-up, next available.

## 2020-07-10 NOTE — Telephone Encounter (Signed)
Patient has been informed via Mychart  

## 2020-07-10 NOTE — Telephone Encounter (Signed)
Patients skin biopsy results have been received and can be located in patients media file.

## 2020-07-10 NOTE — Telephone Encounter (Signed)
Skin biopsy results are normal. Please inform patient that she does not have neuropathy as both her skin biopsy and EMG results are normal.  We referred pt to [facility] as second opinion, recommend follow-up with them.

## 2020-07-18 ENCOUNTER — Other Ambulatory Visit: Payer: Self-pay

## 2020-07-18 ENCOUNTER — Encounter: Payer: Self-pay | Admitting: Family Medicine

## 2020-07-18 ENCOUNTER — Ambulatory Visit (INDEPENDENT_AMBULATORY_CARE_PROVIDER_SITE_OTHER): Payer: Medicare Other | Admitting: Family Medicine

## 2020-07-18 VITALS — BP 140/84 | HR 77 | Temp 97.6°F | Ht 66.0 in | Wt 174.4 lb

## 2020-07-18 DIAGNOSIS — E039 Hypothyroidism, unspecified: Secondary | ICD-10-CM | POA: Diagnosis not present

## 2020-07-18 DIAGNOSIS — M792 Neuralgia and neuritis, unspecified: Secondary | ICD-10-CM

## 2020-07-18 DIAGNOSIS — G608 Other hereditary and idiopathic neuropathies: Secondary | ICD-10-CM | POA: Diagnosis not present

## 2020-07-18 DIAGNOSIS — R202 Paresthesia of skin: Secondary | ICD-10-CM

## 2020-07-18 DIAGNOSIS — R030 Elevated blood-pressure reading, without diagnosis of hypertension: Secondary | ICD-10-CM

## 2020-07-18 LAB — TSH: TSH: 0.27 u[IU]/mL — ABNORMAL LOW (ref 0.35–4.50)

## 2020-07-18 MED ORDER — SYNTHROID 88 MCG PO TABS: ORAL_TABLET | ORAL | 3 refills | Status: DC

## 2020-07-18 MED ORDER — OMEPRAZOLE 40 MG PO CPDR: 40.0000 mg | DELAYED_RELEASE_CAPSULE | Freq: Every day | ORAL | 11 refills | Status: DC | PRN

## 2020-07-18 MED ORDER — SYNTHROID 100 MCG PO TABS: 100.0000 ug | ORAL_TABLET | ORAL | Status: DC

## 2020-07-18 NOTE — Patient Instructions (Signed)
Please return in December for your annual complete physical; please come fasting.  I will release your lab results to you on your MyChart account with further instructions. Please reply with any questions.   If you have any questions or concerns, please don't hesitate to send me a message via MyChart or call the office at [phone]. Thank you for visiting with [place] today! It's our pleasure caring for you.

## 2020-07-18 NOTE — Progress Notes (Signed)
Subjective  CC:  Chief Complaint  Patient presents with  . Thyroid Problem    Feet and hands are always cold, sometimes feels very exhausted not an everyday thing.  . Pain Management    HPI: Latasha Mclaughlin is a 75 y.o. female who presents to the office today to address the problems listed above in the chief complaint.  . Hypothyroidism f/u: Latasha Mclaughlin is a 75 y.o. female who presents for follow up of hypothyroidism. Last TSH showed control was good, and thyroid supplement dose was adjusted for ease since she was on a complicated dosing regimen. She has done well with the change.  Current symptoms: intermittent fatigue . Patient denies diarrhea, heat / cold intolerance, nervousness, palpitations and weight changes. Symptoms have been intermittent.She has been compliant with the medication. Due for lab recheck.  Zenon Mayo: I reviewed notes from neurology.  She had a recent muscle biopsy.  Patient reports it was normal.  Neurologist here in town and at [facility] both feel her symptom complex is consistent with a small fiber neuropathy.  There may be other treatments to try.  She has follow-up with Dr. Posey Pronto later this month.  She is hesitant.  She still questions the diagnosis.  She has questions if her numbness and tingling related to blood flow problems. . History of whitecoat hypertensive response: I reviewed all to home blood pressure readings.  Fortunately they are all remain normal.  Assessment  1. Acquired hypothyroidism   2. Paresthesia   3. Neuropathic pain   4. Idiopathic small fiber sensory neuropathy   5. 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 Pt is clinically euthyroid.  Small fiber sensory neuropathy: Counseling and education given.  This is her most likely diagnosis.  Discussed the clinical diagnosis and reassured her that this is most likely diagnosis even though her biopsy was  normal, discussed sensitivity and specificity is of different test.  I recommend her following up with neurology and consideration of different medications to treat her paresthesias because they are painful and bothersome.  Patient will consider it.  Whitecoat hypertensive response: Continue home monitoring.  Current home blood pressures are normal.  Follow up: December for complete physical. 10/08/2020  Orders Placed This Encounter  Procedures  . TSH   Meds ordered this encounter  Medications  . omeprazole (PRILOSEC) 40 MG capsule    Sig: Take 1 capsule (40 mg total) by mouth daily as needed.    Dispense:  30 capsule    Refill:  11  . SYNTHROID 100 MCG tablet    Sig: Take 1 tablet (100 mcg total) by mouth once a week. On Sundays  . SYNTHROID 88 MCG tablet    Sig: Take 1 tab daily Monday thru Saturday    Dispense:  60 tablet    Refill:  3      I reviewed the patients updated PMH, FH, and SocHx.    Patient Active Problem List   Diagnosis Date Noted  . Colon polyp 11/18/2017    Priority: High  . Hypothyroidism 12/15/2016    Priority: High  . Mixed hyperlipidemia 12/15/2016    Priority: High  . Osteopenia after menopause 03/22/2019    Priority: Medium  . GERD (gastroesophageal reflux disease) 03/16/2019    Priority: Medium  . Hiatal hernia 03/16/2019    Priority: Medium  . Vitamin B12 deficiency 10/20/2018  Priority: Low  . History of hyperparathyroidism 01/08/2017    Priority: Low  . Paresthesia 01/05/2017    Priority: Low  . Idiopathic small fiber sensory neuropathy 07/18/2020  . White coat syndrome without diagnosis of hypertension 04/09/2020  . Neuropathic pain 09/12/2019   No outpatient medications have been marked as taking for the 07/18/20 encounter (Office Visit) with Leamon Arnt, MD.    Allergies: Patient is allergic to prevacid [lansoprazole] and doxycycline. Family History: Patient family history includes Cancer in her father; Colon polyps in her  father and sister; Congestive Heart Failure in her mother; Diabetes in her father and son; Drug abuse in her brother; Heart disease in her father and mother; Hyperlipidemia in her father and mother; Hypertension in her mother; Thyroid disease in her sister. Social History:  Patient  reports that she has quit smoking. She has never used smokeless tobacco. She reports that she does not drink alcohol and does not use drugs.  Review of Systems: Constitutional: Negative for fever malaise or anorexia Cardiovascular: negative for chest pain Respiratory: negative for SOB or persistent cough Gastrointestinal: negative for abdominal pain  Objective  Vitals: BP 140/84   Pulse 77   Temp 97.6 F (36.4 C) (Temporal)   Ht 5\' 6"  (1.676 m)   Wt 174 lb 6.4 oz (79.1 kg)   SpO2 97%   BMI 28.15 kg/m  General: no acute distress , A&Ox3 HEENT: PEERL, no proptosis or lid lag, conjunctiva normal, Oropharynx moist,neck is supple without goiter or thyromegaly or thyroid nodules Cardiovascular:  RRR without murmur or gallop. No peripheral edema, radial and pedal pulses +2 bilaterally Respiratory:  Good breath sounds bilaterally, CTAB with normal respiratory effort Skin:  Warm, no rashes Neuro: no tremor     Commons side effects, risks, benefits, and alternatives for medications and treatment plan prescribed today were discussed, and the patient expressed understanding of the given instructions. Patient is instructed to call or message via MyChart if he/she has any questions or concerns regarding our treatment plan. No barriers to understanding were identified. We discussed Red Flag symptoms and signs in detail. Patient expressed understanding regarding what to do in case of urgent or emergency type symptoms.   Medication list was reconciled, printed and provided to the patient in AVS. Patient instructions and summary information was reviewed with the patient as documented in the AVS. This note was prepared with  assistance of Dragon voice recognition software. Occasional wrong-word or sound-a-like substitutions may have occurred due to the inherent limitations of voice recognition software

## 2020-07-20 ENCOUNTER — Other Ambulatory Visit: Payer: Self-pay | Admitting: Family Medicine

## 2020-07-20 DIAGNOSIS — E538 Deficiency of other specified B group vitamins: Secondary | ICD-10-CM

## 2020-07-21 ENCOUNTER — Other Ambulatory Visit: Payer: Self-pay

## 2020-07-21 DIAGNOSIS — E039 Hypothyroidism, unspecified: Secondary | ICD-10-CM

## 2020-07-21 DIAGNOSIS — E538 Deficiency of other specified B group vitamins: Secondary | ICD-10-CM

## 2020-07-21 MED ORDER — "BD INTEGRA SYRINGE 25G X 1"" 3 ML MISC": 1 refills | Status: DC

## 2020-07-21 MED ORDER — CYANOCOBALAMIN 1000 MCG/ML IJ SOLN: INTRAMUSCULAR | 3 refills | Status: DC

## 2020-07-21 MED ORDER — SYNTHROID 88 MCG PO TABS: ORAL_TABLET | ORAL | 0 refills | Status: DC

## 2020-07-21 NOTE — Progress Notes (Signed)
Please call patient: I have reviewed his/her lab results. Thyroid test is close to ok. Recommen continuing current dosing (8mcg m-sat and 174mcg on sundays.) will recheck again in 3 months. Please schedule lab visit. If TSH is worsening, then she may be ok to decrease to 88 daily. Thanks.

## 2020-07-28 ENCOUNTER — Ambulatory Visit: Payer: Medicare Other | Admitting: Neurology

## 2020-08-20 ENCOUNTER — Ambulatory Visit (INDEPENDENT_AMBULATORY_CARE_PROVIDER_SITE_OTHER): Payer: Medicare Other | Admitting: Physician Assistant

## 2020-08-20 ENCOUNTER — Encounter: Payer: Self-pay | Admitting: Physician Assistant

## 2020-08-20 VITALS — BP 128/78 | HR 88 | Ht 64.5 in | Wt 173.0 lb

## 2020-08-20 DIAGNOSIS — K449 Diaphragmatic hernia without obstruction or gangrene: Secondary | ICD-10-CM

## 2020-08-20 DIAGNOSIS — R1012 Left upper quadrant pain: Secondary | ICD-10-CM

## 2020-08-20 DIAGNOSIS — R1013 Epigastric pain: Secondary | ICD-10-CM

## 2020-08-20 MED ORDER — GLYCOPYRROLATE 2 MG PO TABS: 2.0000 mg | ORAL_TABLET | Freq: Two times a day (BID) | ORAL | 3 refills | Status: DC

## 2020-08-20 MED ORDER — OMEPRAZOLE 20 MG PO CPDR: 20.0000 mg | DELAYED_RELEASE_CAPSULE | Freq: Every morning | ORAL | 6 refills | Status: DC

## 2020-08-20 NOTE — Progress Notes (Signed)
Subjective:    Patient ID: Latasha Mclaughlin, female    DOB: 08/28/1945, 75 y.o.   MRN: [id number]  HPI Latasha Mclaughlin is a pleasant 75 year old female, established with Dr. Fuller Plan, who was last seen in September 2020 by myself.  At that time she was having intermittent epigastric pain, and some left upper quadrant pain that was felt possibly to be radicular in nature. She underwent CT of the abdomen pelvis in October 2020 which showed a normal-appearing gallbladder moderate sized hiatal hernia and was otherwise unremarkable. Comes in today stating that she has been having the same pain over the past year and a half.  She describes a burning type pain in the epigastrium and left upper quadrant that is fairly constant, some days worse than others.  She feels that pain is exacerbated at times about an hour after eating.  She does not feel that her pain changes with position or movement.  Bowel movements have been normal, no melena or hematochezia.  She has a prescription for tramadol by her PCP which she says she uses a couple of times a week for this pain.  She does have some radiation into her back.  Occasional nausea without vomiting.  Weight has been stable.  She has history of GERD but says she has not been having much symptoms over the past several months and has been only using a PPI on a as needed basis. She did have thoracic spine MRI in August 2021.  This showed a small superiorly migrating central disc extrusion at T11-12 causing mild flattening of the anterior contour of the spinal cord without cord signal abnormality and a right paracentral disc protrusion at T7-8 causing mild flattening of the anterior cord without significant spinal canal stenosis or cord abnormality.  Last EGD was done in June 2020 with finding of a 6 cm hiatal hernia distal stricture which was traversed without difficulty and not dilated.  Colonoscopy June 2020 with finding of a 12 mm lipoma in the ascending colon 2 polyps were removed  7 mm in size, and path was consistent with tubular adenomas.  She was indicated for 3-year interval follow-up.  Review of Systems Pertinent positive and negative review of systems were noted in the above HPI section.  All other review of systems was otherwise negative.  Outpatient Encounter Medications as of 08/20/2020  Medication Sig  . Cholecalciferol (VITAMIN D) 50 MCG (2000 UT) tablet Take 2,000 Units by mouth daily.  . cyanocobalamin (,VITAMIN B-12,) 1000 MCG/ML injection ADMINISTER 1 ML(1000 MCG) IN THE MUSCLE EVERY 30 DAYS  . esomeprazole (NEXIUM) 20 MG packet Take 20 mg by mouth as needed.  . gabapentin (NEURONTIN) 300 MG capsule TAKE 1 CAPSULE(300 MG) BY MOUTH THREE TIMES DAILY  . glycopyrrolate (ROBINUL) 2 MG tablet Take 1 tablet (2 mg total) by mouth 2 (two) times daily.  Marland Kitchen omeprazole (PRILOSEC) 20 MG capsule Take 1 capsule (20 mg total) by mouth every morning.  . rosuvastatin (CRESTOR) 10 MG tablet TAKE 1 TABLET(10 MG) BY MOUTH DAILY  . SYNTHROID 100 MCG tablet Take 1 tablet (100 mcg total) by mouth once a week. On Sundays  . SYNTHROID 88 MCG tablet Take 1 tab daily Monday thru Saturday  . SYRINGE-NEEDLE, DISP, 3 ML (BD INTEGRA SYRINGE) 25G X 1" 3 ML MISC USE ONCE MONTHLY  . thiamine (VITAMIN B-1) 100 MG tablet Take 100 mg by mouth daily.  . traMADol (ULTRAM) 50 MG tablet TAKE 1 TABLET(50 MG) BY MOUTH EVERY 6 HOURS  AS NEEDED FOR MODERATE PAIN  . [DISCONTINUED] omeprazole (PRILOSEC) 40 MG capsule Take 1 capsule (40 mg total) by mouth daily as needed.   No facility-administered encounter medications on file as of 08/20/2020.   Allergies  Allergen Reactions  . Prevacid [Lansoprazole] Rash  . Doxycycline Other (See Comments)    Burning    Patient Active Problem List   Diagnosis Date Noted  . Idiopathic small fiber sensory neuropathy 07/18/2020  . White coat syndrome without diagnosis of hypertension 04/09/2020  . Neuropathic pain 09/12/2019  . Osteopenia after menopause  03/22/2019  . GERD (gastroesophageal reflux disease) 03/16/2019  . Hiatal hernia 03/16/2019  . Vitamin B12 deficiency 10/20/2018  . Colon polyp 11/18/2017  . History of hyperparathyroidism 01/08/2017  . Paresthesia 01/05/2017  . Hypothyroidism 12/15/2016  . Mixed hyperlipidemia 12/15/2016   Social History   Socioeconomic History  . Marital status: Married    Spouse name: Not on file  . Number of children: 3  . Years of education: 42  . Highest education level: Not on file  Occupational History  . Occupation: retired  Tobacco Use  . Smoking status: Former Research scientist (life sciences)  . Smokeless tobacco: Never Used  . Tobacco comment: Quit in 1985  Vaping Use  . Vaping Use: Never used  Substance and Sexual Activity  . Alcohol use: No  . Drug use: No  . Sexual activity: Yes  Other Topics Concern  . Not on file  Social History Narrative   Lives with husband      Lives in two story home      Right handed      Highest level of edu- GED      Retired      Previously lived in [place] Strain: [place]   . Difficulty of Paying Living Expenses: Not hard at all  Food Insecurity: No Food Insecurity  . Worried About Charity fundraiser in the Last Year: Never true  . Ran Out of Food in the Last Year: Never true  Transportation Needs: No Transportation Needs  . Lack of Transportation (Medical): No  . Lack of Transportation (Non-Medical): No  Physical Activity: Insufficiently Active  . Days of Exercise per Week: 7 days  . Minutes of Exercise per Session: 10 min  Stress: No Stress Concern Present  . Feeling of Stress : Not at all  Social Connections: Unknown  . Frequency of Communication with Friends and Family: More than three times a week  . Frequency of Social Gatherings with Friends and Family: Never  . Attends Religious Services: Not on file  . Active Member of Clubs or Organizations: No  . Attends Archivist  Meetings: Never  . Marital Status: Married  Human resources officer Violence: Not At Risk  . Fear of Current or Ex-Partner: No  . Emotionally Abused: No  . Physically Abused: No  . Sexually Abused: No    Ms. [name]'s family history includes Cancer in her father; Colon polyps in her father and sister; Congestive Heart Failure in her mother; Diabetes in her father and son; Drug abuse in her brother; Heart disease in her father and mother; Hyperlipidemia in her father and mother; Hypertension in her mother; Thyroid disease in her sister.      Objective:    Vitals:   08/20/20 1338  BP: 128/78  Pulse: 88  SpO2: 98%    Physical Exam Well-developed well-nourished older white female in no  acute distress.  Height, [id number], BMI29.24  HEENT; nontraumatic normocephalic, EOMI, PE R LA, sclera anicteric. Oropharynx; not examined today Neck; supple, no JVD Cardiovascular; regular rate and rhythm with S1-S2, no murmur rub or gallop Pulmonary; Clear bilaterally Abdomen; soft, , nondistended, she has some mild tenderness in the epigastrium and left upper quadrant around laterally, no palpable mass or hepatosplenomegaly, bowel sounds are active.  No chest wall tenderness, no costal margin tenderness Rectal; not done today Skin; benign exam, no jaundice rash or appreciable lesions Extremities; no clubbing cyanosis or edema skin warm and dry Neuro/Psych; alert and oriented x4, grossly nonfocal mood and affect appropriate       Assessment & Plan:   #36 75 year old female with 1-1/2-year history of burning type pain in the epigastrium and left upper quadrant, fairly constant varying in intensity and sometimes seems to be exacerbated about 1 hour postprandially.  No positional component. Etiology of pain is not entirely clear. Patient had CT imaging in October 2020 for the same pain which was unremarkable other than a moderate hiatal hernia. Thoracic spine MRI in August 2021 did show a central disc  protrusion T11-12.  I think it is possible that she is having radicular symptoms.  Rule out gastropathy, peptic ulcer disease, other intra-abdominal inflammatory process.  #2 large hiatal hernia #3 history of adenomatous colon polyps-up-to-date with colonoscopy last done June 2020 and indicated for interval follow-up June 2023 #4 hypothyroidism  Plan; start omeprazole 20 mg p.o. every morning to be taken regularly over the next 4 to 6 weeks Start trial of glycopyrrolate 2 mg p.o. twice daily.  Patient may start with 1 p.o. every morning. She will continue to use tramadol as needed Advised trial of moist heat I have asked her to call back in 3 to 4 weeks, if symptoms are persisting and she has not had any significant improvement with the above regimen, will repeat cross-sectional imaging of the abdomen / pelvis.  Varina Hulon Genia Harold PA-C 08/20/2020   Cc: Leamon Arnt, MD

## 2020-08-20 NOTE — Progress Notes (Signed)
Reviewed and agree with management plan.  Noach Calvillo T. Gershon Shorten, MD FACG [phone]  

## 2020-08-20 NOTE — Patient Instructions (Addendum)
It was a pleasure to see you today. Based on our discussion, I am providing you with my recommendations below:  RECOMMENDATION(S):    I have sent a prescription to your pharmacy for Omeprazole and Glycopyrrolate  Please call the office if your sumptoms do not improve within the next 3 weeks. We may need to consider a CT if no improvement.  BMI:  . If you are age 74 or older, your body mass index should be between 23-30. Your Body mass index is 29.24 kg/m. If this is out of the aforementioned range listed, please consider follow up with your Primary Care Provider.  Thank you for trusting me with your gastrointestinal care!    Nicoletta Ba, PA

## 2020-09-08 ENCOUNTER — Encounter: Payer: Self-pay | Admitting: Physical Medicine and Rehabilitation

## 2020-09-08 ENCOUNTER — Other Ambulatory Visit: Payer: Self-pay | Admitting: Physical Medicine and Rehabilitation

## 2020-09-08 DIAGNOSIS — G8929 Other chronic pain: Secondary | ICD-10-CM

## 2020-09-08 DIAGNOSIS — M545 Low back pain, unspecified: Secondary | ICD-10-CM

## 2020-09-08 MED ORDER — CYCLOBENZAPRINE HCL 10 MG PO TABS: 10.0000 mg | ORAL_TABLET | Freq: Three times a day (TID) | ORAL | 0 refills | Status: DC | PRN

## 2020-09-08 NOTE — Progress Notes (Signed)
Rx for flexeril for muscle pain/spasm in low back. If no help need PCP/Ortho or me follow up

## 2020-09-10 ENCOUNTER — Other Ambulatory Visit: Payer: Self-pay | Admitting: Physical Medicine and Rehabilitation

## 2020-09-10 MED ORDER — BACLOFEN 10 MG PO TABS: 10.0000 mg | ORAL_TABLET | Freq: Three times a day (TID) | ORAL | 0 refills | Status: DC | PRN

## 2020-09-10 NOTE — Progress Notes (Signed)
Switch flexeril to baclofen. No trouble with rx in the past.

## 2020-10-08 ENCOUNTER — Ambulatory Visit: Payer: Medicare Other | Admitting: Family Medicine

## 2020-10-20 ENCOUNTER — Other Ambulatory Visit (INDEPENDENT_AMBULATORY_CARE_PROVIDER_SITE_OTHER): Payer: Medicare Other

## 2020-10-20 ENCOUNTER — Other Ambulatory Visit: Payer: Self-pay

## 2020-10-20 DIAGNOSIS — E039 Hypothyroidism, unspecified: Secondary | ICD-10-CM | POA: Diagnosis not present

## 2020-10-20 LAB — TSH: TSH: 0.29 u[IU]/mL — ABNORMAL LOW (ref 0.35–4.50)

## 2020-10-21 ENCOUNTER — Encounter: Payer: Self-pay | Admitting: Family Medicine

## 2020-10-21 ENCOUNTER — Other Ambulatory Visit: Payer: Self-pay

## 2020-10-21 DIAGNOSIS — E039 Hypothyroidism, unspecified: Secondary | ICD-10-CM

## 2020-10-21 MED ORDER — SYNTHROID 88 MCG PO TABS: ORAL_TABLET | ORAL | 0 refills | Status: DC

## 2020-11-03 DIAGNOSIS — Z20822 Contact with and (suspected) exposure to covid-19: Secondary | ICD-10-CM | POA: Diagnosis not present

## 2020-11-21 ENCOUNTER — Other Ambulatory Visit: Payer: Self-pay | Admitting: Family Medicine

## 2020-11-21 DIAGNOSIS — E538 Deficiency of other specified B group vitamins: Secondary | ICD-10-CM

## 2020-11-27 ENCOUNTER — Encounter: Payer: Self-pay | Admitting: Family Medicine

## 2020-11-28 ENCOUNTER — Telehealth: Payer: Self-pay

## 2020-11-28 NOTE — Telephone Encounter (Signed)
Patient called back and said she would actually prefer a virtual visit instead, is this okay?

## 2020-11-28 NOTE — Telephone Encounter (Signed)
Patient is scheduled in office on Monday.

## 2020-11-28 NOTE — Telephone Encounter (Signed)
Patient is calling in stating she has been having a lot issues, and believes it is related to her thyroid. Wouldn't go in to detail just kept telling me that she has a lot of personal problems and wanted to get in to see Dr.Andy. No open slots the month of August, can we use same day to get her in?

## 2020-11-28 NOTE — Telephone Encounter (Signed)
OK to use same day?

## 2020-12-01 ENCOUNTER — Ambulatory Visit (INDEPENDENT_AMBULATORY_CARE_PROVIDER_SITE_OTHER): Payer: Medicare Other | Admitting: Family Medicine

## 2020-12-01 ENCOUNTER — Other Ambulatory Visit: Payer: Self-pay

## 2020-12-01 ENCOUNTER — Encounter: Payer: Self-pay | Admitting: Family Medicine

## 2020-12-01 VITALS — BP 158/82 | HR 62 | Temp 97.8°F | Wt 173.8 lb

## 2020-12-01 DIAGNOSIS — R030 Elevated blood-pressure reading, without diagnosis of hypertension: Secondary | ICD-10-CM | POA: Diagnosis not present

## 2020-12-01 DIAGNOSIS — G608 Other hereditary and idiopathic neuropathies: Secondary | ICD-10-CM | POA: Diagnosis not present

## 2020-12-01 DIAGNOSIS — Z8639 Personal history of other endocrine, nutritional and metabolic disease: Secondary | ICD-10-CM

## 2020-12-01 DIAGNOSIS — E039 Hypothyroidism, unspecified: Secondary | ICD-10-CM

## 2020-12-01 DIAGNOSIS — E538 Deficiency of other specified B group vitamins: Secondary | ICD-10-CM

## 2020-12-01 DIAGNOSIS — E212 Other hyperparathyroidism: Secondary | ICD-10-CM | POA: Diagnosis not present

## 2020-12-01 LAB — COMPREHENSIVE METABOLIC PANEL
ALT: 11 U/L (ref 0–35)
AST: 12 U/L (ref 0–37)
Albumin: 4.3 g/dL (ref 3.5–5.2)
Alkaline Phosphatase: 48 U/L (ref 39–117)
BUN: 10 mg/dL (ref 6–23)
CO2: 29 mEq/L (ref 19–32)
Calcium: 9.9 mg/dL (ref 8.4–10.5)
Chloride: 108 mEq/L (ref 96–112)
Creatinine, Ser: 0.91 mg/dL (ref 0.40–1.20)
GFR: 62.14 mL/min (ref >60.00)
Glucose, Bld: 100 mg/dL — ABNORMAL HIGH (ref 70–99)
Potassium: 4.5 mEq/L (ref 3.5–5.1)
Sodium: 143 mEq/L (ref 135–145)
Total Bilirubin: 1.2 mg/dL (ref 0.2–1.2)
Total Protein: 6.6 g/dL (ref 6.0–8.3)

## 2020-12-01 LAB — TSH: TSH: 0.38 u[IU]/mL (ref 0.35–5.50)

## 2020-12-01 LAB — VITAMIN B12: Vitamin B-12: 1147 pg/mL — ABNORMAL HIGH (ref 211–911)

## 2020-12-01 LAB — T3: T3, Total: 96 ng/dL (ref 76–181)

## 2020-12-01 LAB — VITAMIN D 25 HYDROXY (VIT D DEFICIENCY, FRACTURES): VITD: 54.78 ng/mL (ref 30.00–100.00)

## 2020-12-01 LAB — T4, FREE: Free T4: 1.11 ng/dL (ref 0.60–1.60)

## 2020-12-01 MED ORDER — GABAPENTIN 300 MG PO CAPS: 600.0000 mg | ORAL_CAPSULE | Freq: Three times a day (TID) | ORAL | 5 refills | Status: DC

## 2020-12-01 NOTE — Patient Instructions (Signed)
Please return in December for your annual complete physical; please come fasting.   I will release your lab results to you on your MyChart account with further instructions. Please reply with any questions.    Please increase the dose of the gabapentin to see if we can better control your neuropathy symptoms.  If you have any questions or concerns, please don't hesitate to send me a message via MyChart or call the office at [phone]. Thank you for visiting with [place] today! It's our pleasure caring for you.

## 2020-12-01 NOTE — Progress Notes (Signed)
Subjective  CC:  Chief Complaint  Patient presents with   Numbness    Bilateral legs/feet, ongoing for two years, but worsening over the past few months    Hypothyroidism    Wanting TSH rechecked     HPI: Latasha Mclaughlin is a 75 y.o. female who presents to the office today to address the problems listed above in the chief complaint. 75 year old female with chronic neuropathy symptoms, worsening in bilateral lower extremities.  Symptoms started back in 2020.  Started with left upper anterior intercostal neuropathy burning pain.  Was started on gabapentin.  She then noted neuropathy symptoms in bilateral lower extremities.  At that time she also had a COVID-like illness but was never tested.  She wonders if the 2 are related.  She since, has seen 2 neurologists.  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  She reports numbness and tingling in a bandlike sensation in bilateral feet up to her knees.  At times her toes will be very painful.  She takes tramadol only if needed.  I reviewed neurology notes from [facility] neurology and [facility].  She had extensive work-up including lumbar MRI and brain MRI.  However, her symptoms persist and both neurologist told her to follow-up with the other 1 apparently.  She is a bit frustrated. Hypothyroidism: We decreased her dose from 100 mcg daily to 88 mcg daily visit.  She is due for recheck.  She is tolerating this well. Stress: Admits she has more worry and anxiety symptoms.  She defers medication.  She would be open to behavioral strategies like mindfulness and meditation. History of hyperparathyroidism status post partial parathyroidectomy.  She wonders whether this is contributing to her lower extremity symptoms.  She would like it rechecked. [place] hypertension: She reports normal blood pressures at home remain. Vitamin B12 deficiency: Continues on supplementation.  Symptoms do not change with  normalization of B12 levels. Health maintenance: Eligible for COVID and shingles vaccinations.  She declines both.  Assessment  1. Acquired hypothyroidism   2. History of hyperparathyroidism   3. Idiopathic small fiber sensory neuropathy   4. Vitamin B12 deficiency   5. Other hyperparathyroidism ([place])    6. White coat syndrome without diagnosis of hypertension      Plan  Neuropathy: Difficult case.  No clear etiology identified.  Exam shows numbness.  Remainder of neuro exam has been normal.  Will titrate up gabapentin to see if she can tolerate 600 3 times daily.  Can consider Lyrica.  Could consider Cymbalta.  I am not sure if neurology has any other options of treatment.  She has not been given any. Hypothyroidism: Recheck levels today. Stress management ideas discussed History of hyperparathyroidism: Recheck levels today. Vitamin B12 deficiency: Recheck levels today.  Follow up: December for complete physical   Orders Placed This Encounter  Procedures   TSH   Comprehensive metabolic panel   VITAMIN D 25 Hydroxy (Vit-D Deficiency, Fractures)   Vitamin B12   T3   T4, free   PTH, intact (no Ca)   Meds ordered this encounter  Medications   gabapentin (NEURONTIN) 300 MG capsule    Sig: Take 2 capsules (600 mg total) by mouth 3 (three) times daily. Titrate up as discussed    Dispense:  270 capsule    Refill:  5      I reviewed the patients updated PMH, FH, and SocHx.    Patient Active Problem List  Diagnosis Date Noted   Colon polyp 11/18/2017    Priority: High   Hypothyroidism 12/15/2016    Priority: High   Mixed hyperlipidemia 12/15/2016    Priority: High   Osteopenia after menopause 03/22/2019    Priority: Medium   GERD (gastroesophageal reflux disease) 03/16/2019    Priority: Medium   Hiatal hernia 03/16/2019    Priority: Medium   Vitamin B12 deficiency 10/20/2018    Priority: Low   History of hyperparathyroidism 01/08/2017    Priority: Low    Paresthesia 01/05/2017    Priority: Low   Idiopathic small fiber sensory neuropathy 07/18/2020   White coat syndrome without diagnosis of hypertension 04/09/2020   Neuropathic pain 09/12/2019   Current Meds  Medication Sig   baclofen (LIORESAL) 10 MG tablet Take 1 tablet (10 mg total) by mouth every 8 (eight) hours as needed for muscle spasms (Pain).   Cholecalciferol (VITAMIN D) 50 MCG (2000 UT) tablet Take 2,000 Units by mouth daily.   cyanocobalamin (,VITAMIN B-12,) 1000 MCG/ML injection ADMINISTER 1 ML(1000 MCG) IN THE MUSCLE EVERY 30 DAYS   esomeprazole (NEXIUM) 20 MG packet Take 20 mg by mouth as needed.   omeprazole (PRILOSEC) 20 MG capsule Take 1 capsule (20 mg total) by mouth every morning.   rosuvastatin (CRESTOR) 10 MG tablet TAKE 1 TABLET(10 MG) BY MOUTH DAILY   SYNTHROID 88 MCG tablet Take 1 tab daily   SYRINGE-NEEDLE, DISP, 3 ML (BD INTEGRA SYRINGE) 25G X 1" 3 ML MISC USE ONCE MONTHLY   thiamine (VITAMIN B-1) 100 MG tablet Take 100 mg by mouth daily.   traMADol (ULTRAM) 50 MG tablet TAKE 1 TABLET(50 MG) BY MOUTH EVERY 6 HOURS AS NEEDED FOR MODERATE PAIN   [DISCONTINUED] gabapentin (NEURONTIN) 300 MG capsule TAKE 1 CAPSULE(300 MG) BY MOUTH THREE TIMES DAILY    Allergies: Patient is allergic to prevacid [lansoprazole] and doxycycline. Family History: Patient family history includes Cancer in her father; Colon polyps in her father and sister; Congestive Heart Failure in her mother; Diabetes in her father and son; Drug abuse in her brother; Heart disease in her father and mother; Hyperlipidemia in her father and mother; Hypertension in her mother; Thyroid disease in her sister. Social History:  Patient  reports that she has quit smoking. She has never used smokeless tobacco. She reports that she does not drink alcohol and does not use drugs.  Review of Systems: Constitutional: Negative for fever malaise or anorexia Cardiovascular: negative for chest pain Respiratory: negative  for SOB or persistent cough Gastrointestinal: negative for abdominal pain  Objective  Vitals: BP (!) 158/82   Pulse 62   Temp 97.8 F (36.6 C) (Temporal)   Wt 173 lb 12.8 oz (78.8 kg)   SpO2 98%   BMI 29.37 kg/m  General: no acute distress , A&Ox3 HEENT: PEERL, conjunctiva normal, neck is supple Cardiovascular:  RRR without murmur or gallop.  Respiratory:  Good breath sounds bilaterally, CTAB with normal respiratory effort Skin:  Warm, no rashes Neuro: Numbness to light touch bilateral lower extremities to knees. +2 pedal pulses    Commons side effects, risks, benefits, and alternatives for medications and treatment plan prescribed today were discussed, and the patient expressed understanding of the given instructions. Patient is instructed to call or message via MyChart if he/she has any questions or concerns regarding our treatment plan. No barriers to understanding were identified. We discussed Red Flag symptoms and signs in detail. Patient expressed understanding regarding what to do in case of urgent  or emergency type symptoms.  Medication list was reconciled, printed and provided to the patient in AVS. Patient instructions and summary information was reviewed with the patient as documented in the AVS. This note was prepared with assistance of Dragon voice recognition software. Occasional wrong-word or sound-a-like substitutions may have occurred due to the inherent limitations of voice recognition software  This visit occurred during the SARS-CoV-2 public health emergency.  Safety protocols were in place, including screening questions prior to the visit, additional usage of staff PPE, and extensive cleaning of exam room while observing appropriate contact time as indicated for disinfecting solutions.

## 2020-12-02 LAB — PARATHYROID HORMONE, INTACT (NO CA): PTH: 30 pg/mL (ref 16–77)

## 2020-12-15 ENCOUNTER — Other Ambulatory Visit: Payer: Medicare Other

## 2021-01-01 ENCOUNTER — Encounter: Payer: Self-pay | Admitting: Physical Medicine and Rehabilitation

## 2021-01-12 ENCOUNTER — Other Ambulatory Visit: Payer: Self-pay | Admitting: Physical Medicine and Rehabilitation

## 2021-01-12 NOTE — Telephone Encounter (Signed)
Please advise 

## 2021-01-23 ENCOUNTER — Other Ambulatory Visit: Payer: Self-pay | Admitting: Family Medicine

## 2021-02-14 DIAGNOSIS — Z23 Encounter for immunization: Secondary | ICD-10-CM | POA: Diagnosis not present

## 2021-03-09 DIAGNOSIS — Z20828 Contact with and (suspected) exposure to other viral communicable diseases: Secondary | ICD-10-CM | POA: Diagnosis not present

## 2021-03-31 ENCOUNTER — Other Ambulatory Visit: Payer: Self-pay | Admitting: Physician Assistant

## 2021-03-31 DIAGNOSIS — R1012 Left upper quadrant pain: Secondary | ICD-10-CM

## 2021-04-10 ENCOUNTER — Encounter: Payer: Self-pay | Admitting: Family Medicine

## 2021-04-10 ENCOUNTER — Ambulatory Visit (INDEPENDENT_AMBULATORY_CARE_PROVIDER_SITE_OTHER): Payer: Medicare Other | Admitting: Family Medicine

## 2021-04-10 ENCOUNTER — Other Ambulatory Visit: Payer: Self-pay

## 2021-04-10 ENCOUNTER — Ambulatory Visit: Payer: Medicare Other | Admitting: Neurology

## 2021-04-10 VITALS — BP 122/80 | HR 61 | Temp 98.0°F | Ht 65.0 in | Wt 173.2 lb

## 2021-04-10 DIAGNOSIS — E538 Deficiency of other specified B group vitamins: Secondary | ICD-10-CM | POA: Diagnosis not present

## 2021-04-10 DIAGNOSIS — Z23 Encounter for immunization: Secondary | ICD-10-CM

## 2021-04-10 DIAGNOSIS — G608 Other hereditary and idiopathic neuropathies: Secondary | ICD-10-CM | POA: Diagnosis not present

## 2021-04-10 DIAGNOSIS — M792 Neuralgia and neuritis, unspecified: Secondary | ICD-10-CM | POA: Diagnosis not present

## 2021-04-10 DIAGNOSIS — E782 Mixed hyperlipidemia: Secondary | ICD-10-CM | POA: Diagnosis not present

## 2021-04-10 DIAGNOSIS — E039 Hypothyroidism, unspecified: Secondary | ICD-10-CM

## 2021-04-10 DIAGNOSIS — Z780 Asymptomatic menopausal state: Secondary | ICD-10-CM | POA: Diagnosis not present

## 2021-04-10 DIAGNOSIS — Z1231 Encounter for screening mammogram for malignant neoplasm of breast: Secondary | ICD-10-CM

## 2021-04-10 DIAGNOSIS — M8580 Other specified disorders of bone density and structure, unspecified site: Secondary | ICD-10-CM

## 2021-04-10 LAB — LIPID PANEL
Cholesterol: 133 mg/dL (ref 0–200)
HDL: 52.4 mg/dL (ref >39.00)
LDL Cholesterol: 61 mg/dL (ref 0–99)
NonHDL: 80.15
Total CHOL/HDL Ratio: 3
Triglycerides: 98 mg/dL (ref 0.0–149.0)
VLDL: 19.6 mg/dL (ref 0.0–40.0)

## 2021-04-10 LAB — COMPREHENSIVE METABOLIC PANEL
ALT: 14 U/L (ref 0–35)
AST: 18 U/L (ref 0–37)
Albumin: 4.3 g/dL (ref 3.5–5.2)
Alkaline Phosphatase: 50 U/L (ref 39–117)
BUN: 10 mg/dL (ref 6–23)
CO2: 28 mEq/L (ref 19–32)
Calcium: 10.3 mg/dL (ref 8.4–10.5)
Chloride: 108 mEq/L (ref 96–112)
Creatinine, Ser: 0.9 mg/dL (ref 0.40–1.20)
GFR: 62.81 mL/min (ref >60.00)
Glucose, Bld: 100 mg/dL — ABNORMAL HIGH (ref 70–99)
Potassium: 4.6 mEq/L (ref 3.5–5.1)
Sodium: 143 mEq/L (ref 135–145)
Total Bilirubin: 1 mg/dL (ref 0.2–1.2)
Total Protein: 6.9 g/dL (ref 6.0–8.3)

## 2021-04-10 LAB — CBC WITH DIFFERENTIAL/PLATELET
Basophils Absolute: 0 10*3/uL (ref 0.0–0.1)
Basophils Relative: 0.5 % (ref 0.0–3.0)
Eosinophils Absolute: 0.1 10*3/uL (ref 0.0–0.7)
Eosinophils Relative: 1.9 % (ref 0.0–5.0)
HCT: 42.5 % (ref 36.0–46.0)
Hemoglobin: 14.3 g/dL (ref 12.0–15.0)
Lymphocytes Relative: 31.7 % (ref 12.0–46.0)
Lymphs Abs: 1.6 10*3/uL (ref 0.7–4.0)
MCHC: 33.7 g/dL (ref 30.0–36.0)
MCV: 92.5 fl (ref 78.0–100.0)
Monocytes Absolute: 0.5 10*3/uL (ref 0.1–1.0)
Monocytes Relative: 8.9 % (ref 3.0–12.0)
Neutro Abs: 2.9 10*3/uL (ref 1.4–7.7)
Neutrophils Relative %: 57 % (ref 43.0–77.0)
Platelets: 230 10*3/uL (ref 150.0–400.0)
RBC: 4.6 Mil/uL (ref 3.87–5.11)
RDW: 13.1 % (ref 11.5–15.5)
WBC: 5 10*3/uL (ref 4.0–10.5)

## 2021-04-10 LAB — TSH: TSH: 0.34 u[IU]/mL — ABNORMAL LOW (ref 0.35–5.50)

## 2021-04-10 LAB — VITAMIN B12: Vitamin B-12: 491 pg/mL (ref 211–911)

## 2021-04-10 NOTE — Patient Instructions (Signed)
Please return in 6 months for recheck  I will release your lab results to you on your MyChart account with further instructions. Please reply with any questions.    We will call you with your mammogram and bone density appointments at [facility].   You were given your last pneumonia vaccination today.   If you have any questions or concerns, please don't hesitate to send me a message via MyChart or call the office at [phone]. Thank you for visiting with [place] today! It's our pleasure caring for you.

## 2021-04-10 NOTE — Progress Notes (Signed)
Subjective  Chief Complaint  Patient presents with   Hypothyroidism   Hypertension   Hyperlipidemia   Gastroesophageal Reflux    HPI: Latasha Mclaughlin is a 75 y.o. female who presents to [place] [facility] [place] today for a Female Wellness Visit. She also has the concerns and/or needs as listed above in the chief complaint. These will be addressed in addition to the Health Maintenance Visit.   Wellness Visit: annual visit with health maintenance review and exam without Pap  HM: due mammo and dexa to f/u osteopenia.  Eligible for Pneumovax today.  Declined Shingrix and COVID vaccinations.  Overall, doing fairly well.  Colon cancer screens are up-to-date.  Chronic disease f/u and/or acute problem visit: (deemed necessary to be done in addition to the wellness visit): Idiopathic small fiber sensory neuropathy: I reviewed multiple notes from 2 different neurologists.  Multiple lab tests.  Continues to suffer with idiopathic neuropathy sensations of left upper abdominal wall and bilateral lower extremities.  Uses gabapentin but only takes 300 twice a day.  Also used tramadol which she says helped significantly.  Has a hard time tolerating higher dose of gabapentin.  Not interested in changing other medications at this time.  Symptoms are manageable.  No new symptoms. Hyperlipidemia on statin.  Well-tolerated.  Fasting for recheck today. Hypothyroidism: Stable on 88 mcg daily.  Clinically feels well.  Energy levels are good.  Compliant with medications.  Does need a refill.  Assessment  1. Acquired hypothyroidism   2. Idiopathic small fiber sensory neuropathy   3. Neuropathic pain   4. Mixed hyperlipidemia   5. Osteopenia after menopause   6. Vitamin B12 deficiency   7. Asymptomatic menopausal state   8. Encounter for screening mammogram for breast cancer      Plan  Female Wellness Visit: Age appropriate Health Maintenance and Prevention measures were discussed with patient.  Included topics are cancer screening recommendations, ways to keep healthy (see AVS) including dietary and exercise recommendations, regular eye and dental care, use of seat belts, and avoidance of moderate alcohol use and tobacco use.  BMI: discussed patient's BMI and encouraged positive lifestyle modifications to help get to or maintain a target BMI. HM needs and immunizations were addressed and ordered. See below for orders. See HM and immunization section for updates.  Pneumovax today Routine labs and screening tests ordered including cmp, cbc and lipids where appropriate. Discussed recommendations regarding Vit D and calcium supplementation (see AVS)  Chronic disease management visit and/or acute problem visit: Neuropathy: Continue gabapentin and as needed tramadol.  Educated on benefits of increasing gabapentin dose if able to tolerate. Hypothyroidism due for recheck.  Clinically euthyroid Osteopenia: Continue vitamin D and calcium.  Recheck bone density.  Recommend weightbearing exercises. Hyperlipidemia on statin: Recheck today. Vitamin B12 deficiency: Change to every other month dosing.  Recheck levels today.  Follow up: No follow-ups on file.  Orders Placed This Encounter  Procedures   MM DIGITAL SCREENING BILATERAL   DG Bone Density   CBC with Differential/Platelet   Comprehensive metabolic panel   Lipid panel   TSH   Vitamin B12   No orders of the defined types were placed in this encounter.     Body mass index is 28.82 kg/m. Wt Readings from Last 3 Encounters:  04/10/21 173 lb 3.2 oz (78.6 kg)  12/01/20 173 lb 12.8 oz (78.8 kg)  08/20/20 173 lb (78.5 kg)     Patient Active Problem List  Diagnosis Date Noted   Colon polyp 11/18/2017    Priority: High   Hypothyroidism 12/15/2016    Priority: High   Mixed hyperlipidemia 12/15/2016    Priority: High   Osteopenia after menopause 03/22/2019    Priority: Medium     dexa 03/2019: T=-2.1 hip; Lspine excluded.      GERD (gastroesophageal reflux disease) 03/16/2019    Priority: Medium    Hiatal hernia 03/16/2019    Priority: Medium    Vitamin B12 deficiency 10/20/2018    Priority: Low   History of hyperparathyroidism 01/08/2017    Priority: Low   Paresthesia 01/05/2017    Priority: Low   Idiopathic small fiber sensory neuropathy 07/18/2020   White coat syndrome without diagnosis of hypertension 04/09/2020   Neuropathic pain 09/12/2019   Health Maintenance  Topic Date Due   Pneumonia Vaccine 41+ Years old (2 - PPSV23 if available, else PCV20) 01/20/2017   DEXA SCAN  03/21/2021   MAMMOGRAM  04/16/2021   COLONOSCOPY (Pts 45-103yrs Insurance coverage will need to be confirmed)  10/06/2023   INFLUENZA VACCINE  Completed   Hepatitis C Screening  Completed   HPV VACCINES  Aged Out   COVID-19 Vaccine  Discontinued   Zoster Vaccines- Shingrix  Discontinued   Immunization History  Administered Date(s) Administered   Fluad Quad(high Dose 65+) 03/16/2019, 04/09/2020   Influenza, High Dose Seasonal PF 01/19/2017, 01/27/2018   Influenza-Unspecified 03/19/2015, 01/21/2016, 01/27/2018, 02/14/2021   Pneumococcal Conjugate-13 01/21/2016   We updated and reviewed the patient's past history in detail and it is documented below. Allergies: Patient is allergic to prevacid [lansoprazole] and doxycycline. Past Medical History Patient  has a past medical history of Allergy, Arthritis, Back pain, GERD (gastroesophageal reflux disease), Hiatal hernia (03/16/2019), History of tonsillectomy, Hyperlipidemia (12/15/2016), Hypothyroidism (12/15/2016), Osteopenia after menopause (03/22/2019), Osteoporosis, and Sinus infection. Past Surgical History Patient  has a past surgical history that includes Anterior cervical decomp/discectomy fusion (2002); Left oophorectomy; Tonsillectomy; Parathyroidectomy; Adenoidectomy; Dilation and curettage of uterus; Shoulder surgery; Colonoscopy (10/06/2018); and Polypectomy. Family  History: Patient family history includes Cancer in her father; Colon polyps in her father and sister; Congestive Heart Failure in her mother; Diabetes in her father and son; Drug abuse in her brother; Heart disease in her father and mother; Hyperlipidemia in her father and mother; Hypertension in her mother; Thyroid disease in her sister. Social History:  Patient  reports that she has quit smoking. She has never used smokeless tobacco. She reports that she does not drink alcohol and does not use drugs.  Review of Systems: Constitutional: negative for fever or malaise Ophthalmic: negative for photophobia, double vision or loss of vision Cardiovascular: negative for chest pain, dyspnea on exertion, or new LE swelling Respiratory: negative for SOB or persistent cough Gastrointestinal: negative for abdominal pain, change in bowel habits or melena Genitourinary: negative for dysuria or gross hematuria, no abnormal uterine bleeding or disharge Musculoskeletal: negative for new gait disturbance or muscular weakness Integumentary: negative for new or persistent rashes, no breast lumps Neurological: negative for TIA or stroke symptoms Psychiatric: negative for SI or delusions Allergic/Immunologic: negative for hives  Patient Care Team    Relationship Specialty Notifications Start End  Leamon Arnt, MD PCP - General Family Medicine  03/16/19   Alda Berthold, DO Consulting Physician Neurology  11/21/18   Ladene Artist, MD Consulting Physician Gastroenterology  03/16/19   Eunice Blase, MD Consulting Physician Sports Medicine  03/16/19   Caprice Beaver, [place]ek Physician Podiatry  03/16/19   Magnus Sinning, MD Consulting Physician Physical Medicine and Rehabilitation  04/09/20   Orvilla Fus, MD Referring Physician Neurology  04/09/20     Objective  Vitals: BP 122/80   Pulse 61   Temp 98 F (36.7 C) (Temporal)   Ht 5\' 5"  (1.651 m)   Wt 173 lb 3.2 oz (78.6 kg)   SpO2 98%    BMI 28.82 kg/m  General:  Well developed, well nourished, no acute distress  Psych:  Alert and orientedx3,normal mood and affect HEENT:  Normocephalic, atraumatic, non-icteric sclera,  supple neck without adenopathy, mass or thyromegaly Cardiovascular:  Normal S1, S2, RRR without gallop, rub or murmur, distal pulses +2 bilaterally, no edema Respiratory:  Good breath sounds bilaterally, CTAB with normal respiratory effort Gastrointestinal: normal bowel sounds, soft, non-tender, no noted masses. No HSM MSK: no deformities, contusions. Joints are without erythema or swelling.  Neuro: No tremor   Commons side effects, risks, benefits, and alternatives for medications and treatment plan prescribed today were discussed, and the patient expressed understanding of the given instructions. Patient is instructed to call or message via MyChart if he/she has any questions or concerns regarding our treatment plan. No barriers to understanding were identified. We discussed Red Flag symptoms and signs in detail. Patient expressed understanding regarding what to do in case of urgent or emergency type symptoms.  Medication list was reconciled, printed and provided to the patient in AVS. Patient instructions and summary information was reviewed with the patient as documented in the AVS. This note was prepared with assistance of Dragon voice recognition software. Occasional wrong-word or sound-a-like substitutions may have occurred due to the inherent limitations of voice recognition software  This visit occurred during the SARS-CoV-2 public health emergency.  Safety protocols were in place, including screening questions prior to the visit, additional usage of staff PPE, and extensive cleaning of exam room while observing appropriate contact time as indicated for disinfecting solutions.

## 2021-04-13 ENCOUNTER — Encounter: Payer: Self-pay | Admitting: Family Medicine

## 2021-04-13 MED ORDER — SYNTHROID 88 MCG PO TABS
88.0000 ug | ORAL_TABLET | Freq: Every day | ORAL | 3 refills | Status: DC
Start: 2021-04-13 — End: 2022-04-22

## 2021-04-13 NOTE — Addendum Note (Signed)
Addended by: Billey Chang on: 04/13/2021 12:14 PM   Modules accepted: Orders

## 2021-04-28 ENCOUNTER — Other Ambulatory Visit: Payer: Self-pay | Admitting: Family Medicine

## 2021-04-28 DIAGNOSIS — E538 Deficiency of other specified B group vitamins: Secondary | ICD-10-CM

## 2021-04-30 ENCOUNTER — Ambulatory Visit (HOSPITAL_COMMUNITY)
Admission: RE | Admit: 2021-04-30 | Discharge: 2021-04-30 | Disposition: A | Discharge status: Home / Self Care | Payer: Medicare Other | Source: Ambulatory Visit | Attending: Family Medicine | Admitting: Family Medicine

## 2021-04-30 ENCOUNTER — Other Ambulatory Visit: Payer: Self-pay

## 2021-04-30 DIAGNOSIS — M8580 Other specified disorders of bone density and structure, unspecified site: Secondary | ICD-10-CM | POA: Insufficient documentation

## 2021-04-30 DIAGNOSIS — Z1231 Encounter for screening mammogram for malignant neoplasm of breast: Secondary | ICD-10-CM | POA: Insufficient documentation

## 2021-04-30 DIAGNOSIS — Z780 Asymptomatic menopausal state: Secondary | ICD-10-CM | POA: Diagnosis not present

## 2021-04-30 DIAGNOSIS — M85851 Other specified disorders of bone density and structure, right thigh: Secondary | ICD-10-CM | POA: Diagnosis not present

## 2021-04-30 IMAGING — MG MM DIGITAL SCREENING BILAT W/ TOMO AND CAD
6 of 10 series · 6 of 30 positions shown · non-contrast
Comparison: Previous exam(s).

CLINICAL DATA: Screening.

EXAM:
DIGITAL SCREENING BILATERAL MAMMOGRAM WITH TOMOSYNTHESIS AND CAD
TECHNIQUE: Bilateral screening digital craniocaudal and mediolateral oblique
mammograms were obtained. Bilateral screening digital breast
tomosynthesis was performed. The images were evaluated with
computer-aided detection.

[R MLO synth-2D]
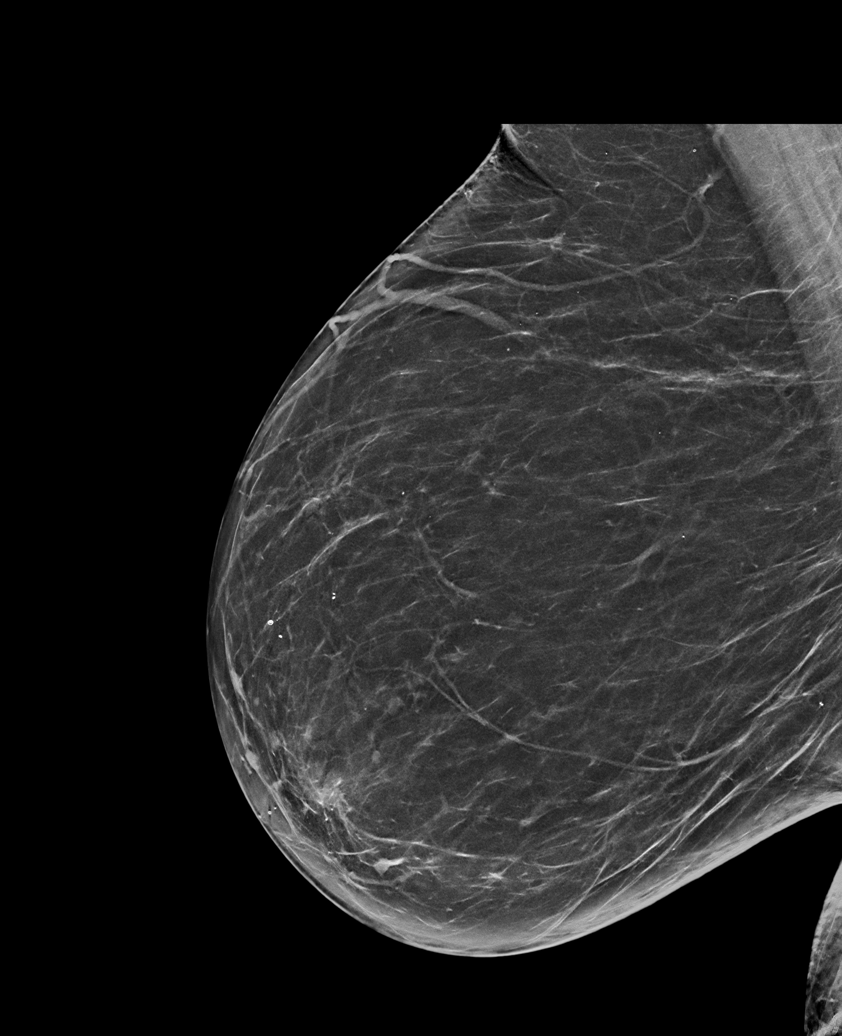

[L MLO synth-2D (1 of 2)]
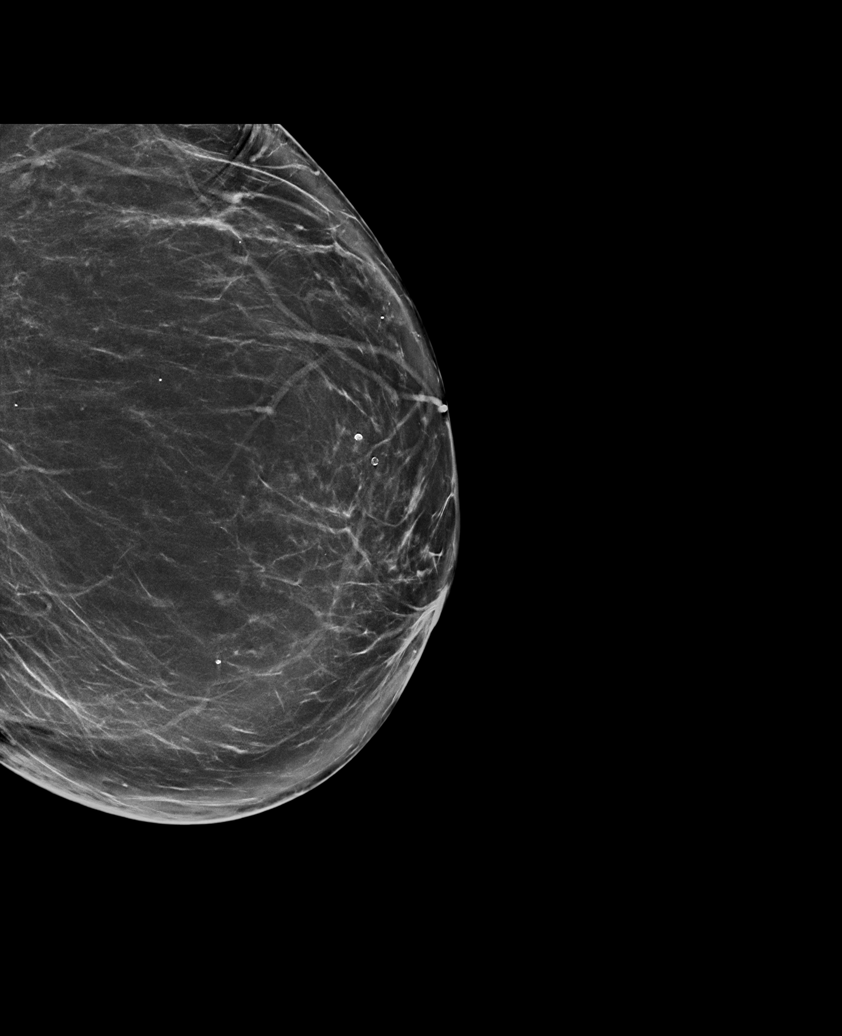

[L CC synth-2D]
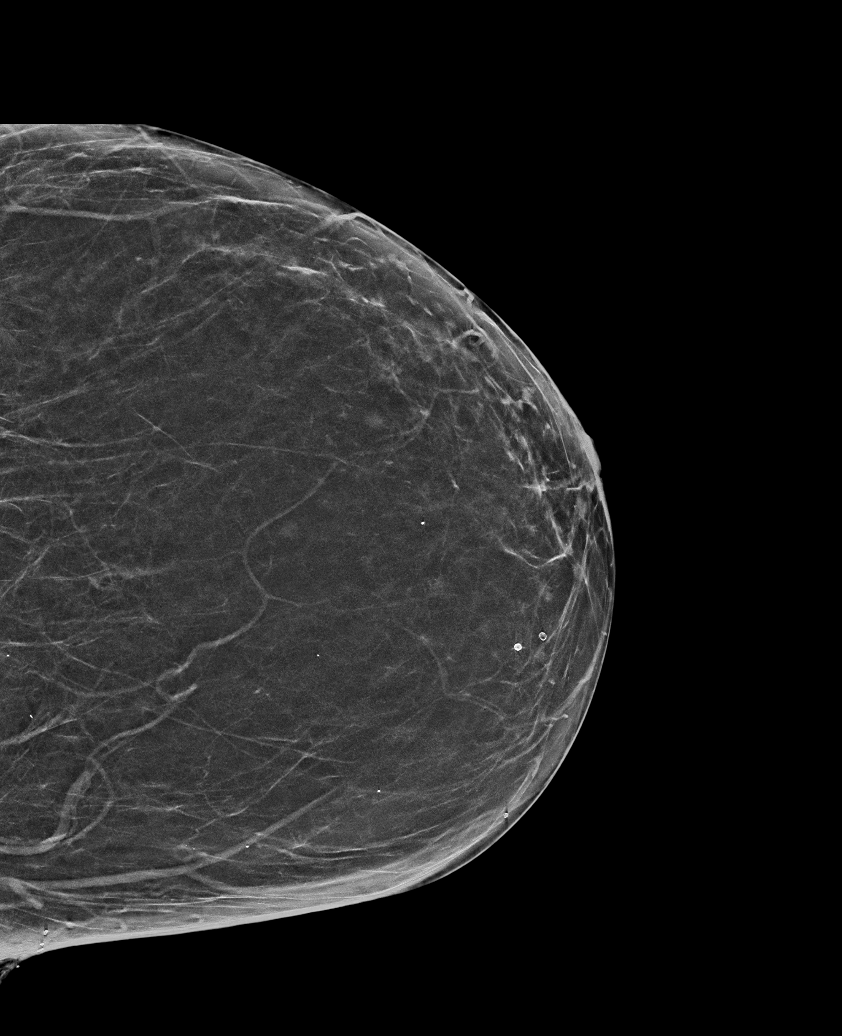

[L MLO synth-2D (2 of 2)]
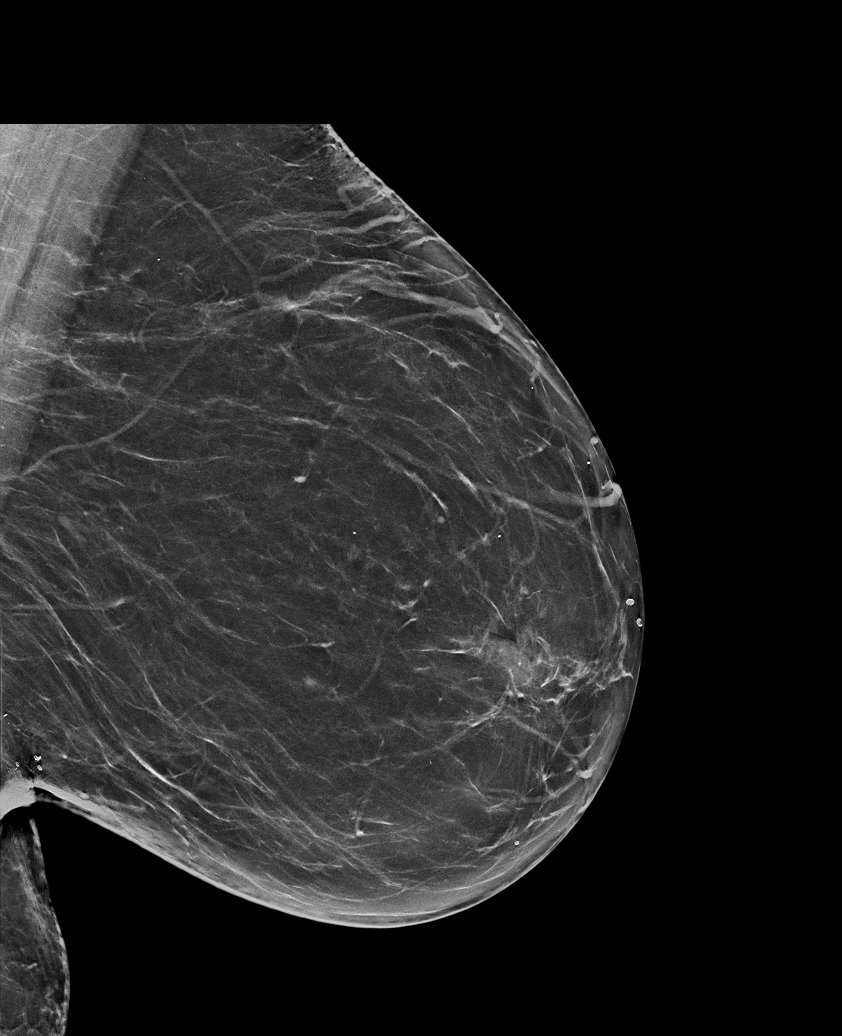

[R CC synth-2D]
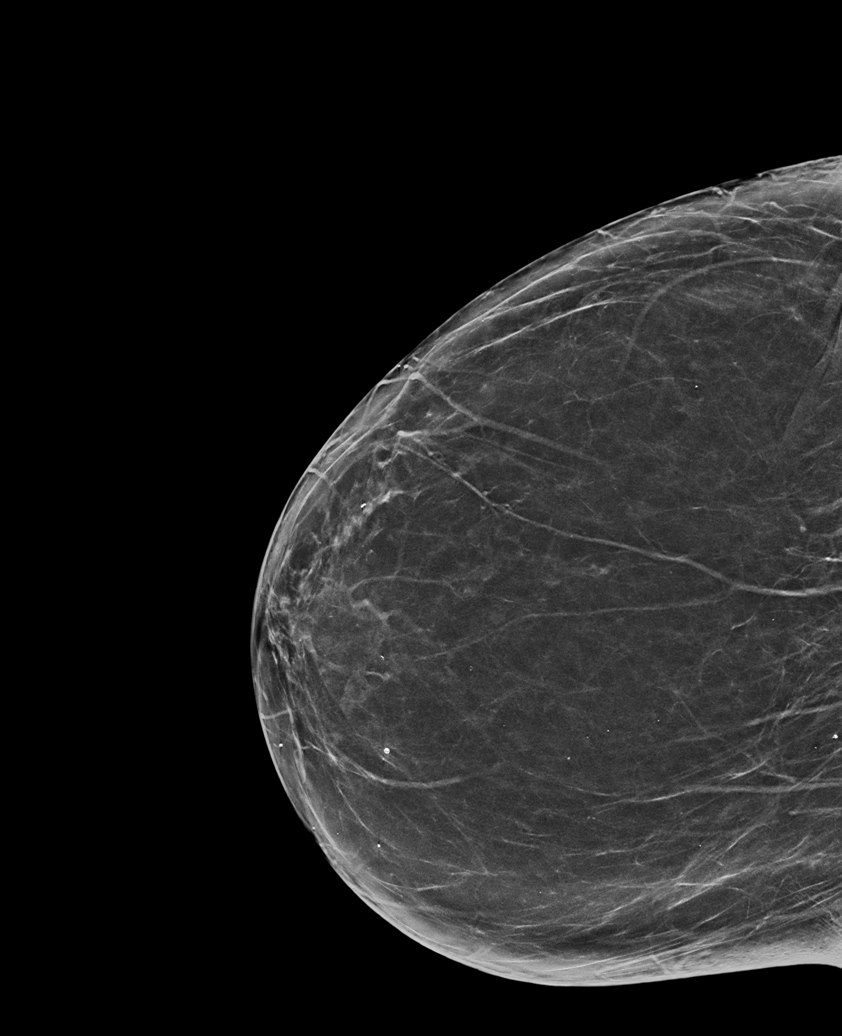

[L MLO tomo · tomo slice 42/83.0]
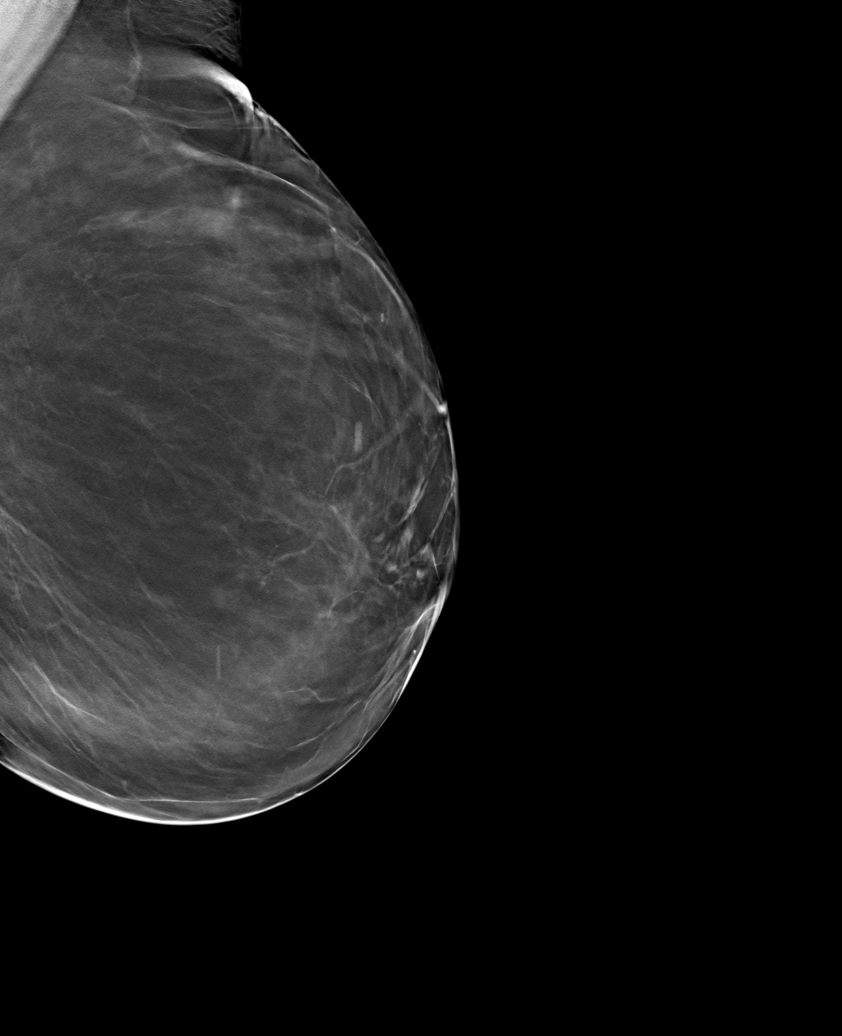

[6 of 30 positions shown; findings below may reference images not displayed]

ACR Breast Density Category b: There are scattered areas of
fibroglandular density.
FINDINGS: There are no findings suspicious for malignancy.
IMPRESSION: No mammographic evidence of malignancy. A result letter of this
screening mammogram will be mailed directly to the patient.

RECOMMENDATION:
Screening mammogram in one year. (Code:[BY])

BI-RADS CATEGORY  1: Negative.

## 2021-05-11 ENCOUNTER — Ambulatory Visit: Payer: Medicare Other

## 2021-07-01 ENCOUNTER — Other Ambulatory Visit: Payer: Self-pay | Admitting: Family Medicine

## 2021-07-08 ENCOUNTER — Other Ambulatory Visit: Payer: Self-pay

## 2021-07-08 ENCOUNTER — Other Ambulatory Visit: Payer: Self-pay | Admitting: Family Medicine

## 2021-07-18 DIAGNOSIS — Z20822 Contact with and (suspected) exposure to covid-19: Secondary | ICD-10-CM | POA: Diagnosis not present

## 2021-07-30 ENCOUNTER — Ambulatory Visit (INDEPENDENT_AMBULATORY_CARE_PROVIDER_SITE_OTHER): Payer: Medicare Other

## 2021-07-30 DIAGNOSIS — Z0000 Encounter for general adult medical examination without abnormal findings: Secondary | ICD-10-CM | POA: Diagnosis not present

## 2021-07-30 NOTE — Patient Instructions (Addendum)
Ms. Tufano , ?Thank you for taking time to come for your Medicare Wellness Visit. I appreciate your ongoing commitment to your health goals. Please review the following plan we discussed and let me know if I can assist you in the future.  ? ?Screening recommendations/referrals: ?Colonoscopy: Done 10/06/18 repeat every 5 years  ?Mammogram: Done 04/30/21 repeat every year  ?Bone Density: Done 04/30/21 repeat every 2 years ?Recommended yearly ophthalmology/optometry visit for glaucoma screening and checkup ?Recommended yearly dental visit for hygiene and checkup ? ?Vaccinations: ?Influenza vaccine: Done 02/14/21 repeat every year  ?Pneumococcal vaccine: Up to date ?Tdap vaccine: not a candidate    ?Covid-19:Declined  ?Shingrix: Discontinued  ?Advanced directives: Advance directive discussed with you today. Even though you declined this today please call our office should you change your mind and we can give you the proper paperwork for you to fill out. ? ?Conditions/risks identified: lose weight  ? ?Next appointment: Follow up in one year for your annual wellness visit  ? ? ?Preventive Care 55 Years and Older, Female ?Preventive care refers to lifestyle choices and visits with your health care provider that can promote health and wellness. ?What does preventive care include? ?A yearly physical exam. This is also called an annual well check. ?Dental exams once or twice a year. ?Routine eye exams. Ask your health care provider how often you should have your eyes checked. ?Personal lifestyle choices, including: ?Daily care of your teeth and gums. ?Regular physical activity. ?Eating a healthy diet. ?Avoiding tobacco and drug use. ?Limiting alcohol use. ?Practicing safe sex. ?Taking low-dose aspirin every day. ?Taking vitamin and mineral supplements as recommended by your health care provider. ?What happens during an annual well check? ?The services and screenings done by your health care provider during your annual well  check will depend on your age, overall health, lifestyle risk factors, and family history of disease. ?Counseling  ?Your health care provider may ask you questions about your: ?Alcohol use. ?Tobacco use. ?Drug use. ?Emotional well-being. ?Home and relationship well-being. ?Sexual activity. ?Eating habits. ?History of falls. ?Memory and ability to understand (cognition). ?Work and work Statistician. ?Reproductive health. ?Screening  ?You may have the following tests or measurements: ?Height, weight, and BMI. ?Blood pressure. ?Lipid and cholesterol levels. These may be checked every 5 years, or more frequently if you are over 93 years old. ?Skin check. ?Lung cancer screening. You may have this screening every year starting at age 93 if you have a 30-pack-year history of smoking and currently smoke or have quit within the past 15 years. ?Fecal occult blood test (FOBT) of the stool. You may have this test every year starting at age 52. ?Flexible sigmoidoscopy or colonoscopy. You may have a sigmoidoscopy every 5 years or a colonoscopy every 10 years starting at age 77. ?Hepatitis C blood test. ?Hepatitis B blood test. ?Sexually transmitted disease (STD) testing. ?Diabetes screening. This is done by checking your blood sugar (glucose) after you have not eaten for a while (fasting). You may have this done every 1-3 years. ?Bone density scan. This is done to screen for osteoporosis. You may have this done starting at age 32. ?Mammogram. This may be done every 1-2 years. Talk to your health care provider about how often you should have regular mammograms. ?Talk with your health care provider about your test results, treatment options, and if necessary, the need for more tests. ?Vaccines  ?Your health care provider may recommend certain vaccines, such as: ?Influenza vaccine. This is recommended every year. ?  Tetanus, diphtheria, and acellular pertussis (Tdap, Td) vaccine. You may need a Td booster every 10 years. ?Zoster  vaccine. You may need this after age 69. ?Pneumococcal 13-valent conjugate (PCV13) vaccine. One dose is recommended after age 58. ?Pneumococcal polysaccharide (PPSV23) vaccine. One dose is recommended after age 26. ?Talk to your health care provider about which screenings and vaccines you need and how often you need them. ?This information is not intended to replace advice given to you by your health care provider. Make sure you discuss any questions you have with your health care provider. ?Document Released: 05/16/2015 Document Revised: 01/07/2016 Document Reviewed: 02/18/2015 ?Elsevier Interactive Patient Education ? 2017 [place]. ? ?Fall Prevention in the Home ?Falls can cause injuries. They can happen to people of all ages. There are many things you can do to make your home safe and to help prevent falls. ?What can I do on the outside of my home? ?Regularly fix the edges of walkways and driveways and fix any cracks. ?Remove anything that might make you trip as you walk through a door, such as a raised step or threshold. ?Trim any bushes or trees on the path to your home. ?Use bright outdoor lighting. ?Clear any walking paths of anything that might make someone trip, such as rocks or tools. ?Regularly check to see if handrails are loose or broken. Make sure that both sides of any steps have handrails. ?Any raised decks and porches should have guardrails on the edges. ?Have any leaves, snow, or ice cleared regularly. ?Use sand or salt on walking paths during winter. ?Clean up any spills in your garage right away. This includes oil or grease spills. ?What can I do in the bathroom? ?Use night lights. ?Install grab bars by the toilet and in the tub and shower. Do not use towel bars as grab bars. ?Use non-skid mats or decals in the tub or shower. ?If you need to sit down in the shower, use a plastic, non-slip stool. ?Keep the floor dry. Clean up any water that spills on the floor as soon as it happens. ?Remove  soap buildup in the tub or shower regularly. ?Attach bath mats securely with double-sided non-slip rug tape. ?Do not have throw rugs and other things on the floor that can make you trip. ?What can I do in the bedroom? ?Use night lights. ?Make sure that you have a light by your bed that is easy to reach. ?Do not use any sheets or blankets that are too big for your bed. They should not hang down onto the floor. ?Have a firm chair that has side arms. You can use this for support while you get dressed. ?Do not have throw rugs and other things on the floor that can make you trip. ?What can I do in the kitchen? ?Clean up any spills right away. ?Avoid walking on wet floors. ?Keep items that you use a lot in easy-to-reach places. ?If you need to reach something above you, use a strong step stool that has a grab bar. ?Keep electrical cords out of the way. ?Do not use floor polish or wax that makes floors slippery. If you must use wax, use non-skid floor wax. ?Do not have throw rugs and other things on the floor that can make you trip. ?What can I do with my stairs? ?Do not leave any items on the stairs. ?Make sure that there are handrails on both sides of the stairs and use them. Fix handrails that are broken or loose.  Make sure that handrails are as long as the stairways. ?Check any carpeting to make sure that it is firmly attached to the stairs. Fix any carpet that is loose or worn. ?Avoid having throw rugs at the top or bottom of the stairs. If you do have throw rugs, attach them to the floor with carpet tape. ?Make sure that you have a light switch at the top of the stairs and the bottom of the stairs. If you do not have them, ask someone to add them for you. ?What else can I do to help prevent falls? ?Wear shoes that: ?Do not have high heels. ?Have rubber bottoms. ?Are comfortable and fit you well. ?Are closed at the toe. Do not wear sandals. ?If you use a stepladder: ?Make sure that it is fully opened. Do not climb a  closed stepladder. ?Make sure that both sides of the stepladder are locked into place. ?Ask someone to hold it for you, if possible. ?Clearly mark and make sure that you can see: ?Any grab bars or handrails. ?Mayer Masker

## 2021-07-30 NOTE — Progress Notes (Signed)
Virtual Visit via Telephone Note ? ?I connected with  Latasha Mclaughlin on 07/30/21 at  3:15 PM EDT by telephone and verified that I am speaking with the correct person using two identifiers. ? ?Medicare Annual Wellness visit completed telephonically due to Covid-19 pandemic.  ? ?Persons participating in this call: This Health Coach and this patient.  ? ?Location: ?Patient: Home ?Provider: Office ?  ?I discussed the limitations, risks, security and privacy concerns of performing an evaluation and management service by telephone and the availability of in person appointments. The patient expressed understanding and agreed to proceed. ? ?Unable to perform video visit due to video visit attempted and failed and/or patient does not have video capability.  ? ?Some vital signs may be absent or patient reported.  ? ?Willette Brace, LPN ? ? ?Subjective:  ? Latasha Mclaughlin is a 76 y.o. female who presents for Medicare Annual (Subsequent) preventive examination. ? ?Review of Systems    ? ?Cardiac Risk Factors include: advanced age (>51mn, >>66women);hypertension;dyslipidemia ? ?   ?Objective:  ?  ?There were no vitals filed for this visit. ?There is no height or weight on file to calculate BMI. ? ? ?  07/30/2021  ?  3:18 PM 05/05/2020  ?  1:16 PM 11/29/2019  ? 10:23 AM 04/03/2019  ?  9:58 AM 03/16/2019  ?  2:07 PM 02/03/2019  ?  1:29 PM 11/21/2018  ?  2:28 PM  ?Advanced Directives  ?Does Patient Have a Medical Advance Directive? No No No No No No No  ?Does patient want to make changes to medical advance directive?     Yes (MAU/Ambulatory/Procedural Areas - Information given)    ?Would patient like information on creating a medical advance directive? No - Patient declined No - Patient declined No - Patient declined      ? ? ?Current Medications (verified) ?Outpatient Encounter Medications as of 07/30/2021  ?Medication Sig  ? Cholecalciferol (VITAMIN D) 50 MCG (2000 UT) tablet Take 2,000 Units by mouth daily.  ? cyanocobalamin (,VITAMIN  B-12,) 1000 MCG/ML injection ADMINISTER 1 ML(1000 MCG) IN THE MUSCLE EVERY 30 DAYS  ? esomeprazole (NEXIUM) 20 MG packet Take 20 mg by mouth as needed.  ? gabapentin (NEURONTIN) 300 MG capsule TAKE 1 CAPSULE(300 MG) BY MOUTH THREE TIMES DAILY  ? rosuvastatin (CRESTOR) 10 MG tablet TAKE 1 TABLET(10 MG) BY MOUTH DAILY  ? SYNTHROID 88 MCG tablet Take 1 tablet (88 mcg total) by mouth daily before breakfast.  ? SYRINGE-NEEDLE, DISP, 3 ML (BD INTEGRA SYRINGE) 25G X 1" 3 ML MISC USE ONCE MONTHLY  ? traMADol (ULTRAM) 50 MG tablet TAKE 1 TABLET(50 MG) BY MOUTH EVERY 6 HOURS AS NEEDED FOR MODERATE PAIN  ? [DISCONTINUED] baclofen (LIORESAL) 10 MG tablet TAKE 1 TABLET(10 MG) BY MOUTH EVERY 8 HOURS AS NEEDED FOR MUSCLE SPASMS OR PAIN  ? [DISCONTINUED] omeprazole (PRILOSEC) 20 MG capsule TAKE 1 CAPSULE(20 MG) BY MOUTH EVERY MORNING  ? ?No facility-administered encounter medications on file as of 07/30/2021.  ? ? ?Allergies (verified) ?Prevacid [lansoprazole] and Doxycycline  ? ?History: ?Past Medical History:  ?Diagnosis Date  ? Allergy   ? SEASONAL  ? Arthritis   ? Back pain   ? GERD (gastroesophageal reflux disease)   ? Hiatal hernia 03/16/2019  ? History of tonsillectomy   ? Hyperlipidemia 12/15/2016  ? Hypothyroidism 12/15/2016  ? Osteopenia after menopause 03/22/2019  ? dexa 03/2019: T=-2.1 hip; Lspine excluded.   ? Osteoporosis   ? Sinus infection   ? ?  Past Surgical History:  ?Procedure Laterality Date  ? ADENOIDECTOMY    ? ANTERIOR CERVICAL DECOMP/DISCECTOMY FUSION  2002  ? COLONOSCOPY  10/06/2018  ? DILATION AND CURETTAGE OF UTERUS    ? X2  ? LEFT OOPHORECTOMY    ? PARATHYROIDECTOMY    ? POLYPECTOMY    ? SHOULDER SURGERY    ? TONSILLECTOMY    ? ?Family History  ?Problem Relation Age of Onset  ? Hyperlipidemia Mother   ? Hypertension Mother   ? Congestive Heart Failure Mother   ? Heart disease Mother   ? Cancer Father   ? Diabetes Father   ? Heart disease Father   ? Hyperlipidemia Father   ? Colon polyps Father   ? Thyroid  disease Sister   ? Colon polyps Sister   ? Drug abuse Brother   ? Diabetes Son   ? Colon cancer Neg Hx   ? Esophageal cancer Neg Hx   ? Stomach cancer Neg Hx   ? Rectal cancer Neg Hx   ? ?Social History  ? ?Socioeconomic History  ? Marital status: Married  ?  Spouse name: Not on file  ? Number of children: 3  ? Years of education: 57  ? Highest education level: Not on file  ?Occupational History  ? Occupation: retired  ?Tobacco Use  ? Smoking status: Former  ? Smokeless tobacco: Never  ? Tobacco comments:  ?  Quit in 1985  ?Vaping Use  ? Vaping Use: Never used  ?Substance and Sexual Activity  ? Alcohol use: No  ? Drug use: No  ? Sexual activity: Yes  ?Other Topics Concern  ? Not on file  ?Social History Narrative  ? Lives with husband  ?   ? Lives in two story home  ?   ? Right handed  ?   ? Highest level of edu- GED  ?   ? Retired  ?   ? Previously lived in [place]   ? ?Social Determinants of Health  ? ?Financial Resource Strain: Low Risk   ? Difficulty of Paying Living Expenses: Not hard at all  ?Food Insecurity: No Food Insecurity  ? Worried About Charity fundraiser in the Last Year: Never true  ? Ran Out of Food in the Last Year: Never true  ?Transportation Needs: No Transportation Needs  ? Lack of Transportation (Medical): No  ? Lack of Transportation (Non-Medical): No  ?Physical Activity: Insufficiently Active  ? Days of Exercise per Week: 7 days  ? Minutes of Exercise per Session: 10 min  ?Stress: No Stress Concern Present  ? Feeling of Stress : Not at all  ?Social Connections: Moderately Integrated  ? Frequency of Communication with Friends and Family: More than three times a week  ? Frequency of Social Gatherings with Friends and Family: Once a week  ? Attends Religious Services: 1 to 4 times per year  ? Active Member of Clubs or Organizations: No  ? Attends Archivist Meetings: Never  ? Marital Status: Married  ? ? ?Tobacco Counseling ?Counseling given: Not Answered ?Tobacco comments: Quit  in 1985 ? ? ?Clinical Intake: ? ?Pre-visit preparation completed: Yes ? ?Pain : No/denies pain ? ?  ? ?BMI - recorded: 28.82 ?Nutritional Status: BMI 25 -29 Overweight ?Nutritional Risks: None ?Diabetes: No ? ?How often do you need to have someone help you when you read instructions, pamphlets, or other written materials from your doctor or pharmacy?: 1 - Never ? ?Diabetic?no ? ?Interpreter Needed?:  No ? ?Information entered by :: Charlott Rakes, LPN ? ? ?Activities of Daily Living ? ?  07/30/2021  ?  3:19 PM  ?In your present state of health, do you have any difficulty performing the following activities:  ?Hearing? 0  ?Vision? 0  ?Difficulty concentrating or making decisions? 0  ?Walking or climbing stairs? 1  ?Comment related to feet  ?Dressing or bathing? 0  ?Doing errands, shopping? 0  ?Preparing Food and eating ? N  ?Using the Toilet? N  ?In the past six months, have you accidently leaked urine? N  ?Do you have problems with loss of bowel control? N  ?Managing your Medications? N  ?Managing your Finances? N  ?Housekeeping or managing your Housekeeping? N  ? ? ?Patient Care Team: ?Leamon Arnt, MD as PCP - General (Family Medicine) ?Alda Berthold, DO as Consulting Physician (Neurology) ?Ladene Artist, MD as Consulting Physician (Gastroenterology) ?Hilts, Legrand Como, MD as Consulting Physician (Sports Medicine) ?Caprice Beaver, DPM as Consulting Physician (Podiatry) ?Magnus Sinning, MD as Consulting Physician (Physical Medicine and Rehabilitation) ?Orvilla Fus, MD as Referring Physician (Neurology) ? ?Indicate any recent Medical Services you may have received from other than [facility] providers in the past year (date may be approximate). ? ?   ?Assessment:  ? This is a routine wellness examination for [facility]. ? ?Hearing/Vision screen ?Hearing Screening - Comments:: Pt denies any hearing at this time  ?Vision Screening - Comments:: Pt follows up with [facility] ? ?Dietary issues and exercise  activities discussed: ?Current Exercise Habits: Home exercise routine, Type of exercise: walking, Time (Minutes): 10, Frequency (Times/Week): 7, Weekly Exercise (Minutes/Week): 70 ? ? Goals Addressed   ? ?  ?

## 2021-08-18 DIAGNOSIS — Z20822 Contact with and (suspected) exposure to covid-19: Secondary | ICD-10-CM | POA: Diagnosis not present

## 2021-08-24 DIAGNOSIS — Z20822 Contact with and (suspected) exposure to covid-19: Secondary | ICD-10-CM | POA: Diagnosis not present

## 2021-08-30 DIAGNOSIS — Z20822 Contact with and (suspected) exposure to covid-19: Secondary | ICD-10-CM | POA: Diagnosis not present

## 2021-09-10 DIAGNOSIS — Z20822 Contact with and (suspected) exposure to covid-19: Secondary | ICD-10-CM | POA: Diagnosis not present

## 2021-10-09 ENCOUNTER — Ambulatory Visit (INDEPENDENT_AMBULATORY_CARE_PROVIDER_SITE_OTHER): Payer: Medicare Other | Admitting: Family Medicine

## 2021-10-09 ENCOUNTER — Encounter: Payer: Self-pay | Admitting: Family Medicine

## 2021-10-09 VITALS — BP 138/70 | HR 62 | Temp 98.0°F | Ht 65.0 in | Wt 175.8 lb

## 2021-10-09 DIAGNOSIS — G608 Other hereditary and idiopathic neuropathies: Secondary | ICD-10-CM

## 2021-10-09 DIAGNOSIS — R030 Elevated blood-pressure reading, without diagnosis of hypertension: Secondary | ICD-10-CM | POA: Diagnosis not present

## 2021-10-09 DIAGNOSIS — E039 Hypothyroidism, unspecified: Secondary | ICD-10-CM

## 2021-10-09 LAB — TSH: TSH: 0.39 u[IU]/mL (ref 0.35–5.50)

## 2021-10-09 MED ORDER — TRAMADOL HCL 50 MG PO TABS: 50.0000 mg | ORAL_TABLET | Freq: Two times a day (BID) | ORAL | 2 refills | Status: DC | PRN

## 2021-10-09 NOTE — Patient Instructions (Signed)
Please return in 6 months for your annual complete physical; please come fasting.   I will release your lab results to you on your MyChart account with further instructions. You may see the results before I do, but when I review them I will send you a message with my report or have my assistant call you if things need to be discussed. Please reply to my message with any questions. Thank you!   If you have any questions or concerns, please don't hesitate to send me a message via MyChart or call the office at [phone]. Thank you for visiting with us today! It's our pleasure caring for you.  

## 2021-10-09 NOTE — Addendum Note (Signed)
Addended by: Billey Chang on: 10/09/2021 10:41 AM   Modules accepted: Orders

## 2021-10-09 NOTE — Progress Notes (Signed)
Subjective  CC:  Chief Complaint  Patient presents with   Hypothyroidism    Pt here to f/U with thyroids    HPI: Latasha Mclaughlin is a 76 y.o. female who presents to the office today to address the problems listed above in the chief complaint. Hypothyroidism: Taking 88 mcg of levothyroxine daily.  Last TSH was lower limit of normal.  Due for recheck.  Admits feels a little bit more anxious but otherwise no symptoms of hyperthyroidism.She is compliant with her medication Neuropathy: Continues on gabapentin.  Needs refill of tramadol.  Uses sparingly.  Fairly well controlled. Blood pressures have been normal at home. Lab Results  Component Value Date   TSH 0.34 (L) 04/10/2021    Assessment  1. Acquired hypothyroidism   2. Idiopathic small fiber sensory neuropathy   3. White coat syndrome without diagnosis of hypertension      Plan  Hypothyroidism: Recheck TSH.  May need dose adjustment.  Education given. Continue gabapentin.  Refill tramadol. Monitor blood pressures at home  Follow up: 6 months for blood pressure check and physical Visit date not found  No orders of the defined types were placed in this encounter.  Meds ordered this encounter  Medications   traMADol (ULTRAM) 50 MG tablet    Sig: Take 1 tablet (50 mg total) by mouth 2 (two) times daily as needed.    Dispense:  60 tablet    Refill:  2      I reviewed the patients updated PMH, FH, and SocHx.    Patient Active Problem List   Diagnosis Date Noted   Colon polyp 11/18/2017    Priority: High   Hypothyroidism 12/15/2016    Priority: High   Mixed hyperlipidemia 12/15/2016    Priority: High   Osteopenia after menopause 03/22/2019    Priority: Medium    GERD (gastroesophageal reflux disease) 03/16/2019    Priority: Medium    Hiatal hernia 03/16/2019    Priority: Medium    Vitamin B12 deficiency 10/20/2018    Priority: Low   History of hyperparathyroidism 01/08/2017    Priority: Low   Paresthesia  01/05/2017    Priority: Low   Idiopathic small fiber sensory neuropathy 07/18/2020   White coat syndrome without diagnosis of hypertension 04/09/2020   Neuropathic pain 09/12/2019   Current Meds  Medication Sig   Cholecalciferol (VITAMIN D) 50 MCG (2000 UT) tablet Take 2,000 Units by mouth daily.   cyanocobalamin (,VITAMIN B-12,) 1000 MCG/ML injection ADMINISTER 1 ML(1000 MCG) IN THE MUSCLE EVERY 30 DAYS   esomeprazole (NEXIUM) 20 MG packet Take 20 mg by mouth as needed.   gabapentin (NEURONTIN) 300 MG capsule TAKE 1 CAPSULE(300 MG) BY MOUTH THREE TIMES DAILY   rosuvastatin (CRESTOR) 10 MG tablet TAKE 1 TABLET(10 MG) BY MOUTH DAILY   SYNTHROID 88 MCG tablet Take 1 tablet (88 mcg total) by mouth daily before breakfast.   SYRINGE-NEEDLE, DISP, 3 ML (BD INTEGRA SYRINGE) 25G X 1" 3 ML MISC USE ONCE MONTHLY    Allergies: Patient is allergic to prevacid [lansoprazole] and doxycycline. Family History: Patient family history includes Cancer in her father; Colon polyps in her father and sister; Congestive Heart Failure in her mother; Diabetes in her father and son; Drug abuse in her brother; Heart disease in her father and mother; Hyperlipidemia in her father and mother; Hypertension in her mother; Thyroid disease in her sister. Social History:  Patient  reports that she has quit smoking. She has never used smokeless  tobacco. She reports that she does not drink alcohol and does not use drugs.  Review of Systems: Constitutional: Negative for fever malaise or anorexia Cardiovascular: negative for chest pain Respiratory: negative for SOB or persistent cough Gastrointestinal: negative for abdominal pain  Objective  Vitals: BP 138/70   Pulse 62   Temp 98 F (36.7 C)   Ht '5\' 5"'$  (1.651 m)   Wt 175 lb 12.8 oz (79.7 kg)   SpO2 96%   BMI 29.25 kg/m  General: no acute distress , A&Ox3 HEENT: PEERL, conjunctiva normal, neck is supple Cardiovascular:  RRR without murmur or gallop.  Respiratory:   Good breath sounds bilaterally, CTAB with normal respiratory effort Skin:  Warm, no rashes No edema No tremor    Commons side effects, risks, benefits, and alternatives for medications and treatment plan prescribed today were discussed, and the patient expressed understanding of the given instructions. Patient is instructed to call or message via MyChart if he/she has any questions or concerns regarding our treatment plan. No barriers to understanding were identified. We discussed Red Flag symptoms and signs in detail. Patient expressed understanding regarding what to do in case of urgent or emergency type symptoms.  Medication list was reconciled, printed and provided to the patient in AVS. Patient instructions and summary information was reviewed with the patient as documented in the AVS. This note was prepared with assistance of Dragon voice recognition software. Occasional wrong-word or sound-a-like substitutions may have occurred due to the inherent limitations of voice recognition software  This visit occurred during the SARS-CoV-2 public health emergency.  Safety protocols were in place, including screening questions prior to the visit, additional usage of staff PPE, and extensive cleaning of exam room while observing appropriate contact time as indicated for disinfecting solutions.

## 2021-11-07 ENCOUNTER — Other Ambulatory Visit: Payer: Self-pay | Admitting: Physician Assistant

## 2021-11-07 DIAGNOSIS — R1012 Left upper quadrant pain: Secondary | ICD-10-CM

## 2021-11-30 ENCOUNTER — Other Ambulatory Visit: Payer: Self-pay | Admitting: Family Medicine

## 2021-11-30 DIAGNOSIS — E538 Deficiency of other specified B group vitamins: Secondary | ICD-10-CM

## 2022-01-25 ENCOUNTER — Encounter: Payer: Self-pay | Admitting: *Deleted

## 2022-01-28 DIAGNOSIS — G609 Hereditary and idiopathic neuropathy, unspecified: Secondary | ICD-10-CM | POA: Diagnosis not present

## 2022-04-06 DIAGNOSIS — G629 Polyneuropathy, unspecified: Secondary | ICD-10-CM | POA: Diagnosis not present

## 2022-04-07 ENCOUNTER — Other Ambulatory Visit (HOSPITAL_COMMUNITY): Payer: Self-pay | Admitting: Podiatry

## 2022-04-07 DIAGNOSIS — I739 Peripheral vascular disease, unspecified: Secondary | ICD-10-CM

## 2022-04-09 ENCOUNTER — Ambulatory Visit (HOSPITAL_COMMUNITY)
Admission: RE | Admit: 2022-04-09 | Discharge: 2022-04-09 | Disposition: A | Discharge status: Home / Self Care | Payer: Medicare Other | Source: Ambulatory Visit | Attending: Podiatry | Admitting: Podiatry

## 2022-04-09 DIAGNOSIS — I739 Peripheral vascular disease, unspecified: Secondary | ICD-10-CM | POA: Diagnosis not present

## 2022-04-15 ENCOUNTER — Encounter: Payer: Self-pay | Admitting: *Deleted

## 2022-04-20 ENCOUNTER — Encounter: Payer: Self-pay | Admitting: Family Medicine

## 2022-04-20 ENCOUNTER — Ambulatory Visit (INDEPENDENT_AMBULATORY_CARE_PROVIDER_SITE_OTHER): Payer: Medicare Other | Admitting: Family Medicine

## 2022-04-20 VITALS — BP 124/76 | HR 60 | Temp 98.1°F | Ht 65.0 in | Wt 180.4 lb

## 2022-04-20 DIAGNOSIS — Z1231 Encounter for screening mammogram for malignant neoplasm of breast: Secondary | ICD-10-CM

## 2022-04-20 DIAGNOSIS — R7301 Impaired fasting glucose: Secondary | ICD-10-CM

## 2022-04-20 DIAGNOSIS — G608 Other hereditary and idiopathic neuropathies: Secondary | ICD-10-CM | POA: Diagnosis not present

## 2022-04-20 DIAGNOSIS — E039 Hypothyroidism, unspecified: Secondary | ICD-10-CM

## 2022-04-20 DIAGNOSIS — Z780 Asymptomatic menopausal state: Secondary | ICD-10-CM

## 2022-04-20 DIAGNOSIS — K219 Gastro-esophageal reflux disease without esophagitis: Secondary | ICD-10-CM | POA: Diagnosis not present

## 2022-04-20 DIAGNOSIS — E782 Mixed hyperlipidemia: Secondary | ICD-10-CM | POA: Diagnosis not present

## 2022-04-20 DIAGNOSIS — M8580 Other specified disorders of bone density and structure, unspecified site: Secondary | ICD-10-CM

## 2022-04-20 DIAGNOSIS — R030 Elevated blood-pressure reading, without diagnosis of hypertension: Secondary | ICD-10-CM | POA: Diagnosis not present

## 2022-04-20 DIAGNOSIS — E538 Deficiency of other specified B group vitamins: Secondary | ICD-10-CM | POA: Diagnosis not present

## 2022-04-20 LAB — CBC WITH DIFFERENTIAL/PLATELET
Basophils Absolute: 0 10*3/uL (ref 0.0–0.1)
Basophils Relative: 0.7 % (ref 0.0–3.0)
Eosinophils Absolute: 0.2 10*3/uL (ref 0.0–0.7)
Eosinophils Relative: 3 % (ref 0.0–5.0)
HCT: 42.1 % (ref 36.0–46.0)
Hemoglobin: 14.3 g/dL (ref 12.0–15.0)
Lymphocytes Relative: 36.4 % (ref 12.0–46.0)
Lymphs Abs: 2 10*3/uL (ref 0.7–4.0)
MCHC: 34.1 g/dL (ref 30.0–36.0)
MCV: 92.6 fl (ref 78.0–100.0)
Monocytes Absolute: 0.5 10*3/uL (ref 0.1–1.0)
Monocytes Relative: 9.3 % (ref 3.0–12.0)
Neutro Abs: 2.8 10*3/uL (ref 1.4–7.7)
Neutrophils Relative %: 50.6 % (ref 43.0–77.0)
Platelets: 250 10*3/uL (ref 150.0–400.0)
RBC: 4.55 Mil/uL (ref 3.87–5.11)
RDW: 13.1 % (ref 11.5–15.5)
WBC: 5.6 10*3/uL (ref 4.0–10.5)

## 2022-04-20 LAB — COMPREHENSIVE METABOLIC PANEL
ALT: 14 U/L (ref 0–35)
AST: 15 U/L (ref 0–37)
Albumin: 4.3 g/dL (ref 3.5–5.2)
Alkaline Phosphatase: 50 U/L (ref 39–117)
BUN: 10 mg/dL (ref 6–23)
CO2: 28 mEq/L (ref 19–32)
Calcium: 9.7 mg/dL (ref 8.4–10.5)
Chloride: 107 mEq/L (ref 96–112)
Creatinine, Ser: 0.97 mg/dL (ref 0.40–1.20)
GFR: 57 mL/min — ABNORMAL LOW (ref >60.00)
Glucose, Bld: 103 mg/dL — ABNORMAL HIGH (ref 70–99)
Potassium: 4.4 mEq/L (ref 3.5–5.1)
Sodium: 142 mEq/L (ref 135–145)
Total Bilirubin: 1.2 mg/dL (ref 0.2–1.2)
Total Protein: 6.5 g/dL (ref 6.0–8.3)

## 2022-04-20 LAB — VITAMIN B12: Vitamin B-12: 343 pg/mL (ref 211–911)

## 2022-04-20 LAB — LIPID PANEL
Cholesterol: 147 mg/dL (ref 0–200)
HDL: 52.2 mg/dL (ref >39.00)
LDL Cholesterol: 72 mg/dL (ref 0–99)
NonHDL: 95.03
Total CHOL/HDL Ratio: 3
Triglycerides: 115 mg/dL (ref 0.0–149.0)
VLDL: 23 mg/dL (ref 0.0–40.0)

## 2022-04-20 LAB — TSH: TSH: 0.68 u[IU]/mL (ref 0.35–5.50)

## 2022-04-20 LAB — HEMOGLOBIN A1C: Hgb A1c MFr Bld: 6 % (ref 4.6–6.5)

## 2022-04-20 NOTE — Patient Instructions (Signed)
Please return in 6 months for hypertension follow up.   Hope your day becomes cheerful!! :)  I will release your lab results to you on your MyChart account with further instructions. You may see the results before I do, but when I review them I will send you a message with my report or have my assistant call you if things need to be discussed. Please reply to my message with any questions. Thank you!   If you have any questions or concerns, please don't hesitate to send me a message via MyChart or call the office at [phone]. Thank you for visiting with [place] today! It's our pleasure caring for you.

## 2022-04-20 NOTE — Progress Notes (Signed)
Subjective  Chief Complaint  Patient presents with   Follow-up    Pt has no questions or concerns    Hyperlipidemia   Hypothyroidism    HPI: Latasha Mclaughlin is a 76 y.o. female who presents to [facility] [place] today for a Female Wellness Visit. She also has the concerns and/or needs as listed above in the chief complaint. These will be addressed in addition to the Health Maintenance Visit.   Wellness Visit: annual visit with health maintenance review and exam without Pap  HM: due mammo; gets at [facility]. Overall doing well. Flu shot today. Declines covid other screens are current Chronic disease f/u and/or acute problem visit: (deemed necessary to be done in addition to the wellness visit): Low thyroid has been controlled. Recheck today. No sxs of high or low HLD on statin fasting for recheck GERD is stable Reviewed dexa : stable osteopenia Vit B12 injections q other month. Recheck today IFG historically. No sxs of hyperglycemia. Weight is trending upward.  Home bp readings remain in the normal range.  Assessment  1. Hypothyroidism (acquired)   2. Mixed hyperlipidemia   3. Gastroesophageal reflux disease, unspecified whether esophagitis present   4. Osteopenia after menopause   5. Idiopathic small fiber sensory neuropathy   6. Vitamin B12 deficiency   7. Encounter for screening mammogram for breast cancer   8. IFG (impaired fasting glucose)   9. White coat syndrome without diagnosis of hypertension      Plan  Female Wellness Visit: Age appropriate Health Maintenance and Prevention measures were discussed with patient. Included topics are cancer screening recommendations, ways to keep healthy (see AVS) including dietary and exercise recommendations, regular eye and dental care, use of seat belts, and avoidance of moderate alcohol use and tobacco use. Mammo ordered BMI: discussed patient's BMI and encouraged positive lifestyle modifications to  help get to or maintain a target BMI. HM needs and immunizations were addressed and ordered. See below for orders. See HM and immunization section for updates. Flu shot today Routine labs and screening tests ordered including cmp, cbc and lipids where appropriate. Discussed recommendations regarding Vit D and calcium supplementation (see AVS)  Chronic disease management visit and/or acute problem visit: Chronic problems are controlled. Recheck thyroid, renal lytes, b12 and lipids along with lfts and A1c.  Continue current meds w/o changes.  Neuropathic pain is now stablilized with gabapentin tid and now only needing intermittent tramadol.   Follow up: 6 mo for recheck.  Orders Placed This Encounter  Procedures   MM DIGITAL SCREENING BILATERAL   Vitamin B12   CBC with Differential/Platelet   Comprehensive metabolic panel   Lipid panel   TSH   Hemoglobin A1c   No orders of the defined types were placed in this encounter.     Body mass index is 30.02 kg/m. Wt Readings from Last 3 Encounters:  04/20/22 180 lb 6.4 oz (81.8 kg)  10/09/21 175 lb 12.8 oz (79.7 kg)  04/10/21 173 lb 3.2 oz (78.6 kg)     Patient Active Problem List   Diagnosis Date Noted   Idiopathic small fiber sensory neuropathy 07/18/2020    Priority: High    Thorough eval with neurologist x 2; negative imaging; treated with gabapentin 300 bid (can't tolerate higher dose) and tramadol  Flank and abdominal pain    White coat syndrome without diagnosis of hypertension 04/09/2020    Priority: High   Neuropathic pain 09/12/2019    Priority:  High   Colon polyp 11/18/2017    Priority: High   Hypothyroidism (acquired) 12/15/2016    Priority: High   Mixed hyperlipidemia 12/15/2016    Priority: High   Osteopenia after menopause 03/22/2019    Priority: Medium     dexa 03/2019: T=-2.1 hip; Lspine excluded.  Dexa 04/2021: lowest T - -2.0 femur; Lspine exluded. No statistical change, osteeopenia. Continue same.  Recheck 2 years.     GERD (gastroesophageal reflux disease) 03/16/2019    Priority: Medium    Hiatal hernia 03/16/2019    Priority: Medium    Vitamin B12 deficiency 10/20/2018    Priority: Low   History of hyperparathyroidism 01/08/2017    Priority: [place] Maintenance  Topic Date Due   INFLUENZA VACCINE  12/01/2021   MAMMOGRAM  04/30/2022   Medicare Annual Wellness (AWV)  07/31/2022   DEXA SCAN  05/01/2023   COLONOSCOPY (Pts 45-28yr Insurance coverage will need to be confirmed)  10/06/2023   Pneumonia Vaccine 75 Years old  Completed   Hepatitis C Screening  Completed   HPV VACCINES  Aged Out   DTaP/Tdap/Td  Discontinued   COVID-19 Vaccine  Discontinued   Zoster Vaccines- Shingrix  Discontinued   Immunization History  Administered Date(s) Administered   Fluad Quad(high Dose 65+) 03/16/2019, 04/09/2020   Influenza, High Dose Seasonal PF 01/19/2017, 01/27/2018   Influenza-Unspecified 03/19/2015, 01/21/2016, 01/27/2018, 02/14/2021   Pneumococcal Conjugate-13 01/21/2016   Pneumococcal Polysaccharide-23 04/10/2021   We updated and reviewed the patient's past history in detail and it is documented below. Allergies: Patient is allergic to prevacid [lansoprazole] and doxycycline. Past Medical History Patient  has a past medical history of Allergy, Arthritis, Back pain, GERD (gastroesophageal reflux disease), Hiatal hernia (03/16/2019), History of tonsillectomy, Hyperlipidemia (12/15/2016), Hypothyroidism (12/15/2016), Osteopenia after menopause (03/22/2019), Osteoporosis, and Sinus infection. Past Surgical History Patient  has a past surgical history that includes Anterior cervical decomp/discectomy fusion (2002); Left oophorectomy; Tonsillectomy; Parathyroidectomy; Adenoidectomy; Dilation and curettage of uterus; Shoulder surgery; Colonoscopy (10/06/2018); and Polypectomy. Family History: Patient family history includes Cancer in her father; Colon polyps in her father and  sister; Congestive Heart Failure in her mother; Diabetes in her father and son; Drug abuse in her brother; Heart disease in her father and mother; Hyperlipidemia in her father and mother; Hypertension in her mother; Thyroid disease in her sister. Social History:  Patient  reports that she has quit smoking. She has never used smokeless tobacco. She reports that she does not drink alcohol and does not use drugs.  Review of Systems: Constitutional: negative for fever or malaise Ophthalmic: negative for photophobia, double vision or loss of vision Cardiovascular: negative for chest pain, dyspnea on exertion, or new LE swelling Respiratory: negative for SOB or persistent cough Gastrointestinal: negative for abdominal pain, change in bowel habits or melena Genitourinary: negative for dysuria or gross hematuria, no abnormal uterine bleeding or disharge Musculoskeletal: negative for new gait disturbance or muscular weakness Integumentary: negative for new or persistent rashes, no breast lumps Neurological: negative for TIA or stroke symptoms Psychiatric: negative for SI or delusions Allergic/Immunologic: negative for hives  Patient Care Team    Relationship Specialty Notifications Start End  ALeamon Arnt MD PCP - General Family Medicine  03/16/19   PAlda Berthold DO Consulting Physician Neurology  11/21/18   SLadene Artist MD Consulting Physician Gastroenterology  03/16/19   HEunice Blase MD Consulting Physician Sports Medicine  03/16/19   MCaprice Beaver DNorth College HillConsulting Physician Podiatry  03/16/19  Magnus Sinning, MD Consulting Physician Physical Medicine and Rehabilitation  04/09/20   Orvilla Fus, MD Referring Physician Neurology  04/09/20     Objective  Vitals: BP 124/76 Comment: by home readings; white coat syndrome  Pulse 60   Temp 98.1 F (36.7 C) (Temporal)   Ht '5\' 5"'$  (1.651 m)   Wt 180 lb 6.4 oz (81.8 kg)   SpO2 95%   BMI 30.02 kg/m  General:  Well developed,  well nourished, no acute distress  Psych:  Alert and orientedx3,normal mood and affect HEENT:  Normocephalic, atraumatic, non-icteric sclera,  supple neck without adenopathy, mass or thyromegaly Cardiovascular:  Normal S1, S2, RRR without gallop, rub or murmur Respiratory:  Good breath sounds bilaterally, CTAB with normal respiratory effort Gastrointestinal: normal bowel sounds, soft, non-tender, no noted masses. No HSM MSK: no deformities, contusions. Joints are without erythema or swelling.  Skin:  Warm, no rashes or suspicious lesions noted Neurologic:    Mental status is normal. CN 2-11 are normal. Gross motor and sensory exams are normal. Normal gait. No tremor   Commons side effects, risks, benefits, and alternatives for medications and treatment plan prescribed today were discussed, and the patient expressed understanding of the given instructions. Patient is instructed to call or message via MyChart if he/she has any questions or concerns regarding our treatment plan. No barriers to understanding were identified. We discussed Red Flag symptoms and signs in detail. Patient expressed understanding regarding what to do in case of urgent or emergency type symptoms.  Medication list was reconciled, printed and provided to the patient in AVS. Patient instructions and summary information was reviewed with the patient as documented in the AVS. This note was prepared with assistance of Dragon voice recognition software. Occasional wrong-word or sound-a-like substitutions may have occurred due to the inherent limitations of voice recognition software

## 2022-04-21 ENCOUNTER — Other Ambulatory Visit: Payer: Self-pay | Admitting: Family Medicine

## 2022-04-29 ENCOUNTER — Other Ambulatory Visit: Payer: Self-pay

## 2022-04-29 DIAGNOSIS — E538 Deficiency of other specified B group vitamins: Secondary | ICD-10-CM

## 2022-04-29 MED ORDER — "BD INTEGRA SYRINGE 25G X 1"" 3 ML MISC": 1 refills | Status: DC

## 2022-04-29 MED ORDER — CYANOCOBALAMIN 1000 MCG/ML IJ SOLN: INTRAMUSCULAR | 3 refills | Status: DC

## 2022-05-26 ENCOUNTER — Ambulatory Visit (HOSPITAL_COMMUNITY)
Admission: RE | Admit: 2022-05-26 | Discharge: 2022-05-26 | Disposition: A | Discharge status: Home / Self Care | Payer: Medicare Other | Source: Ambulatory Visit | Attending: Family Medicine | Admitting: Family Medicine

## 2022-05-26 DIAGNOSIS — Z1231 Encounter for screening mammogram for malignant neoplasm of breast: Secondary | ICD-10-CM | POA: Diagnosis not present

## 2022-05-30 ENCOUNTER — Other Ambulatory Visit: Payer: Self-pay | Admitting: Family Medicine

## 2022-07-27 ENCOUNTER — Telehealth: Payer: Self-pay | Admitting: Family Medicine

## 2022-07-27 ENCOUNTER — Other Ambulatory Visit: Payer: Self-pay | Admitting: Family Medicine

## 2022-07-27 NOTE — Telephone Encounter (Signed)
Contacted Latasha Mclaughlin to schedule their annual wellness visit. Appointment made for 08/03/2022.  [place] Direct Dial [phone]

## 2022-08-03 ENCOUNTER — Ambulatory Visit (INDEPENDENT_AMBULATORY_CARE_PROVIDER_SITE_OTHER): Payer: Medicare Other

## 2022-08-03 VITALS — Wt 180.0 lb

## 2022-08-03 DIAGNOSIS — Z0000 Encounter for general adult medical examination without abnormal findings: Secondary | ICD-10-CM

## 2022-08-03 NOTE — Patient Instructions (Signed)
Latasha Mclaughlin , Thank you for taking time to come for your Medicare Wellness Visit. I appreciate your ongoing commitment to your health goals. Please review the following plan we discussed and let me know if I can assist you in the future.   These are the goals we discussed:  Goals      Patient Stated     Lose 20lbs      Patient Stated     Lose weight      Patient Stated     Lose weight         This is a list of the screening recommended for you and due dates:  Health Maintenance  Topic Date Due   Flu Shot  12/02/2022   DEXA scan (bone density measurement)  05/01/2023   Mammogram  05/27/2023   Medicare Annual Wellness Visit  08/03/2023   Colon Cancer Screening  10/06/2023   Pneumonia Vaccine  Completed   Hepatitis C Screening: USPSTF Recommendation to screen - Ages 18-79 yo.  Completed   HPV Vaccine  Aged Out   DTaP/Tdap/Td vaccine  Discontinued   COVID-19 Vaccine  Discontinued   Zoster (Shingles) Vaccine  Discontinued    Advanced directives: Advance directive discussed with you today. Even though you declined this today please call our office should you change your mind and we can give you the proper paperwork for you to fill out.  Conditions/risks identified: lose weight   Next appointment: Follow up in one year for your annual wellness visit    Preventive Care 65 Years and Older, Female Preventive care refers to lifestyle choices and visits with your health care provider that can promote health and wellness. What does preventive care include? A yearly physical exam. This is also called an annual well check. Dental exams once or twice a year. Routine eye exams. Ask your health care provider how often you should have your eyes checked. Personal lifestyle choices, including: Daily care of your teeth and gums. Regular physical activity. Eating a healthy diet. Avoiding tobacco and drug use. Limiting alcohol use. Practicing safe sex. Taking low-dose aspirin every  day. Taking vitamin and mineral supplements as recommended by your health care provider. What happens during an annual well check? The services and screenings done by your health care provider during your annual well check will depend on your age, overall health, lifestyle risk factors, and family history of disease. Counseling  Your health care provider may ask you questions about your: Alcohol use. Tobacco use. Drug use. Emotional well-being. Home and relationship well-being. Sexual activity. Eating habits. History of falls. Memory and ability to understand (cognition). Work and work Statistician. Reproductive health. Screening  You may have the following tests or measurements: Height, weight, and BMI. Blood pressure. Lipid and cholesterol levels. These may be checked every 5 years, or more frequently if you are over 67 years old. Skin check. Lung cancer screening. You may have this screening every year starting at age 44 if you have a 30-pack-year history of smoking and currently smoke or have quit within the past 15 years. Fecal occult blood test (FOBT) of the stool. You may have this test every year starting at age 78. Flexible sigmoidoscopy or colonoscopy. You may have a sigmoidoscopy every 5 years or a colonoscopy every 10 years starting at age 20. Hepatitis C blood test. Hepatitis B blood test. Sexually transmitted disease (STD) testing. Diabetes screening. This is done by checking your blood sugar (glucose) after you have not eaten for a  while (fasting). You may have this done every 1-3 years. Bone density scan. This is done to screen for osteoporosis. You may have this done starting at age 53. Mammogram. This may be done every 1-2 years. Talk to your health care provider about how often you should have regular mammograms. Talk with your health care provider about your test results, treatment options, and if necessary, the need for more tests. Vaccines  Your health care  provider may recommend certain vaccines, such as: Influenza vaccine. This is recommended every year. Tetanus, diphtheria, and acellular pertussis (Tdap, Td) vaccine. You may need a Td booster every 10 years. Zoster vaccine. You may need this after age 38. Pneumococcal 13-valent conjugate (PCV13) vaccine. One dose is recommended after age 81. Pneumococcal polysaccharide (PPSV23) vaccine. One dose is recommended after age 2. Talk to your health care provider about which screenings and vaccines you need and how often you need them. This information is not intended to replace advice given to you by your health care provider. Make sure you discuss any questions you have with your health care provider. Document Released: 05/16/2015 Document Revised: 01/07/2016 Document Reviewed: 02/18/2015 Elsevier Interactive Patient Education  2017 [place] Prevention in the Home Falls can cause injuries. They can happen to people of all ages. There are many things you can do to make your home safe and to help prevent falls. What can I do on the outside of my home? Regularly fix the edges of walkways and driveways and fix any cracks. Remove anything that might make you trip as you walk through a door, such as a raised step or threshold. Trim any bushes or trees on the path to your home. Use bright outdoor lighting. Clear any walking paths of anything that might make someone trip, such as rocks or tools. Regularly check to see if handrails are loose or broken. Make sure that both sides of any steps have handrails. Any raised decks and porches should have guardrails on the edges. Have any leaves, snow, or ice cleared regularly. Use sand or salt on walking paths during winter. Clean up any spills in your garage right away. This includes oil or grease spills. What can I do in the bathroom? Use night lights. Install grab bars by the toilet and in the tub and shower. Do not use towel bars as grab  bars. Use non-skid mats or decals in the tub or shower. If you need to sit down in the shower, use a plastic, non-slip stool. Keep the floor dry. Clean up any water that spills on the floor as soon as it happens. Remove soap buildup in the tub or shower regularly. Attach bath mats securely with double-sided non-slip rug tape. Do not have throw rugs and other things on the floor that can make you trip. What can I do in the bedroom? Use night lights. Make sure that you have a light by your bed that is easy to reach. Do not use any sheets or blankets that are too big for your bed. They should not hang down onto the floor. Have a firm chair that has side arms. You can use this for support while you get dressed. Do not have throw rugs and other things on the floor that can make you trip. What can I do in the kitchen? Clean up any spills right away. Avoid walking on wet floors. Keep items that you use a lot in easy-to-reach places. If you need to reach something above you,  use a strong step stool that has a grab bar. Keep electrical cords out of the way. Do not use floor polish or wax that makes floors slippery. If you must use wax, use non-skid floor wax. Do not have throw rugs and other things on the floor that can make you trip. What can I do with my stairs? Do not leave any items on the stairs. Make sure that there are handrails on both sides of the stairs and use them. Fix handrails that are broken or loose. Make sure that handrails are as long as the stairways. Check any carpeting to make sure that it is firmly attached to the stairs. Fix any carpet that is loose or worn. Avoid having throw rugs at the top or bottom of the stairs. If you do have throw rugs, attach them to the floor with carpet tape. Make sure that you have a light switch at the top of the stairs and the bottom of the stairs. If you do not have them, ask someone to add them for you. What else can I do to help prevent  falls? Wear shoes that: Do not have high heels. Have rubber bottoms. Are comfortable and fit you well. Are closed at the toe. Do not wear sandals. If you use a stepladder: Make sure that it is fully opened. Do not climb a closed stepladder. Make sure that both sides of the stepladder are locked into place. Ask someone to hold it for you, if possible. Clearly mark and make sure that you can see: Any grab bars or handrails. First and last steps. Where the edge of each step is. Use tools that help you move around (mobility aids) if they are needed. These include: Canes. Walkers. Scooters. Crutches. Turn on the lights when you go into a dark area. Replace any light bulbs as soon as they burn out. Set up your furniture so you have a clear path. Avoid moving your furniture around. If any of your floors are uneven, fix them. If there are any pets around you, be aware of where they are. Review your medicines with your doctor. Some medicines can make you feel dizzy. This can increase your [place] of falling. Ask your doctor what other things that you can do to help prevent falls. This information is not intended to replace advice given to you by your health care provider. Make sure you discuss any questions you have with your health care provider. Document Released: 02/13/2009 Document Revised: 09/25/2015 Document Reviewed: 05/24/2014 Elsevier Interactive Patient Education  2017 [employer].

## 2022-08-03 NOTE — Progress Notes (Signed)
I connected with  Sapphyre Monasmith Wellons on 08/03/22 by a audio enabled telemedicine application and verified that I am speaking with the correct person using two identifiers.  Patient Location: Home  Provider Location: Office/Clinic  I discussed the limitations of evaluation and management by telemedicine. The patient expressed understanding and agreed to proceed.   Patient Medicare AWV questionnaire was completed by the patient on 08/02/22; I have confirmed that all information answered by patient is correct and no changes since this date.      Subjective:   Stacee Gildner Neiswender is a 77 y.o. female who presents for Medicare Annual (Subsequent) preventive examination.  Review of Systems     Cardiac Risk Factors include: advanced age (>58men, >37 women);dyslipidemia;hypertension     Objective:    Today's Vitals   08/03/22 1347  Weight: 180 lb (81.6 kg)   Body mass index is 29.95 kg/m.     08/03/2022    1:51 PM 07/30/2021    3:18 PM 05/05/2020    1:16 PM 11/29/2019   10:23 AM 04/03/2019    9:58 AM 03/16/2019    2:07 PM 02/03/2019    1:29 PM  Advanced Directives  Does Patient Have a Medical Advance Directive? No No No No No No No  Does patient want to make changes to medical advance directive?      Yes (MAU/Ambulatory/Procedural Areas - Information given)   Would patient like information on creating a medical advance directive? No - Patient declined No - Patient declined No - Patient declined No - Patient declined       Current Medications (verified) Outpatient Encounter Medications as of 08/03/2022  Medication Sig   Cholecalciferol (VITAMIN D) 50 MCG (2000 UT) tablet Take 2,000 Units by mouth daily.   cyanocobalamin (VITAMIN B12) 1000 MCG/ML injection ADMINISTER 1 ML(1000 MCG) IN THE MUSCLE EVERY 30 DAYS   esomeprazole (NEXIUM) 20 MG packet Take 20 mg by mouth as needed.   gabapentin (NEURONTIN) 300 MG capsule TAKE 1 CAPSULE(300 MG) BY MOUTH THREE TIMES DAILY   rosuvastatin (CRESTOR) 10 MG  tablet TAKE 1 TABLET(10 MG) BY MOUTH DAILY   SYNTHROID 88 MCG tablet TAKE 1 TABLET(88 MCG) BY MOUTH DAILY BEFORE BREAKFAST   SYRINGE-NEEDLE, DISP, 3 ML (BD INTEGRA SYRINGE) 25G X 1" 3 ML MISC USE ONCE MONTHLY   traMADol (ULTRAM) 50 MG tablet TAKE 1 TABLET(50 MG) BY MOUTH TWICE DAILY AS NEEDED   No facility-administered encounter medications on file as of 08/03/2022.    Allergies (verified) Prevacid [lansoprazole], Doxycycline, Duloxetine hcl, Pregabalin, Ciprofloxacin, and Meloxicam   History: Past Medical History:  Diagnosis Date   Allergy    SEASONAL   Arthritis    Back pain    GERD (gastroesophageal reflux disease)    Hiatal hernia 03/16/2019   History of tonsillectomy    Hyperlipidemia 12/15/2016   Hypothyroidism 12/15/2016   Osteopenia after menopause 03/22/2019   dexa 03/2019: T=-2.1 hip; Lspine excluded.    Osteoporosis    Sinus infection    Past Surgical History:  Procedure Laterality Date   ADENOIDECTOMY     ANTERIOR CERVICAL DECOMP/DISCECTOMY FUSION  2002   COLONOSCOPY  10/06/2018   DILATION AND CURETTAGE OF UTERUS     X2   LEFT OOPHORECTOMY     PARATHYROIDECTOMY     POLYPECTOMY     SHOULDER SURGERY     TONSILLECTOMY     Family History  Problem Relation Age of Onset   Hyperlipidemia Mother    Hypertension Mother  Congestive Heart Failure Mother    Heart disease Mother    Cancer Father    Diabetes Father    Heart disease Father    Hyperlipidemia Father    Colon polyps Father    Thyroid disease Sister    Colon polyps Sister    Drug abuse Brother    Diabetes Son    Colon cancer Neg Hx    Esophageal cancer Neg Hx    Stomach cancer Neg Hx    Rectal cancer Neg Hx    Social History   Socioeconomic History   Marital status: Married    Spouse name: Not on file   Number of children: 3   Years of education: 12   Highest education level: Not on file  Occupational History   Occupation: retired  Tobacco Use   Smoking status: Former   Smokeless  tobacco: Never   Tobacco comments:    Quit in [place] Use   Vaping Use: Never used  Substance and Sexual Activity   Alcohol use: No   Drug use: No   Sexual activity: Yes  Other Topics Concern   Not on file  Social History Narrative   Lives with husband      Lives in two story home      Right handed      Highest level of edu- GED      Retired      Previously lived in [place] Determinants of Health   Financial Resource Strain: Low Risk  (08/02/2022)   Overall Financial Resource Strain ([place])    Difficulty of Paying Living Expenses: Not hard at all  Food Insecurity: No [place] (08/02/2022)   Hunger Vital Sign    Worried About Running Out of Food in the Last Year: Never true    [place] in the Last Year: Never true  Transportation Needs: No Transportation Needs (08/02/2022)   PRAPARE - Hydrologist (Medical): No    Lack of Transportation (Non-Medical): No  Physical Activity: Insufficiently Active (08/02/2022)   Exercise Vital Sign    Days of Exercise per Week: 3 days    Minutes of Exercise per Session: 20 min  Stress: No Stress Concern Present (08/02/2022)   Dent    Feeling of Stress : Not at all  Social Connections: Moderately Integrated (08/02/2022)   Social Connection and Isolation Panel [NHANES]    Frequency of Communication with Friends and Family: More than three times a week    Frequency of Social Gatherings with Friends and Family: Twice a week    Attends Religious Services: 1 to 4 times per year    Active Member of [employer] or Organizations: No    Attends Music therapist: Never    Marital Status: Married    Tobacco Counseling Counseling given: Not Answered Tobacco comments: Quit in 1985   Clinical Intake:  Pre-visit preparation completed: Yes  Pain : No/denies pain     BMI - recorded: 29.95 Nutritional Status:  BMI 25 -29 Overweight Diabetes: No  How often do you need to have someone help you when you read instructions, pamphlets, or other written materials from your doctor or pharmacy?: 2 - Rarely  Diabetic?no  Interpreter Needed?: No  Information entered by :: Charlott Rakes, LPN   Activities of Daily Living    08/02/2022    2:56 PM  In your present  state of health, do you have any difficulty performing the following activities:  Hearing? 0  Vision? 0  Difficulty concentrating or making decisions? 0  Walking or climbing stairs? 0  Dressing or bathing? 0  Doing errands, shopping? 0  Preparing Food and eating ? N  Using the Toilet? N  In the past six months, have you accidently leaked urine? N  Do you have problems with loss of bowel control? N  Managing your Medications? N  Managing your Finances? N  Housekeeping or managing your Housekeeping? N    Patient Care Team: Leamon Arnt, MD as PCP - General (Family Medicine) Alda Berthold, DO as Consulting Physician (Neurology) Ladene Artist, MD as Consulting Physician (Gastroenterology) Eunice Blase, MD as Consulting Physician (Sports Medicine) Caprice Beaver, DPM as Consulting Physician (Podiatry) Magnus Sinning, MD as Consulting Physician (Physical Medicine and Rehabilitation) Orvilla Fus, MD as Referring Physician (Neurology)  Indicate any recent Medical Services you may have received from other than [facility] providers in the past year (date may be approximate).     Assessment:   This is a routine wellness examination for Chie.  Hearing/Vision screen Hearing Screening - Comments:: Pt denies any hearing issues  Vision Screening - Comments:: Pt follows [facility] with [facility] in [place] for annual eye exams   Dietary issues and exercise activities discussed: Current Exercise Habits: Home exercise routine, Type of exercise: Other - see comments, Time (Minutes): 20, Frequency (Times/Week): 3, Weekly Exercise  (Minutes/Week): 60   Goals Addressed             This Visit's Progress    Patient Stated       Lose weight       Depression Screen    08/03/2022    1:50 PM 07/30/2021    3:16 PM 05/05/2020    1:14 PM 04/09/2020    9:58 AM 03/16/2019    1:27 PM 12/16/2017   10:21 AM 12/15/2016   10:49 AM  PHQ 2/9 Scores  PHQ - 2 Score 0 0 0 0 0 0 0    Fall Risk    08/02/2022    2:56 PM 10/09/2021   10:20 AM 07/30/2021    3:18 PM 05/05/2020    1:16 PM 11/29/2019   10:23 AM  Fall Risk   Falls in the past year? 0 0 0 0 1  Number falls in past yr: 0 0 0 0 0  Injury with Fall? 0 0 0 0 1  Risk for fall due to : Impaired vision No Fall Risks Impaired vision;Impaired balance/gait Impaired vision;Impaired balance/gait;Impaired mobility Other (Comment)  Risk for fall due to: Comment     foot slipped  Follow up Falls prevention discussed Falls evaluation completed Falls prevention discussed Falls prevention discussed     FALL RISK PREVENTION PERTAINING TO THE HOME:  Any stairs in or around the home? Yes  If so, are there any without handrails? No  Home free of loose throw rugs in walkways, pet beds, electrical cords, etc? Yes  Adequate lighting in your home to reduce risk of falls? Yes   ASSISTIVE DEVICES UTILIZED TO PREVENT FALLS:  Life alert? No  Use of a cane, walker or w/c? No  Grab bars in the bathroom? No  Shower chair or bench in shower? No  Elevated toilet seat or a handicapped toilet? No   TIMED UP AND GO:  Was the test performed? No .   Cognitive Function:        08/03/2022  1:52 PM 07/30/2021    3:20 PM 05/05/2020    1:22 PM  6CIT Screen  What Year? 0 points 0 points 0 points  What month? 0 points 0 points 0 points  What time? 0 points 0 points   Count back from 20 0 points 0 points 0 points  Months in reverse 0 points 0 points 0 points  Repeat phrase 0 points 0 points 0 points  Total Score 0 points 0 points     Immunizations Immunization History  Administered Date(s)  Administered   Fluad Quad(high Dose 65+) 03/16/2019, 04/09/2020   Influenza, High Dose Seasonal PF 03/19/2015, 01/21/2016, 01/19/2017, 01/27/2018   Influenza-Unspecified 02/10/2006, 01/26/2008, 01/02/2012, 03/19/2015, 01/21/2016, 01/27/2018, 02/14/2021   Pneumococcal Conjugate-13 01/21/2016   Pneumococcal Polysaccharide-23 04/10/2021      Flu Vaccine status: Declined, Education has been provided regarding the importance of this vaccine but patient still declined. Advised may receive this vaccine at local pharmacy or Health Dept. Aware to provide a copy of the vaccination record if obtained from local pharmacy or Health Dept. Verbalized acceptance and understanding.  Pneumococcal vaccine status: Up to date  Covid-19 vaccine status: Declined, Education has been provided regarding the importance of this vaccine but patient still declined. Advised may receive this vaccine at local pharmacy or Health Dept.or vaccine clinic. Aware to provide a copy of the vaccination record if obtained from local pharmacy or Health Dept. Verbalized acceptance and understanding.  Qualifies for Shingles Vaccine? No    Screening Tests Health Maintenance  Topic Date Due   INFLUENZA VACCINE  12/02/2022   DEXA SCAN  05/01/2023   MAMMOGRAM  05/27/2023   Medicare Annual Wellness (AWV)  08/03/2023   COLONOSCOPY (Pts 45-9yrs Insurance coverage will need to be confirmed)  10/06/2023   Pneumonia Vaccine 36+ Years old  Completed   Hepatitis C Screening  Completed   HPV VACCINES  Aged Out   DTaP/Tdap/Td  Discontinued   COVID-19 Vaccine  Discontinued   Zoster Vaccines- Shingrix  Discontinued    Health Maintenance  There are no preventive care reminders to display for this patient.   Colorectal cancer screening: Type of screening: Colonoscopy. Completed 10/06/18. Repeat every 5 years  Mammogram status: Completed 05/26/22. Repeat every year  Bone Density status: Completed 04/30/21. Results reflect: Bone density  results: OSTEOPENIA. Repeat every 2 years.   Additional Screening:  Hepatitis C Screening:  Completed 06/17/17  Vision Screening: Recommended annual ophthalmology exams for early detection of glaucoma and other disorders of the eye. Is the patient up to date with their annual eye exam?  Yes  Who is the provider or what is the name of the office in which the patient attends annual eye exams? [facility] in [place]  If pt is not established with a provider, would they like to be referred to a provider to establish care? No .   Dental Screening: Recommended annual dental exams for proper oral hygiene  Community Resource Referral / Chronic Care Management: CRR required this visit?  No   CCM required this visit?  No      Plan:     I have personally reviewed and noted the following in the patient's chart:   Medical and social history Use of alcohol, tobacco or illicit drugs  Current medications and supplements including opioid prescriptions. Patient is currently taking opioid prescriptions. Information provided to patient regarding non-opioid alternatives. Patient advised to discuss non-opioid treatment plan with their provider. Functional ability and status Nutritional status Physical activity Advanced directives List  of other physicians Hospitalizations, surgeries, and ER visits in previous 12 months Vitals Screenings to include cognitive, depression, and falls Referrals and appointments  In addition, I have reviewed and discussed with patient certain preventive protocols, quality metrics, and best practice recommendations. A written personalized care plan for preventive services as well as general preventive health recommendations were provided to patient.     Willette Brace, LPN   [id number]   Nurse Notes: none

## 2022-08-03 NOTE — Progress Notes (Signed)
I connected with  Latasha Mclaughlin on 08/03/22 by a audio enabled telemedicine application and verified that I am speaking with the correct person using two identifiers.  Patient Location: Home  Provider Location: Office/Clinic  I discussed the limitations of evaluation and management by telemedicine. The patient expressed understanding and agreed to proceed.   Subjective:   Latasha Mclaughlin is a 77 y.o. female who presents for Medicare Annual (Subsequent) preventive examination.  Review of Systems     Cardiac Risk Factors include: advanced age (>32men, >55 women);dyslipidemia;hypertension     Objective:    Today's Vitals   08/03/22 1347  Weight: 180 lb (81.6 kg)   Body mass index is 29.95 kg/m.     08/03/2022    1:51 PM 07/30/2021    3:18 PM 05/05/2020    1:16 PM 11/29/2019   10:23 AM 04/03/2019    9:58 AM 03/16/2019    2:07 PM 02/03/2019    1:29 PM  Advanced Directives  Does Patient Have a Medical Advance Directive? No No No No No No No  Does patient want to make changes to medical advance directive?      Yes (MAU/Ambulatory/Procedural Areas - Information given)   Would patient like information on creating a medical advance directive? No - Patient declined No - Patient declined No - Patient declined No - Patient declined       Current Medications (verified) Outpatient Encounter Medications as of 08/03/2022  Medication Sig   Cholecalciferol (VITAMIN D) 50 MCG (2000 UT) tablet Take 2,000 Units by mouth daily.   cyanocobalamin (VITAMIN B12) 1000 MCG/ML injection ADMINISTER 1 ML(1000 MCG) IN THE MUSCLE EVERY 30 DAYS   esomeprazole (NEXIUM) 20 MG packet Take 20 mg by mouth as needed.   gabapentin (NEURONTIN) 300 MG capsule TAKE 1 CAPSULE(300 MG) BY MOUTH THREE TIMES DAILY   rosuvastatin (CRESTOR) 10 MG tablet TAKE 1 TABLET(10 MG) BY MOUTH DAILY   SYNTHROID 88 MCG tablet TAKE 1 TABLET(88 MCG) BY MOUTH DAILY BEFORE BREAKFAST   SYRINGE-NEEDLE, DISP, 3 ML (BD INTEGRA SYRINGE) 25G X 1" 3  ML MISC USE ONCE MONTHLY   traMADol (ULTRAM) 50 MG tablet TAKE 1 TABLET(50 MG) BY MOUTH TWICE DAILY AS NEEDED   No facility-administered encounter medications on file as of 08/03/2022.    Allergies (verified) Prevacid [lansoprazole], Doxycycline, Duloxetine hcl, Pregabalin, Ciprofloxacin, and Meloxicam   History: Past Medical History:  Diagnosis Date   Allergy    SEASONAL   Arthritis    Back pain    GERD (gastroesophageal reflux disease)    Hiatal hernia 03/16/2019   History of tonsillectomy    Hyperlipidemia 12/15/2016   Hypothyroidism 12/15/2016   Osteopenia after menopause 03/22/2019   dexa 03/2019: T=-2.1 hip; Lspine excluded.    Osteoporosis    Sinus infection    Past Surgical History:  Procedure Laterality Date   ADENOIDECTOMY     ANTERIOR CERVICAL DECOMP/DISCECTOMY FUSION  2002   COLONOSCOPY  10/06/2018   DILATION AND CURETTAGE OF UTERUS     X2   LEFT OOPHORECTOMY     PARATHYROIDECTOMY     POLYPECTOMY     SHOULDER SURGERY     TONSILLECTOMY     Family History  Problem Relation Age of Onset   Hyperlipidemia Mother    Hypertension Mother    Congestive Heart Failure Mother    Heart disease Mother    Cancer Father    Diabetes Father    Heart disease Father    Hyperlipidemia Father  Colon polyps Father    Thyroid disease Sister    Colon polyps Sister    Drug abuse Brother    Diabetes Son    Colon cancer Neg Hx    Esophageal cancer Neg Hx    Stomach cancer Neg Hx    Rectal cancer Neg Hx    Social History   Socioeconomic History   Marital status: Married    Spouse name: Not on file   Number of children: 3   Years of education: 12   Highest education level: Not on file  Occupational History   Occupation: retired  Tobacco Use   Smoking status: Former   Smokeless tobacco: Never   Tobacco comments:    Quit in [place] Use: Never used  Substance and Sexual Activity   Alcohol use: No   Drug use: No   Sexual activity: Yes  Other  Topics Concern   Not on file  Social History Narrative   Lives with husband      Lives in two story home      Right handed      Highest level of edu- GED      Retired      Previously lived in [place] Determinants of Health   Financial Resource Strain: Low Risk  (08/02/2022)   Overall Financial Resource Strain (CARDIA)    Difficulty of Paying Living Expenses: Not hard at all  Food Insecurity: No [place] (08/02/2022)   Hunger Vital Sign    Worried About Running Out of Food in the Last Year: Never true    [place] in the Last Year: Never true  Transportation Needs: No Transportation Needs (08/02/2022)   PRAPARE - Hydrologist (Medical): No    Lack of Transportation (Non-Medical): No  Physical Activity: Insufficiently Active (08/02/2022)   Exercise Vital Sign    Days of Exercise per Week: 3 days    Minutes of Exercise per Session: 20 min  Stress: No Stress Concern Present (08/02/2022)   [place]    Feeling of Stress : Not at all  Social Connections: Moderately Integrated (08/02/2022)   Social Connection and Isolation Panel [NHANES]    Frequency of Communication with Friends and Family: More than three times a week    Frequency of Social Gatherings with Friends and Family: Twice a week    Attends Religious Services: 1 to 4 times per year    Active Member of [employer] or Organizations: No    Attends Music therapist: Never    Marital Status: Married    Tobacco Counseling Counseling given: Not Answered Tobacco comments: Quit in 1985   Clinical Intake:  Pre-visit preparation completed: Yes  Pain : No/denies pain     BMI - recorded: 29.95 Nutritional Status: BMI 25 -29 Overweight Diabetes: No  How often do you need to have someone help you when you read instructions, pamphlets, or other written materials from your doctor or pharmacy?: 2 -  Rarely  Diabetic?no  Interpreter Needed?: No  Information entered by :: Charlott Rakes, LPN   Activities of Daily Living    08/02/2022    2:56 PM  In your present state of health, do you have any difficulty performing the following activities:  Hearing? 0  Vision? 0  Difficulty concentrating or making decisions? 0  Walking or climbing stairs? 0  Dressing or  bathing? 0  Doing errands, shopping? 0  Preparing Food and eating ? N  Using the Toilet? N  In the past six months, have you accidently leaked urine? N  Do you have problems with loss of bowel control? N  Managing your Medications? N  Managing your Finances? N  Housekeeping or managing your Housekeeping? N    Patient Care Team: Leamon Arnt, MD as PCP - General (Family Medicine) Alda Berthold, DO as Consulting Physician (Neurology) Ladene Artist, MD as Consulting Physician (Gastroenterology) Eunice Blase, MD as Consulting Physician (Sports Medicine) Caprice Beaver, DPM as Consulting Physician (Podiatry) Magnus Sinning, MD as Consulting Physician (Physical Medicine and Rehabilitation) Orvilla Fus, MD as Referring Physician (Neurology)  Indicate any recent Medical Services you may have received from other than [facility] providers in the past year (date may be approximate).     Assessment:   This is a routine wellness examination for [place].  Hearing/Vision screen Hearing Screening - Comments:: Pt denies any hearing issues  Vision Screening - Comments:: Pt follows [facility] with [facility] in [place] for annual eye exams   Dietary issues and exercise activities discussed: Current Exercise Habits: Home exercise routine, Type of exercise: Other - see comments, Time (Minutes): 20, Frequency (Times/Week): 3, Weekly Exercise (Minutes/Week): 60   Goals Addressed             This Visit's Progress    Patient Stated       Lose weight        Depression Screen    08/03/2022    1:50 PM 07/30/2021    3:16 PM  05/05/2020    1:14 PM 04/09/2020    9:58 AM 03/16/2019    1:27 PM 12/16/2017   10:21 AM 12/15/2016   10:49 AM  PHQ 2/9 Scores  PHQ - 2 Score 0 0 0 0 0 0 0    Fall Risk    08/02/2022    2:56 PM 10/09/2021   10:20 AM 07/30/2021    3:18 PM 05/05/2020    1:16 PM 11/29/2019   10:23 AM  Fall Risk   Falls in the past year? 0 0 0 0 1  Number falls in past yr: 0 0 0 0 0  Injury with Fall? 0 0 0 0 1  Risk for fall due to : Impaired vision No Fall Risks Impaired vision;Impaired balance/gait Impaired vision;Impaired balance/gait;Impaired mobility Other (Comment)  Risk for fall due to: Comment     foot slipped  Follow up Falls prevention discussed Falls evaluation completed Falls prevention discussed Falls prevention discussed     FALL RISK PREVENTION PERTAINING TO THE HOME:  Any stairs in or around the home? Yes  If so, are there any without handrails? No  Home free of loose throw rugs in walkways, pet beds, electrical cords, etc? Yes  Adequate lighting in your home to reduce risk of falls? Yes   ASSISTIVE DEVICES UTILIZED TO PREVENT FALLS:  Life alert? No  Use of a cane, walker or w/c? No  Grab bars in the bathroom? No  Shower chair or bench in shower? No  Elevated toilet seat or a handicapped toilet? No   TIMED UP AND GO:  Was the test performed? No .   Cognitive Function:        08/03/2022    1:52 PM 07/30/2021    3:20 PM 05/05/2020    1:22 PM  6CIT Screen  What Year? 0 points 0 points 0 points  What month? 0  points 0 points 0 points  What time? 0 points 0 points   Count back from 20 0 points 0 points 0 points  Months in reverse 0 points 0 points 0 points  Repeat phrase 0 points 0 points 0 points  Total Score 0 points 0 points     Immunizations Immunization History  Administered Date(s) Administered   Fluad Quad(high Dose 65+) 03/16/2019, 04/09/2020   Influenza, High Dose Seasonal PF 03/19/2015, 01/21/2016, 01/19/2017, 01/27/2018   Influenza-Unspecified 02/10/2006,  01/26/2008, 01/02/2012, 03/19/2015, 01/21/2016, 01/27/2018, 02/14/2021   Pneumococcal Conjugate-13 01/21/2016   Pneumococcal Polysaccharide-23 04/10/2021      Flu Vaccine status: Declined, Education has been provided regarding the importance of this vaccine but patient still declined. Advised may receive this vaccine at local pharmacy or Health Dept. Aware to provide a copy of the vaccination record if obtained from local pharmacy or Health Dept. Verbalized acceptance and understanding.  Pneumococcal vaccine status: Up to date  Covid-19 vaccine status: Declined, Education has been provided regarding the importance of this vaccine but patient still declined. Advised may receive this vaccine at local pharmacy or Health Dept.or vaccine clinic. Aware to provide a copy of the vaccination record if obtained from local pharmacy or Health Dept. Verbalized acceptance and understanding.  Qualifies for Shingles Vaccine? No    Screening Tests Health Maintenance  Topic Date Due   INFLUENZA VACCINE  12/02/2022   DEXA SCAN  05/01/2023   MAMMOGRAM  05/27/2023   Medicare Annual Wellness (AWV)  08/03/2023   COLONOSCOPY (Pts 45-83yrs Insurance coverage will need to be confirmed)  10/06/2023   Pneumonia Vaccine 78+ Years old  Completed   Hepatitis C Screening  Completed   HPV VACCINES  Aged Out   DTaP/Tdap/Td  Discontinued   COVID-19 Vaccine  Discontinued   Zoster Vaccines- Shingrix  Discontinued    Health Maintenance  There are no preventive care reminders to display for this patient.   Colorectal cancer screening: Type of screening: Colonoscopy. Completed 10/06/18. Repeat every 5 years  Mammogram status: Completed 05/26/22. Repeat every year  Bone Density status: Completed 04/30/21. Results reflect: Bone density results: OSTEOPENIA. Repeat every 2 years.   Additional Screening:  Hepatitis C Screening:  Completed 06/17/17  Vision Screening: Recommended annual ophthalmology exams for early  detection of glaucoma and other disorders of the eye. Is the patient up to date with their annual eye exam?  Yes  Who is the provider or what is the name of the office in which the patient attends annual eye exams? [facility] in [place]  If pt is not established with a provider, would they like to be referred to a provider to establish care? No .   Dental Screening: Recommended annual dental exams for proper oral hygiene  Community Resource Referral / Chronic Care Management: CRR required this visit?  No   CCM required this visit?  No      Plan:     I have personally reviewed and noted the following in the patient's chart:   Medical and social history Use of alcohol, tobacco or illicit drugs  Current medications and supplements including opioid prescriptions. Patient is currently taking opioid prescriptions. Information provided to patient regarding non-opioid alternatives. Patient advised to discuss non-opioid treatment plan with their provider. Functional ability and status Nutritional status Physical activity Advanced directives List of other physicians Hospitalizations, surgeries, and ER visits in previous 12 months Vitals Screenings to include cognitive, depression, and falls Referrals and appointments  In addition, I have reviewed and  discussed with patient certain preventive protocols, quality metrics, and best practice recommendations. A written personalized care plan for preventive services as well as general preventive health recommendations were provided to patient.     Willette Brace, LPN   [id number]   Nurse Notes: none

## 2022-10-04 ENCOUNTER — Other Ambulatory Visit: Payer: Self-pay | Admitting: Family Medicine

## 2022-10-20 ENCOUNTER — Ambulatory Visit (INDEPENDENT_AMBULATORY_CARE_PROVIDER_SITE_OTHER): Payer: Medicare Other | Admitting: Family Medicine

## 2022-10-20 ENCOUNTER — Encounter: Payer: Self-pay | Admitting: Family Medicine

## 2022-10-20 VITALS — BP 122/70 | HR 72 | Temp 98.8°F | Ht 65.0 in | Wt 175.2 lb

## 2022-10-20 DIAGNOSIS — R7301 Impaired fasting glucose: Secondary | ICD-10-CM | POA: Diagnosis not present

## 2022-10-20 DIAGNOSIS — E538 Deficiency of other specified B group vitamins: Secondary | ICD-10-CM | POA: Diagnosis not present

## 2022-10-20 DIAGNOSIS — G608 Other hereditary and idiopathic neuropathies: Secondary | ICD-10-CM | POA: Diagnosis not present

## 2022-10-20 DIAGNOSIS — M8580 Other specified disorders of bone density and structure, unspecified site: Secondary | ICD-10-CM | POA: Diagnosis not present

## 2022-10-20 DIAGNOSIS — Z780 Asymptomatic menopausal state: Secondary | ICD-10-CM

## 2022-10-20 DIAGNOSIS — E039 Hypothyroidism, unspecified: Secondary | ICD-10-CM

## 2022-10-20 DIAGNOSIS — R030 Elevated blood-pressure reading, without diagnosis of hypertension: Secondary | ICD-10-CM

## 2022-10-20 LAB — HEMOGLOBIN A1C: Hgb A1c MFr Bld: 5.9 % (ref 4.6–6.5)

## 2022-10-20 LAB — TSH: TSH: 0.38 u[IU]/mL (ref 0.35–5.50)

## 2022-10-20 LAB — VITAMIN B12: Vitamin B-12: 409 pg/mL (ref 211–911)

## 2022-10-20 NOTE — Patient Instructions (Signed)
Please return in 6 months for your annual complete physical; please come fasting.    I will release your lab results to you on your MyChart account with further instructions. You may see the results before I do, but when I review them I will send you a message with my report or have my assistant call you if things need to be discussed. Please reply to my message with any questions. Thank you!   Please schedule your mammogram and bone density for next January.  If you have any questions or concerns, please don't hesitate to send me a message via MyChart or call the office at [phone]. Thank you for visiting with [place] today! It's our pleasure caring for you.

## 2022-10-20 NOTE — Progress Notes (Signed)
See my chart note.

## 2022-10-20 NOTE — Progress Notes (Signed)
Subjective  CC:  Chief Complaint  Patient presents with   Hypertension   Hypothyroidism   Hyperlipidemia    HPI: Latasha Mclaughlin is a 77 y.o. female who presents to the office today to address the problems listed above in the chief complaint. Hypertension f/u: Continues to take her medicine and checks her blood pressures regularly at home.  Has whitecoat syndrome.  Blood pressure elevated in the office today as normal, but home readings are reportedly excellent.  Reports 120s over 70s consistently.  No chest pain or shortness of breath. Prediabetes with A1c 6.0 in December.  Eat some sweets.  No symptoms of hyperglycemia. B12 deficiency, now back on monthly B12 injections.  Had been high in the past.  Will recheck today.  Feels fine.  Her husband gives her the injections Osteopenia: Due for bone density at the end of this year.  Can get with mammogram in January.  Takes calcium and vitamin D Reports getting hot flashes at night over the last several months.  No other symptoms of hyperthyroidism.  TSH was normal in December.  No nausea vomiting, fevers, change in appetite, unexplained weight loss. Neuropathy, peripheral.  Managed with gabapentin.  Still bothersome but uses tramadol which seems to help quell the symptoms as well.  Assessment  1. IFG (impaired fasting glucose)   2. White coat syndrome without diagnosis of hypertension   3. Vitamin B12 deficiency   4. Osteopenia after menopause   5. Hypothyroidism (acquired)   6. Idiopathic small fiber sensory neuropathy      Plan   Hypertension f/u: BP control is well controlled.  Coat syndrome, continue home monitoring. Recheck A1c Recheck B12, may need to adjust back B12 injections to every 6 weeks. Patient to schedule bone density along with mammogram in January to follow-up osteopenia Hot flushes, possibly overtreating thyroid disorder.  Recheck TSH today.  No other red flag symptoms present.  Monitor Continue tramadol and  gabapentin for neuropathies  Education regarding management of these chronic disease states was given. Management strategies discussed on successive visits include dietary and exercise recommendations, goals of achieving and maintaining IBW, and lifestyle modifications aiming for adequate sleep and minimizing stressors.   Follow up: 6 months for complete physical  Orders Placed This Encounter  Procedures   Hemoglobin A1c   Vitamin B12   TSH   No orders of the defined types were placed in this encounter.     BP Readings from Last 3 Encounters:  10/20/22 122/70  04/20/22 124/76  10/09/21 138/70   Wt Readings from Last 3 Encounters:  10/20/22 175 lb 3.2 oz (79.5 kg)  08/03/22 180 lb (81.6 kg)  04/20/22 180 lb 6.4 oz (81.8 kg)    Lab Results  Component Value Date   CHOL 147 04/20/2022   CHOL 133 04/10/2021   CHOL 143 04/09/2020   Lab Results  Component Value Date   HDL 52.20 04/20/2022   HDL 52.40 04/10/2021   HDL 50 04/09/2020   Lab Results  Component Value Date   LDLCALC 72 04/20/2022   LDLCALC 61 04/10/2021   LDLCALC 70 04/09/2020   Lab Results  Component Value Date   TRIG 115.0 04/20/2022   TRIG 98.0 04/10/2021   TRIG 149 04/09/2020   Lab Results  Component Value Date   CHOLHDL 3 04/20/2022   CHOLHDL 3 04/10/2021   CHOLHDL 2.9 04/09/2020   No results found for: "LDLDIRECT" Lab Results  Component Value Date   CREATININE 0.97 04/20/2022  BUN 10 04/20/2022   NA 142 04/20/2022   K 4.4 04/20/2022   CL 107 04/20/2022   CO2 28 04/20/2022    The 10-year ASCVD risk score (Arnett DK, et al., 2019) is: 22.2%   Values used to calculate the score:     Age: 44 years     Sex: Female     Is Non-Hispanic African American: No     Diabetic: No     Tobacco smoker: Yes     Systolic Blood Pressure: 122 mmHg     Is BP treated: No     HDL Cholesterol: 52.2 mg/dL     Total Cholesterol: 147 mg/dL  I reviewed the patients updated PMH, FH, and SocHx.    Patient  Active Problem List   Diagnosis Date Noted   Idiopathic small fiber sensory neuropathy 07/18/2020    Priority: High   White coat syndrome without diagnosis of hypertension 04/09/2020    Priority: High   Neuropathic pain 09/12/2019    Priority: High   Colon polyp 11/18/2017    Priority: High   Hypothyroidism (acquired) 12/15/2016    Priority: High   Mixed hyperlipidemia 12/15/2016    Priority: High   Osteopenia after menopause 03/22/2019    Priority: Medium    GERD (gastroesophageal reflux disease) 03/16/2019    Priority: Medium    Hiatal hernia 03/16/2019    Priority: Medium    Vitamin B12 deficiency 10/20/2018    Priority: Low   History of hyperparathyroidism 01/08/2017    Priority: Low    Allergies: Prevacid [lansoprazole], Doxycycline, Duloxetine hcl, Pregabalin, Ciprofloxacin, and Meloxicam  Social History: Patient  reports that she has quit smoking. She has never used smokeless tobacco. She reports that she does not drink alcohol and does not use drugs.  Current Meds  Medication Sig   Cholecalciferol (VITAMIN D) 50 MCG (2000 UT) tablet Take 2,000 Units by mouth daily.   cyanocobalamin (VITAMIN B12) 1000 MCG/ML injection ADMINISTER 1 ML(1000 MCG) IN THE MUSCLE EVERY 30 DAYS   esomeprazole (NEXIUM) 20 MG packet Take 20 mg by mouth as needed.   gabapentin (NEURONTIN) 300 MG capsule TAKE 1 CAPSULE(300 MG) BY MOUTH THREE TIMES DAILY   rosuvastatin (CRESTOR) 10 MG tablet TAKE 1 TABLET(10 MG) BY MOUTH DAILY   SYNTHROID 88 MCG tablet TAKE 1 TABLET(88 MCG) BY MOUTH DAILY BEFORE BREAKFAST   SYRINGE-NEEDLE, DISP, 3 ML (BD INTEGRA SYRINGE) 25G X 1" 3 ML MISC USE ONCE MONTHLY   traMADol (ULTRAM) 50 MG tablet TAKE 1 TABLET(50 MG) BY MOUTH TWICE DAILY AS NEEDED    Review of Systems: Cardiovascular: negative for chest pain, palpitations, leg swelling, orthopnea Respiratory: negative for SOB, wheezing or persistent cough Gastrointestinal: negative for abdominal pain Genitourinary:  negative for dysuria or gross hematuria  Objective  Vitals: BP 122/70 Comment: By consistent home readings  Pulse 72   Temp 98.8 F (37.1 C)   Ht 5\' 5"  (1.651 m)   Wt 175 lb 3.2 oz (79.5 kg)   SpO2 96%   BMI 29.15 kg/m  General: no acute distress  Psych:  Alert and oriented, normal mood and affect HEENT:  Normocephalic, atraumatic, supple neck  Cardiovascular:  RRR without murmur. no edema Respiratory:  Good breath sounds bilaterally, CTAB with normal respiratory effort Extremities: No edema, normal pulses, no sores Commons side effects, risks, benefits, and alternatives for medications and treatment plan prescribed today were discussed, and the patient expressed understanding of the given instructions. Patient is instructed to call or  message via MyChart if he/she has any questions or concerns regarding our treatment plan. No barriers to understanding were identified. We discussed Red Flag symptoms and signs in detail. Patient expressed understanding regarding what to do in case of urgent or emergency type symptoms.  Medication list was reconciled, printed and provided to the patient in AVS. Patient instructions and summary information was reviewed with the patient as documented in the AVS. This note was prepared with assistance of Dragon voice recognition software. Occasional wrong-word or sound-a-like substitutions may have occurred due to the inherent limitation

## 2022-11-20 ENCOUNTER — Other Ambulatory Visit: Payer: Self-pay | Admitting: Family Medicine

## 2022-11-20 DIAGNOSIS — E538 Deficiency of other specified B group vitamins: Secondary | ICD-10-CM

## 2023-01-04 ENCOUNTER — Other Ambulatory Visit: Payer: Self-pay | Admitting: Family Medicine

## 2023-04-21 ENCOUNTER — Encounter: Payer: Self-pay | Admitting: Family Medicine

## 2023-04-21 ENCOUNTER — Ambulatory Visit: Payer: Medicare Other | Admitting: Family Medicine

## 2023-04-21 VITALS — BP 126/72 | HR 63 | Temp 98.0°F | Ht 65.0 in | Wt 178.4 lb

## 2023-04-21 DIAGNOSIS — R030 Elevated blood-pressure reading, without diagnosis of hypertension: Secondary | ICD-10-CM

## 2023-04-21 DIAGNOSIS — G608 Other hereditary and idiopathic neuropathies: Secondary | ICD-10-CM | POA: Diagnosis not present

## 2023-04-21 DIAGNOSIS — E039 Hypothyroidism, unspecified: Secondary | ICD-10-CM | POA: Diagnosis not present

## 2023-04-21 DIAGNOSIS — E538 Deficiency of other specified B group vitamins: Secondary | ICD-10-CM

## 2023-04-21 DIAGNOSIS — Z8639 Personal history of other endocrine, nutritional and metabolic disease: Secondary | ICD-10-CM | POA: Diagnosis not present

## 2023-04-21 DIAGNOSIS — E782 Mixed hyperlipidemia: Secondary | ICD-10-CM

## 2023-04-21 DIAGNOSIS — Z780 Asymptomatic menopausal state: Secondary | ICD-10-CM | POA: Diagnosis not present

## 2023-04-21 DIAGNOSIS — D126 Benign neoplasm of colon, unspecified: Secondary | ICD-10-CM | POA: Diagnosis not present

## 2023-04-21 DIAGNOSIS — R7301 Impaired fasting glucose: Secondary | ICD-10-CM | POA: Diagnosis not present

## 2023-04-21 DIAGNOSIS — M8580 Other specified disorders of bone density and structure, unspecified site: Secondary | ICD-10-CM

## 2023-04-21 DIAGNOSIS — Z1231 Encounter for screening mammogram for malignant neoplasm of breast: Secondary | ICD-10-CM

## 2023-04-21 LAB — CBC WITH DIFFERENTIAL/PLATELET
Basophils Absolute: 0.1 10*3/uL (ref 0.0–0.1)
Basophils Relative: 1.2 % (ref 0.0–3.0)
Eosinophils Absolute: 0.1 10*3/uL (ref 0.0–0.7)
Eosinophils Relative: 2.8 % (ref 0.0–5.0)
HCT: 41 % (ref 36.0–46.0)
Hemoglobin: 13.8 g/dL (ref 12.0–15.0)
Lymphocytes Relative: 32 % (ref 12.0–46.0)
Lymphs Abs: 1.5 10*3/uL (ref 0.7–4.0)
MCHC: 33.7 g/dL (ref 30.0–36.0)
MCV: 93.3 fL (ref 78.0–100.0)
Monocytes Absolute: 0.4 10*3/uL (ref 0.1–1.0)
Monocytes Relative: 9.4 % (ref 3.0–12.0)
Neutro Abs: 2.5 10*3/uL (ref 1.4–7.7)
Neutrophils Relative %: 54.6 % (ref 43.0–77.0)
Platelets: 239 10*3/uL (ref 150.0–400.0)
RBC: 4.4 Mil/uL (ref 3.87–5.11)
RDW: 13.3 % (ref 11.5–15.5)
WBC: 4.5 10*3/uL (ref 4.0–10.5)

## 2023-04-21 LAB — HEMOGLOBIN A1C: Hgb A1c MFr Bld: 6.2 % (ref 4.6–6.5)

## 2023-04-21 LAB — LIPID PANEL
Cholesterol: 123 mg/dL (ref 0–200)
HDL: 49.9 mg/dL (ref >39.00)
LDL Cholesterol: 56 mg/dL (ref 0–99)
NonHDL: 72.78
Total CHOL/HDL Ratio: 2
Triglycerides: 85 mg/dL (ref 0.0–149.0)
VLDL: 17 mg/dL (ref 0.0–40.0)

## 2023-04-21 LAB — COMPREHENSIVE METABOLIC PANEL
ALT: 14 U/L (ref 0–35)
AST: 17 U/L (ref 0–37)
Albumin: 4.2 g/dL (ref 3.5–5.2)
Alkaline Phosphatase: 52 U/L (ref 39–117)
BUN: 10 mg/dL (ref 6–23)
CO2: 28 meq/L (ref 19–32)
Calcium: 9.4 mg/dL (ref 8.4–10.5)
Chloride: 108 meq/L (ref 96–112)
Creatinine, Ser: 0.85 mg/dL (ref 0.40–1.20)
GFR: 66.32 mL/min (ref >60.00)
Glucose, Bld: 95 mg/dL (ref 70–99)
Potassium: 4.3 meq/L (ref 3.5–5.1)
Sodium: 143 meq/L (ref 135–145)
Total Bilirubin: 1 mg/dL (ref 0.2–1.2)
Total Protein: 6.5 g/dL (ref 6.0–8.3)

## 2023-04-21 LAB — TSH: TSH: 0.33 u[IU]/mL — ABNORMAL LOW (ref 0.35–5.50)

## 2023-04-21 LAB — VITAMIN B12: Vitamin B-12: 370 pg/mL (ref 211–911)

## 2023-04-21 MED ORDER — SYNTHROID 88 MCG PO TABS: 88.0000 ug | ORAL_TABLET | Freq: Every day | ORAL | 3 refills | Status: DC

## 2023-04-21 NOTE — Patient Instructions (Addendum)
Please return in 6 months  for hypertension recheck   I will release your lab results to you on your MyChart account with further instructions. You may see the results before I do, but when I review them I will send you a message with my report or have my assistant call you if things need to be discussed. Please reply to my message with any questions. Thank you!   If you have any questions or concerns, please don't hesitate to send me a message via MyChart or call the office at [phone]. Thank you for visiting with [place] today! It's our pleasure caring for you.   Please call the office checked below to schedule your appointment for your mammogram and/or bone density screen (the checked studies were ordered): [x]   Mammogram  [x]   Bone Density  [x]   [facility], [phone]  VISIT SUMMARY:  During your routine six-month check-up, we reviewed your blood pressure, thyroid condition, neuropathy, and arthritis. You reported that your blood pressure readings at home are generally within an acceptable range, and you are not experiencing any issues with your current medications. We also discussed your back pain and hip throbbing, which you attribute to arthritis, and your neuropathy symptoms. Additionally, we reviewed your upcoming routine screenings.  YOUR PLAN:  -HYPERTENSION: Hypertension, or high blood pressure, is being well-managed with your current medications. Your home readings are within an acceptable range. Please continue to monitor your blood pressure at home once a week and continue taking your current medications.  -HYPOTHYROIDISM: Hypothyroidism is a condition where your thyroid does not produce enough hormones. Your condition is well-managed with your current dose of Synthroid. We will order blood work to confirm your thyroid levels and refill your Synthroid prescription. Please coordinate your prescription refill with your new insurance plan in  January.  -PERIPHERAL NEUROPATHY: Peripheral neuropathy is a condition that causes numbness and pain in your extremities. Your symptoms are being managed with gabapentin and supportive footwear. Please continue taking gabapentin and using the supportive footwear.  -ARTHRITIS: Arthritis is causing chronic back pain and hip throbbing, especially at night. You are currently managing the pain with Tylenol, a heating pad, and stretching. If your symptoms persist into the new year, we may refer you to a back specialist for a possible injection.  -GENERAL HEALTH MAINTENANCE: Your routine screenings and vaccinations are up to date. You are due for a mammogram and bone density scan in January, and a colonoscopy in 2025. We will provide you with the contact information for scheduling these tests.  INSTRUCTIONS:  Please schedule your mammogram and bone density scan for January. We will send you to the lab for blood work. Your follow-up appointment is in six months.

## 2023-04-21 NOTE — Progress Notes (Signed)
Subjective  Chief Complaint  Patient presents with   Annual Exam    Pt here for Annual Exam and is currently fasting    Hypothyroidism   Hyperlipidemia   B12 deficiency    HPI: Latasha Mclaughlin is a 77 y.o. female who presents to [facility] [place] today for a Female Wellness Visit. She also has the concerns and/or needs as listed above in the chief complaint. These will be addressed in addition to the Health Maintenance Visit.   Wellness Visit: annual visit with health maintenance review and exam  HM: due mammo and dexa. Imms up to date. Doing well overall. Chronic disease f/u and/or acute problem visit: (deemed necessary to be done in addition to the wellness visit): Discussed the use of AI scribe software for clinical note transcription with the patient, who gave verbal consent to proceed.  History of Present Illness   The patient, with a history of white coat syndrome, thyroid disease, and neuropathy, presents for a routine six-month check-up. She reports that her blood pressure readings at home are generally around 130/64 or 126/74, which she monitors occasionally. She denies any issues with her current medications.  The patient also reports experiencing back pain and throbbing in her hip, particularly at night, which she attributes to arthritis. She has been managing the pain with a heating pad and cold compresses, and is considering seeing her back doctor for a possible injection if the pain does not improve.  The patient's neuropathy has been causing numbness in her feet, which has affected her mobility. She has found some relief from wearing a specific type of shoe, despite its unattractive appearance. She also reports that she has not been experiencing night sweats as frequently as she was six months ago. Peripheral neuropathy and neuropathic trunk pain managed fairly well with gabapentin.  The patient is due for a mammogram and bone density test in January,  which she plans to schedule at her local hospital. She is also due for a colonoscopy in 2025.       Assessment  1. Hypothyroidism (acquired)   2. White coat syndrome without diagnosis of hypertension   3. Idiopathic small fiber sensory neuropathy   4. Mixed hyperlipidemia   5. Osteopenia after menopause   6. Vitamin B12 deficiency   7. History of hyperparathyroidism   8. IFG (impaired fasting glucose)   9. Adenomatous polyp of colon, unspecified part of colon   10. Screening mammogram for breast cancer   11. Asymptomatic menopausal state      Plan  Female Wellness Visit: Age appropriate Health Maintenance and Prevention measures were discussed with patient. Included topics are cancer screening recommendations, ways to keep healthy (see AVS) including dietary and exercise recommendations, regular eye and dental care, use of seat belts, and avoidance of moderate alcohol use and tobacco use.  BMI: discussed patient's BMI and encouraged positive lifestyle modifications to help get to or maintain a target BMI. HM needs and immunizations were addressed and ordered. See below for orders. See HM and immunization section for updates. Routine labs and screening tests ordered including cmp, cbc and lipids where appropriate. Discussed recommendations regarding Vit D and calcium supplementation (see AVS)  Chronic disease management visit and/or acute problem visit: Assessment and Plan    Elevated bp w/ white coat syndrome Blood pressure readings at home show good control. Not on bp meds - Monitor blood pressure at home once a week  Hypothyroidism Well-managed on current dose of  Synthroid. Blood work to confirm levels. Plans to change Plan D prescription in January due to cost increase. - Order thyroid function tests - Refill Synthroid (levothyroxine) - Coordinate prescription refill with new insurance plan in January  Peripheral Neuropathy Numbness in feet, managed with gabapentin. Reports  improvement with new supportive footwear despite initial reluctance due to appearance. - Continue gabapentin - Encourage use of supportive footwear  Arthritis Chronic back pain with hip throbbing at night, likely due to arthritis. Uses Tylenol, heating pad, and stretching for relief. Considering referral to back specialist for possible injection if symptoms persist into the new year. - Continue current pain management strategies - Consider referral to back specialist if symptoms persist into the new year  General Health Maintenance Routine screenings and vaccinations are up to date. Mammogram and bone density scan due in January. Colonoscopy due in 2025. - Order mammogram and bone density scan - Provide contact information for scheduling - Schedule follow-up in six months  Follow-up - Follow-up appointment in six months - Send to lab for blood work - Include contact information for scheduling mammogram and bone density scan in after visit summary.      Orders Placed This Encounter  Procedures   MM DIGITAL SCREENING BILATERAL   DG Bone Density   TSH   CBC with Differential/Platelet   Comprehensive metabolic panel   Lipid panel   Vitamin B12   Hemoglobin A1c   Meds ordered this encounter  Medications   SYNTHROID 88 MCG tablet    Sig: Take 1 tablet (88 mcg total) by mouth daily before breakfast.    Dispense:  90 tablet    Refill:  3      Body mass index is 29.69 kg/m. Wt Readings from Last 3 Encounters:  04/21/23 178 lb 6.4 oz (80.9 kg)  10/20/22 175 lb 3.2 oz (79.5 kg)  08/03/22 180 lb (81.6 kg)     Patient Active Problem List   Diagnosis Date Noted Date Diagnosed   Adenomatous colon polyp 04/21/2023     Priority: High    Colonoscopy 2015, 2020, repeat q 5 years    Idiopathic small fiber sensory neuropathy 07/18/2020     Priority: High    Thorough eval with neurologist x 2; negative imaging; treated with gabapentin 300 bid (can't tolerate higher dose) and  tramadol  Flank and abdominal pain    White coat syndrome without diagnosis of hypertension 04/09/2020     Priority: High   Neuropathic pain 09/12/2019     Priority: High   Colon polyp 11/18/2017     Priority: High   Hypothyroidism (acquired) 12/15/2016     Priority: High   Mixed hyperlipidemia 12/15/2016     Priority: High   Osteopenia after menopause 03/22/2019     Priority: Medium     dexa 03/2019: T=-2.1 hip; Lspine excluded.  Dexa 04/2021: lowest T - -2.0 femur; Lspine exluded. No statistical change, osteeopenia. Continue same. Recheck 2 years.     GERD (gastroesophageal reflux disease) 03/16/2019     Priority: Medium    Hiatal hernia 03/16/2019     Priority: Medium    Vitamin B12 deficiency 10/20/2018     Priority: Low   History of hyperparathyroidism 01/08/2017     Priority: Low   IFG (impaired fasting glucose) 04/21/2023    Health Maintenance  Topic Date Due   INFLUENZA VACCINE  08/01/2023 (Originally 12/02/2022)   DEXA SCAN  05/01/2023   MAMMOGRAM  05/27/2023   Medicare Annual Wellness (AWV)  08/03/2023   Colonoscopy  10/06/2023   Pneumonia Vaccine 53+ Years old  Completed   Hepatitis C Screening  Completed   HPV VACCINES  Aged Out   DTaP/Tdap/Td  Discontinued   COVID-19 Vaccine  Discontinued   Zoster Vaccines- Shingrix  Discontinued   Immunization History  Administered Date(s) Administered   Fluad Quad(high Dose 65+) 03/16/2019, 04/09/2020   Influenza, High Dose Seasonal PF 03/19/2015, 01/21/2016, 01/19/2017, 01/27/2018   Influenza-Unspecified 02/10/2006, 01/26/2008, 01/02/2012, 03/19/2015, 01/21/2016, 01/27/2018, 02/14/2021   Pneumococcal Conjugate-13 01/21/2016   Pneumococcal Polysaccharide-23 04/10/2021   We updated and reviewed the patient's past history in detail and it is documented below. Allergies: Patient is allergic to prevacid [lansoprazole], doxycycline, duloxetine hcl, pregabalin, ciprofloxacin, and meloxicam. Past Medical History Patient   has a past medical history of Allergy, Arthritis, Back pain, GERD (gastroesophageal reflux disease), Hiatal hernia (03/16/2019), History of tonsillectomy, Hyperlipidemia (12/15/2016), Hypothyroidism (12/15/2016), Osteopenia after menopause (03/22/2019), Osteoporosis, and Sinus infection. Past Surgical History Patient  has a past surgical history that includes Anterior cervical decomp/discectomy fusion (2002); Left oophorectomy; Tonsillectomy; Parathyroidectomy; Adenoidectomy; Dilation and curettage of uterus; Shoulder surgery; Colonoscopy (10/06/2018); and Polypectomy. Family History: Patient family history includes Cancer in her father; Colon polyps in her father and sister; Congestive Heart Failure in her mother; Diabetes in her father and son; Drug abuse in her brother; Heart disease in her father and mother; Hyperlipidemia in her father and mother; Hypertension in her mother; Thyroid disease in her sister. Social History:  Patient  reports that she has quit smoking. She has never used smokeless tobacco. She reports that she does not drink alcohol and does not use drugs.  Review of Systems: Constitutional: negative for fever or malaise Ophthalmic: negative for photophobia, double vision or loss of vision Cardiovascular: negative for chest pain, dyspnea on exertion, or new LE swelling Respiratory: negative for SOB or persistent cough Gastrointestinal: negative for abdominal pain, change in bowel habits or melena Genitourinary: negative for dysuria or gross hematuria, no abnormal uterine bleeding or disharge Musculoskeletal: negative for new gait disturbance or muscular weakness Integumentary: negative for new or persistent rashes, no breast lumps Neurological: negative for TIA or stroke symptoms Psychiatric: negative for SI or delusions Allergic/Immunologic: negative for hives  Patient Care Team    Relationship Specialty Notifications Start End  Willow Ora, MD PCP - General Family  Medicine  03/16/19   Glendale Chard, DO Consulting Physician Neurology  11/21/18   Meryl Dare, MD Consulting Physician Gastroenterology  03/16/19   Lavada Mesi, MD Consulting Physician Sports Medicine  03/16/19   Ferman Hamming, DPM Consulting Physician Podiatry  03/16/19   Tyrell Antonio, MD Consulting Physician Physical Medicine and Rehabilitation  04/09/20   Sharmaine Base, MD Referring Physician Neurology  04/09/20   Meryl Dare, MD Consulting Physician Gastroenterology  04/21/23     Objective  Vitals: BP 126/72 Comment: by home readings  Pulse 63   Temp 98 F (36.7 C)   Ht 5\' 5"  (1.651 m)   Wt 178 lb 6.4 oz (80.9 kg)   SpO2 98%   BMI 29.69 kg/m  General:  Well developed, well nourished, no acute distress  Psych:  Alert and orientedx3,normal mood and affect HEENT:  Normocephalic, atraumatic, non-icteric sclera,  supple neck without adenopathy, mass or thyromegaly Cardiovascular:  Normal S1, S2, RRR without gallop, rub or murmur Respiratory:  Good breath sounds bilaterally, CTAB with normal respiratory effort Gastrointestinal: normal bowel sounds, soft, non-tender, no noted masses. No HSM MSK: extremities without  edema, joints without erythema or swelling Neurologic:    Mental status is normal.   No tremor  Commons side effects, risks, benefits, and alternatives for medications and treatment plan prescribed today were discussed, and the patient expressed understanding of the given instructions. Patient is instructed to call or message via MyChart if he/she has any questions or concerns regarding our treatment plan. No barriers to understanding were identified. We discussed Red Flag symptoms and signs in detail. Patient expressed understanding regarding what to do in case of urgent or emergency type symptoms.  Medication list was reconciled, printed and provided to the patient in AVS. Patient instructions and summary information was reviewed with the patient as  documented in the AVS. This note was prepared with assistance of Dragon voice recognition software. Occasional wrong-word or sound-a-like substitutions may have occurred due to the inherent limitations of voice recognition software

## 2023-04-22 ENCOUNTER — Encounter: Payer: Self-pay | Admitting: Family Medicine

## 2023-04-22 NOTE — Progress Notes (Signed)
See mychart note Dear Ms. Baptista, Your lab results look good.  Your thyroid is showing borderline high but it is still in range of acceptable so continue your synthroid daily and I will recheck in 6 months.  Your B12 is getting low again so please continue getting the monthly injections. And your sugar test is in the prediabetic range: limit sweets and processed carbohydrates to keep this from progressing.  Everything else looks great!  Sincerely, Dr. Mardelle Matte

## 2023-05-03 ENCOUNTER — Encounter: Payer: Self-pay | Admitting: Physical Medicine and Rehabilitation

## 2023-05-03 ENCOUNTER — Ambulatory Visit: Payer: Medicare Other | Admitting: Physical Medicine and Rehabilitation

## 2023-05-03 VITALS — BP 147/89 | HR 70

## 2023-05-03 DIAGNOSIS — M545 Low back pain, unspecified: Secondary | ICD-10-CM | POA: Diagnosis not present

## 2023-05-03 DIAGNOSIS — G8929 Other chronic pain: Secondary | ICD-10-CM | POA: Diagnosis not present

## 2023-05-03 DIAGNOSIS — M7918 Myalgia, other site: Secondary | ICD-10-CM

## 2023-05-03 MED ORDER — BACLOFEN 10 MG PO TABS: 10.0000 mg | ORAL_TABLET | Freq: Two times a day (BID) | ORAL | 0 refills | Status: DC

## 2023-05-03 NOTE — Progress Notes (Signed)
 Functional Pain Scale - descriptive words and definitions  Moderate (4)   Constantly aware of pain, can complete ADLs with modification/sleep marginally affected at times/passive distraction is of no use, but active distraction gives some relief. Moderate range order  Average Pain 4  LBP and right hip pain. Had injection 3 years ago. It helped her a lot. She has started having the same pain again in her lower back and right hip. Its worse in the am and can be a 10 on pain scale.

## 2023-05-03 NOTE — Progress Notes (Signed)
 Latasha Mclaughlin - 77 y.o. female MRN [id number]  Date of birth: 01-Feb-1946  Office Visit Note: Visit Date: 05/03/2023 PCP: Jodie Lavern CROME, MD Referred by: Jodie Lavern CROME, MD  Subjective: Chief Complaint  Patient presents with   Lower Back - Pain   Right Hip - Pain   HPI: Latasha Mclaughlin is a 77 y.o. female who comes in today as a self referral for evaluation of chronic, worsening and severe bilateral lower back pain. She was last seen in our office in 2021. States her pain is localized to tender knots to both sided of lower back. Pain ongoing for several years, worsens with movement and activity. She describes pain as sore and aching sensation, currently rates as 7 out of 10. Some relief of pain with home exercise regimen, heating pad, ice and use of medications. I did review Dr. Lyda office notes from 2021, he felt these areas were most consistent with myofascial trigger points/lipomas. He did perform trigger point injections bilaterally that provided significant pain relief for 3 years. Lumbar MRI imaging from 2020 does not show any severe nerve compression or spinal canal stenosis. There is shallow disc bulge at L4-L5 without stenosis. She reports complex history of chronic bilateral leg paresthesias. She was evaluated by Dr. Idamae Bouquet with [facility] in 2022 for ongoing paresthesias, prior bilateral lower extremity NCV with EMG from 2020 shows chronic L4 radiculopathy, no evidence of large fiber polyneuropathy. Patient continues on Gabapentin  300 mg TID, she has not followed up with neurology recently. Patient denies focal weakness. No recent trauma or falls.      Review of Systems  Musculoskeletal:  Positive for back pain and myalgias.  Neurological:  Positive for tingling. Negative for weakness.  All other systems reviewed and are negative.  Otherwise per HPI.  Assessment & Plan: Visit Diagnoses:    ICD-10-CM   1. Chronic bilateral low back pain without  sciatica  M54.50    G89.29     2. Myofascial pain syndrome  M79.18        Plan: Findings:  Chronic, worsening and severe bilateral lower back pain. No pain radiating down the legs. Patient continues to have severe pain despite good conservative therapies such as home exercise regimen, heating pad, ice and use of medications. Patients clinical presentation and exam are consistent with myofascial pain syndrome. Palpable trigger points noted to bilateral quadratus lumborum region upon palpation today. Prior myofascial trigger point injections provided her with substantial pain relief for several years. We discussed treatment plan in detail today, I did perform myofascial trigger point injections to bilateral quadratus lumborum region in the office today, she tolerated without difficulty. Should her pain present as more radicular in nature would be quick to obtain new lumbar MRI imaging. She can follow up with neurology concerning chronic lower extremity paresthesias. Patient has no questions at this time. No red flag symptoms noted upon exam today.     Meds & Orders:  Meds ordered this encounter  Medications   baclofen  (LIORESAL ) 10 MG tablet    Sig: Take 1 tablet (10 mg total) by mouth 2 (two) times daily.    Dispense:  30 each    Refill:  0    Orders Placed This Encounter  Procedures   Trigger Point Inj    Follow-up: Return if symptoms worsen or fail to improve.   Procedures: Trigger Point Inj  Date/Time: 05/03/2023 2:52 PM  Performed by: Yarethzy Croak E, NP Authorized by: Trudy,  Crystie Yanko E, NP   Consent Given by:  Patient Indications:  Pain Total # of Trigger Points:  2 Location: back   Needle Size:  25 G Approach:  Dorsal Medications #1:  1 mL lidocaine  1 %; 40 mg triamcinolone  acetonide 40 MG/ML Patient tolerance:  Patient tolerated the procedure well with no immediate complications Comments: Myofascial trigger point injections to bilateral quadratus lumborum regions,  needling technique utilized.        Clinical History: No specialty comments available.   She reports that she has quit smoking. She has never used smokeless tobacco.  Recent Labs    10/20/22 1017 04/21/23 1046  HGBA1C 5.9 6.2    Objective:  VS:  HT:    WT:   BMI:     BP:(!) 147/89  HR:70bpm  TEMP: ( )  RESP:  Physical Exam Vitals and nursing note reviewed.  HENT:     Head: Normocephalic and atraumatic.     Right Ear: External ear normal.     Left Ear: External ear normal.     Nose: Nose normal.     Mouth/Throat:     Mouth: Mucous membranes are moist.  Eyes:     Extraocular Movements: Extraocular movements intact.  Cardiovascular:     Rate and Rhythm: Normal rate.     Pulses: Normal pulses.  Pulmonary:     Effort: Pulmonary effort is normal.  Abdominal:     General: Abdomen is flat. There is no distension.  Musculoskeletal:        General: Tenderness present.     Cervical back: Normal range of motion.     Comments: Patient rises from seated position to standing without difficulty. Good lumbar range of motion. No pain noted with facet loading. 5/5 strength noted with bilateral hip flexion, knee flexion/extension, ankle dorsiflexion/plantarflexion and EHL. No clonus noted bilaterally. No pain upon palpation of greater trochanters. No pain with internal/external rotation of bilateral hips. Sensation intact bilaterally. Tenderness and palpable trigger points noted to bilateral quadratus lumborum regions. Negative slump test bilaterally. Ambulates without aid, gait steady.     Skin:    General: Skin is warm and dry.     Capillary Refill: Capillary refill takes less than 2 seconds.  Neurological:     General: No focal deficit present.     Mental Status: She is alert and oriented to person, place, and time.  Psychiatric:        Mood and Affect: Mood normal.        Behavior: Behavior normal.     Ortho Exam  Imaging: No results found.  Past  Medical/Family/Surgical/Social History: Medications & Allergies reviewed per EMR, new medications updated. Patient Active Problem List   Diagnosis Date Noted   IFG (impaired fasting glucose) 04/21/2023   Adenomatous colon polyp 04/21/2023   Idiopathic small fiber sensory neuropathy 07/18/2020   White coat syndrome without diagnosis of hypertension 04/09/2020   Neuropathic pain 09/12/2019   Osteopenia after menopause 03/22/2019   GERD (gastroesophageal reflux disease) 03/16/2019   Hiatal hernia 03/16/2019   Vitamin B12 deficiency 10/20/2018   Colon polyp 11/18/2017   History of hyperparathyroidism 01/08/2017   Hypothyroidism (acquired) 12/15/2016   Mixed hyperlipidemia 12/15/2016   Past Medical History:  Diagnosis Date   Allergy    SEASONAL   Arthritis    Back pain    GERD (gastroesophageal reflux disease)    Hiatal hernia 03/16/2019   History of tonsillectomy    Hyperlipidemia 12/15/2016   Hypothyroidism 12/15/2016  Osteopenia after menopause 03/22/2019   dexa 03/2019: T=-2.1 hip; Lspine excluded.    Osteoporosis    Sinus infection    Family History  Problem Relation Age of Onset   Hyperlipidemia Mother    Hypertension Mother    Congestive Heart Failure Mother    Heart disease Mother    Cancer Father    Diabetes Father    Heart disease Father    Hyperlipidemia Father    Colon polyps Father    Thyroid  disease Sister    Colon polyps Sister    Drug abuse Brother    Diabetes Son    Colon cancer Neg Hx    Esophageal cancer Neg Hx    Stomach cancer Neg Hx    Rectal cancer Neg Hx    Past Surgical History:  Procedure Laterality Date   ADENOIDECTOMY     ANTERIOR CERVICAL DECOMP/DISCECTOMY FUSION  2002   COLONOSCOPY  10/06/2018   DILATION AND CURETTAGE OF UTERUS     X2   LEFT OOPHORECTOMY     PARATHYROIDECTOMY     POLYPECTOMY     SHOULDER SURGERY     TONSILLECTOMY     Social History   Occupational History   Occupation: retired  Tobacco Use   Smoking  status: Former   Smokeless tobacco: Never   Tobacco comments:    Quit in 1985  Vaping Use   Vaping status: Never Used  Substance and Sexual Activity   Alcohol use: No   Drug use: No   Sexual activity: Yes

## 2023-05-05 MED ORDER — LIDOCAINE HCL 1 % IJ SOLN
1.0000 mL | INTRAMUSCULAR | Status: AC | PRN
  Administered 2023-05-03: 1 mL

## 2023-05-05 MED ORDER — TRIAMCINOLONE ACETONIDE 40 MG/ML IJ SUSP
40.0000 mg | INTRAMUSCULAR | Status: AC | PRN
  Administered 2023-05-03: 40 mg via INTRAMUSCULAR

## 2023-05-11 ENCOUNTER — Other Ambulatory Visit: Payer: Self-pay | Admitting: Physical Medicine and Rehabilitation

## 2023-05-11 DIAGNOSIS — M545 Low back pain, unspecified: Secondary | ICD-10-CM

## 2023-05-12 ENCOUNTER — Other Ambulatory Visit: Payer: Self-pay | Admitting: Physical Medicine and Rehabilitation

## 2023-05-12 MED ORDER — DIAZEPAM 5 MG PO TABS: ORAL_TABLET | ORAL | 0 refills | Status: DC

## 2023-05-13 ENCOUNTER — Other Ambulatory Visit: Payer: Medicare Other

## 2023-05-16 ENCOUNTER — Ambulatory Visit
Admission: RE | Admit: 2023-05-16 | Discharge: 2023-05-16 | Disposition: A | Discharge status: Home / Self Care | Payer: Medicare Other | Source: Ambulatory Visit | Attending: Physical Medicine and Rehabilitation | Admitting: Physical Medicine and Rehabilitation

## 2023-05-16 DIAGNOSIS — M47816 Spondylosis without myelopathy or radiculopathy, lumbar region: Secondary | ICD-10-CM | POA: Diagnosis not present

## 2023-05-16 DIAGNOSIS — M5124 Other intervertebral disc displacement, thoracic region: Secondary | ICD-10-CM | POA: Diagnosis not present

## 2023-05-16 DIAGNOSIS — M545 Low back pain, unspecified: Secondary | ICD-10-CM

## 2023-05-17 ENCOUNTER — Other Ambulatory Visit: Payer: Self-pay

## 2023-05-17 ENCOUNTER — Encounter: Payer: Self-pay | Admitting: Family Medicine

## 2023-05-17 DIAGNOSIS — E538 Deficiency of other specified B group vitamins: Secondary | ICD-10-CM

## 2023-05-17 MED ORDER — CYANOCOBALAMIN 1000 MCG/ML IJ SOLN: INTRAMUSCULAR | 3 refills | Status: DC

## 2023-05-30 ENCOUNTER — Encounter: Payer: Self-pay | Admitting: Physical Medicine and Rehabilitation

## 2023-05-30 ENCOUNTER — Ambulatory Visit (INDEPENDENT_AMBULATORY_CARE_PROVIDER_SITE_OTHER): Payer: Medicare Other | Admitting: Physical Medicine and Rehabilitation

## 2023-05-30 DIAGNOSIS — M545 Low back pain, unspecified: Secondary | ICD-10-CM | POA: Diagnosis not present

## 2023-05-30 DIAGNOSIS — M7918 Myalgia, other site: Secondary | ICD-10-CM

## 2023-05-30 DIAGNOSIS — G8929 Other chronic pain: Secondary | ICD-10-CM

## 2023-05-30 NOTE — Progress Notes (Unsigned)
Latasha Mclaughlin - 78 y.o. female MRN [id number]  Date of birth: 14-Aug-1945  Office Visit Note: Visit Date: 05/30/2023 PCP: Willow Ora, MD Referred by: Willow Ora, MD  Subjective: Chief Complaint  Patient presents with   Lower Back - Pain   HPI: Latasha Mclaughlin is a 78 y.o. female who comes in today for evaluation of chronic bilateral lower back pain. Pain ongoing for several years, worsens with movement and activity. She describes pain as tender, tight and sore regions to bilateral lower back. Currently rates pain as 3 out of 10. Some relief of pain with home exercise regimen, rest and use of medications. Dr. Alvester Morin and myself have performed myofascial trigger point injections multiple times with good relief of pain. Most recent myofascial trigger point injections performed on 05/02/2024, provided greater than 60% relief of pain and continues to sustain. Recent lumbar MRI imaging exhibits shallow central disc protrusion at L4-L5 causing mild triangulation of thecal sac, stable moderate facet arthropathy at L5-S1. No severe central canal stenosis or nerve impingement noted. Overall, patient reports she is doing much better following myofascial trigger point injections, she reports increased functional ability and states she is able to sleep at night. Patient denies focal weakness. No recent trauma or falls.      Review of Systems  Musculoskeletal:  Positive for back pain and myalgias.  Neurological:  Positive for tingling. Negative for focal weakness and weakness.  All other systems reviewed and are negative.  Otherwise per HPI.  Assessment & Plan: Visit Diagnoses:    ICD-10-CM   1. Chronic bilateral low back pain without sciatica  M54.50 Ambulatory referral to Physical Therapy   G89.29     2. Myofascial pain syndrome  M79.18 Ambulatory referral to Physical Therapy       Plan: Findings:  Chronic bilateral lower back pain. No radicular symptoms radiating down the legs.  Significant and sustained pain relief with recent myofascial trigger point injections performed to bilateral quadratus lumborum regions. Patients clinical presentation and exam are consistent with myofascial pain syndrome. Myofascial tenderness noted to bilateral quadratus lumborum region today. Her pain has improved significantly since our last visit in December. I discussed recent lumbar MRI with her today using imaging and spine model. Her symptoms do not directly correlate with lumbar MRI imaging. We discussed treatment plan in detail today. I placed referral for patient to attend short course of formal physical therapy. I do think she would benefit from dry needling. I also discussed repeating myofascial trigger point injections in the office infrequently. Patient has no questions at this time. I encouraged her to let us know how if her pain worsens or changes in nature. No red flag symptoms noted upon exam.     Meds & Orders: No orders of the defined types were placed in this encounter.   Orders Placed This Encounter  Procedures   Ambulatory referral to Physical Therapy    Follow-up: Return if symptoms worsen or fail to improve.   Procedures: No procedures performed      Clinical History: LINICAL DATA:  Low back pain for 2 months. No known injury or prior relevant surgery.   EXAM: MRI LUMBAR SPINE WITHOUT CONTRAST   TECHNIQUE: Multiplanar, multisequence MR imaging of the lumbar spine was performed. No intravenous contrast was administered.   COMPARISON:  Lumbar MRI 02/13/2019. Thoracic MRI 12/06/2019. Radiographs 02/03/2019.   FINDINGS: Segmentation: Conventional anatomy assumed, with the last open disc space designated L5-S1.Concordant with prior imaging.  Alignment:  Physiologic.   Vertebrae: No worrisome osseous lesion, acute fracture or pars defect. The visualized sacroiliac joints appear unremarkable.   Conus medullaris: Extends to the L1 level. The conus and  cauda equina appear normal.   Paraspinal and other soft tissues: No significant paraspinal findings.   Disc levels:   Sagittal images demonstrate a chronic central disc extrusion with cephalad extension at T11-12, unchanged from previous MRI. No significant disc space findings at T12-L1.   L1-2: Mild loss of disc height with mild disc bulging and anterior osteophytes. No spinal stenosis or nerve root encroachment.   L2-3: Preserved disc height with mild disc bulging and mild facet hypertrophy. No spinal stenosis or nerve root encroachment.   L3-4: Preserved disc height with mild disc bulging and mild facet hypertrophy. No spinal stenosis or nerve root encroachment.   L4-5: Mildly progressive loss of disc height with mild disc bulging, facet and ligamentous hypertrophy. There is a shallow central disc protrusion contributing to mild triangulation of the thecal sac and mild lateral recess narrowing bilaterally. The foramina remain sufficiently patent.   L5-S1: Disc height and hydration are maintained. Stable moderate facet hypertrophy without resulting spinal stenosis or nerve root encroachment.   IMPRESSION: 1. 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 2. Stable moderate facet hypertrophy at L5-S1 without resulting spinal stenosis or nerve root encroachment. 3. No acute findings or clear explanation for the patient's symptoms. 4. 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     Electronically Signed   By: Carey Bullocks M.D.   On: 05/25/2023 13:57   She reports that she has quit smoking. She has never used smokeless tobacco.  Recent Labs    10/20/22 1017 04/21/23 1046  HGBA1C 5.9 6.2    Objective:  VS:  HT:    WT:   BMI:     BP:   HR: bpm  TEMP: ( )  RESP:  Physical  Exam Vitals and nursing note reviewed.  HENT:     Head: Normocephalic and atraumatic.     Right Ear: External ear normal.     Left Ear: External ear normal.     Nose: Nose normal.     Mouth/Throat:     Mouth: Mucous membranes are moist.  Eyes:     Extraocular Movements: Extraocular movements intact.  Cardiovascular:     Rate and Rhythm: Normal rate.     Pulses: Normal pulses.  Pulmonary:     Effort: Pulmonary effort is normal.  Abdominal:     General: Abdomen is flat. There is no distension.  Musculoskeletal:        General: Tenderness present.     Cervical back: Normal range of motion.     Comments: Patient rises from seated position to standing without difficulty. Good lumbar range of motion. No pain noted with facet loading. 5/5 strength noted with bilateral hip flexion, knee flexion/extension, ankle dorsiflexion/plantarflexion and EHL. No clonus noted bilaterally. No pain upon palpation of greater trochanters. No pain with internal/external rotation of bilateral hips. Sensation intact bilaterally. Myofascial  tenderness and palpable trigger points noted to bilateral quadratus lumborum regions Negative slump test bilaterally. Ambulates without aid, gait steady.     Skin:    General: Skin is warm and dry.     Capillary Refill: Capillary refill takes less than 2 seconds.  Neurological:  General: No focal deficit present.     Mental Status: She is alert and oriented to person, place, and time.  Psychiatric:        Mood and Affect: Mood normal.        Behavior: Behavior normal.     Ortho Exam  Imaging: DG Bone Density Result Date: 06/02/2023 EXAM: DUAL X-RAY ABSORPTIOMETRY (DXA) FOR BONE MINERAL DENSITY IMPRESSION: Your patient Latasha Mclaughlin completed a BMD test on 06/02/2023 using the [employer] DXA System (software version: 14.10) manufactured by [employer]. The following summarizes the results of our evaluation. Technologist: [facility] PATIENT BIOGRAPHICAL: Name:  Latasha Mclaughlin, Latasha Mclaughlin Patient ID: [id number] Birth Date: 11-06-1945 Height: 65.0 in. Gender: Female Exam Date: 06/02/2023 Weight: 175.0 lbs. Indications: Caucasian, Secondary Osteoporosis, Follow up Osteopenia, Height Loss, Unilateral oophrectomy, Hyperthyroid, Low Calcium Intake, Post Menopausal Fractures: Treatments: Synthroid, Vitamin D DENSITOMETRY RESULTS: Site      Region     Measured Date Measured Age WHO Classification Young Adult T-score BMD         %Change vs. Previous Significant Change (*) AP Spine L3-L4 06/02/2023 77.0 Normal -0.6 1.134 g/cm2 2.6% - AP Spine L3-L4 04/30/2021 74.9 Normal -0.8 1.105 g/cm2 2.1% - AP Spine L3-L4 03/22/2019 72.8 Normal -1.0 1.082 g/cm2 - - DualFemur Neck Right 06/02/2023 77.0 Osteopenia -2.3 0.713 g/cm2 -3.4% - DualFemur Neck Right 04/30/2021 74.9 Osteopenia -2.2 0.738 g/cm2 -0.7% - DualFemur Neck Right 03/22/2019 72.8 Osteopenia -2.1 0.743 g/cm2 - - DualFemur Total Mean 06/02/2023 77.0 Osteopenia -1.5 0.815 g/cm2 1.0% - DualFemur Total Mean 04/30/2021 74.9 Osteopenia -1.6 0.807 g/cm2 -1.0% - DualFemur Total Mean 03/22/2019 72.8 Osteopenia -1.5 0.815 g/cm2 - - ASSESSMENT: The BMD measured at Femur Neck Right is 0.713 g/cm2 with a T-score of -2.3. This patient is considered osteopenic according to World Health Organization [facility]) criteria. The scan quality is good. Compared with the prior study on 04/30/21, the BMD of the total mean shows no statistically significant change. L1 and L2 were excluded due to advanced degenerative changes. World Science writer [facility]) criteria for post-menopausal, Caucasian Women: Normal:       T-score at or above -1 SD Osteopenia:   T-score between -1 and -2.5 SD Osteoporosis: T-score at or below -2.5 SD RECOMMENDATIONS: 1. All patients should optimize calcium and vitamin D intake. 2. Consider FDA-approved medical therapies in postmenopausal women and med aged 41 years and older, based on the following: a. A hip or vertebral (clinical or  morphometric) fracture b. T-score< -2.5 at the femoral neck or spine after appropriate evaluation to exclude secondary causes c. Low bone mass (T-score between -1.0 and -2.5 at the femoral neck or spine) and a 10-year probability of a hip fracture > 3% or a 10-year probability of a major osteoporosis-related fracture > 20% based on the [place]-adapted WHO algorithm d. Clinician judgment and/or patient preferences may indicate treatment for people with 10-year fracture probabilities above or below these levels FOLLOW-UP: Patients with diagnosis of osteoporsis or at high risk for fracture should have regular bone mineral density tests. For patients eligible for Medicare, routine testing is allowed once every 2 years. The testing frequency can be increased to one year for patients who have rapidly progressing disease, those who are receiving or discontinuing medical therapy to restore bone mass, or have additional risk factors. I have reviewed this report, and agree with the above findings. [facility] Your patient Latasha Mclaughlin completed a FRAX assessment on 06/02/2023 using the [employer] DXA System (analysis  version: 14.10) manufactured by [employer]. The following summarizes the results of our evaluation. PATIENT BIOGRAPHICAL: Name: Latasha Mclaughlin, Latasha Mclaughlin Patient ID: [id number] Birth Date: [birth year] Height:    65.0 in. Gender:     [age]    [age]       Weight:    175.0 lbs. Ethnicity:  White                            Exam Date: 06/02/2023 FRAX* RESULTS:  (version: 3.5) 10-year Probability of Fracture1 Major Osteoporotic Fracture2 Hip Fracture 16.4% 5.1% Population: [place] (Caucasian) Risk Factors: Secondary Osteoporosis Based on Femur (Right) Neck BMD 1 -The 10-year probability of fracture may be lower than reported if the patient has received treatment. 2 -Major Osteoporotic Fracture: Clinical Spine, Forearm, Hip or Shoulder *FRAX is a Armed forces logistics/support/administrative officer of the [employer] of [employer] for Metabolic Bone Disease, a World Science writer (WHO) [employer]. ASSESSMENT: The probability of a major osteoporotic fracture is 16.4% within the next ten years. The probability of a hip fracture is 5.1% within the next ten years. Electronically Signed   By: Romona Curls M.D.   On: 06/02/2023 10:18    Past Medical/Family/Surgical/Social History: Medications & Allergies reviewed per EMR, new medications updated. Patient Active Problem List   Diagnosis Date Noted   IFG (impaired fasting glucose) 04/21/2023   Adenomatous colon polyp 04/21/2023   Idiopathic small fiber sensory neuropathy 07/18/2020   White coat syndrome without diagnosis of hypertension 04/09/2020   Neuropathic pain 09/12/2019   Osteopenia after menopause 03/22/2019   GERD (gastroesophageal reflux disease) 03/16/2019   Hiatal hernia 03/16/2019   Vitamin B12 deficiency 10/20/2018   Colon polyp 11/18/2017   History of hyperparathyroidism 01/08/2017   Hypothyroidism (acquired) 12/15/2016   Mixed hyperlipidemia 12/15/2016   Past Medical History:  Diagnosis Date   Allergy    SEASONAL   Arthritis    Back pain    GERD (gastroesophageal reflux disease)    Hiatal hernia 03/16/2019   History of tonsillectomy    Hyperlipidemia 12/15/2016   Hypothyroidism 12/15/2016   Osteopenia after menopause 03/22/2019   dexa 03/2019: T=-2.1 hip; Lspine excluded.    Osteoporosis    Sinus infection    Family History  Problem Relation Age of Onset   Hyperlipidemia Mother    Hypertension Mother    Congestive Heart Failure Mother    Heart disease Mother    Cancer Father    Diabetes Father    Heart disease Father    Hyperlipidemia Father    Colon polyps Father    Thyroid disease Sister    Colon polyps Sister    Drug abuse Brother    Diabetes Son    Colon cancer Neg Hx    Esophageal cancer Neg Hx    Stomach cancer Neg Hx    Rectal cancer Neg Hx    Past Surgical History:  Procedure Laterality Date    ADENOIDECTOMY     ANTERIOR CERVICAL DECOMP/DISCECTOMY FUSION  2002   COLONOSCOPY  10/06/2018   DILATION AND CURETTAGE OF UTERUS     X2   LEFT OOPHORECTOMY     PARATHYROIDECTOMY     POLYPECTOMY     SHOULDER SURGERY     TONSILLECTOMY     Social History   Occupational History   Occupation: retired  Tobacco Use   Smoking status: Former   Smokeless tobacco: Never  Tobacco comments:    Quit in 1985  Vaping Use   Vaping status: Never Used  Substance and Sexual Activity   Alcohol use: No   Drug use: No   Sexual activity: Yes

## 2023-06-02 ENCOUNTER — Encounter (HOSPITAL_COMMUNITY): Payer: Self-pay

## 2023-06-02 ENCOUNTER — Ambulatory Visit (HOSPITAL_COMMUNITY)
Admission: RE | Admit: 2023-06-02 | Discharge: 2023-06-02 | Disposition: A | Discharge status: Home / Self Care | Payer: Medicare Other | Source: Ambulatory Visit | Attending: Family Medicine | Admitting: Family Medicine

## 2023-06-02 DIAGNOSIS — M8580 Other specified disorders of bone density and structure, unspecified site: Secondary | ICD-10-CM | POA: Diagnosis not present

## 2023-06-02 DIAGNOSIS — Z780 Asymptomatic menopausal state: Secondary | ICD-10-CM | POA: Diagnosis not present

## 2023-06-02 DIAGNOSIS — Z1231 Encounter for screening mammogram for malignant neoplasm of breast: Secondary | ICD-10-CM | POA: Insufficient documentation

## 2023-06-02 DIAGNOSIS — M85851 Other specified disorders of bone density and structure, right thigh: Secondary | ICD-10-CM | POA: Diagnosis not present

## 2023-06-06 ENCOUNTER — Other Ambulatory Visit (HOSPITAL_COMMUNITY): Payer: Self-pay

## 2023-06-06 ENCOUNTER — Telehealth: Payer: Self-pay | Admitting: Pharmacy Technician

## 2023-06-06 NOTE — Telephone Encounter (Signed)
Pharmacy Patient Advocate Encounter   Received notification from CoverMyMeds that prior authorization for traMADol HCl 50MG  tablets is required/requested.   Insurance verification completed.   The patient is insured through [facility] .   Per test claim: PA required; PA submitted to above mentioned insurance via CoverMyMeds Key/confirmation #/EOC [id number] Status is pending

## 2023-06-08 ENCOUNTER — Other Ambulatory Visit (HOSPITAL_COMMUNITY): Payer: Self-pay

## 2023-06-08 NOTE — Telephone Encounter (Signed)
 Pharmacy Patient Advocate Encounter  Received notification from [facility] that Prior Authorization for traMADol  HCl 50MG  tablets  has been APPROVED from 06/06/2023 to 05/02/2098. Ran test claim, Copay is $17.37. This test claim was processed through [facility] copay amounts may vary at other pharmacies due to pharmacy/plan contracts, or as the patient moves through the different stages of their insurance plan.   PA #/Case ID/Reference #: [id number]

## 2023-06-15 ENCOUNTER — Ambulatory Visit (HOSPITAL_COMMUNITY): Payer: Medicare Other

## 2023-06-20 ENCOUNTER — Encounter (HOSPITAL_COMMUNITY): Payer: Self-pay

## 2023-06-20 ENCOUNTER — Ambulatory Visit (HOSPITAL_COMMUNITY): Payer: Medicare Other | Attending: Physical Medicine and Rehabilitation

## 2023-06-20 ENCOUNTER — Other Ambulatory Visit: Payer: Self-pay

## 2023-06-20 DIAGNOSIS — G8929 Other chronic pain: Secondary | ICD-10-CM | POA: Insufficient documentation

## 2023-06-20 DIAGNOSIS — M6281 Muscle weakness (generalized): Secondary | ICD-10-CM | POA: Insufficient documentation

## 2023-06-20 DIAGNOSIS — M7918 Myalgia, other site: Secondary | ICD-10-CM | POA: Diagnosis not present

## 2023-06-20 DIAGNOSIS — M545 Low back pain, unspecified: Secondary | ICD-10-CM | POA: Diagnosis not present

## 2023-06-20 NOTE — Therapy (Signed)
 OUTPATIENT PHYSICAL THERAPY THORACOLUMBAR EVALUATION   Patient Name: Latasha Mclaughlin MRN: [id number] DOB:August 25, 1945, 78 y.o., female Today's Date: 06/20/2023  END OF SESSION:  PT End of Session - 06/20/23 1507     Visit Number 1    Date for PT Re-Evaluation 08/01/23    Authorization Type Medicare part A & B    Authorization Time Period no auth, no limit    Progress Note Due on Visit 10    PT Start Time 1301    PT Stop Time 1345    PT Time Calculation (min) 44 min    Behavior During Therapy [facility] for tasks assessed/performed             Past Medical History:  Diagnosis Date   Allergy    SEASONAL   Arthritis    Back pain    GERD (gastroesophageal reflux disease)    Hiatal hernia 03/16/2019   History of tonsillectomy    Hyperlipidemia 12/15/2016   Hypothyroidism 12/15/2016   Osteopenia after menopause 03/22/2019   dexa 03/2019: T=-2.1 hip; Lspine excluded.    Osteoporosis    Sinus infection    Past Surgical History:  Procedure Laterality Date   ADENOIDECTOMY     ANTERIOR CERVICAL DECOMP/DISCECTOMY FUSION  2002   COLONOSCOPY  10/06/2018   DILATION AND CURETTAGE OF UTERUS     X2   LEFT OOPHORECTOMY     PARATHYROIDECTOMY     POLYPECTOMY     SHOULDER SURGERY     TONSILLECTOMY     Patient Active Problem List   Diagnosis Date Noted   IFG (impaired fasting glucose) 04/21/2023   Adenomatous colon polyp 04/21/2023   Idiopathic small fiber sensory neuropathy 07/18/2020   White coat syndrome without diagnosis of hypertension 04/09/2020   Neuropathic pain 09/12/2019   Osteopenia after menopause 03/22/2019   GERD (gastroesophageal reflux disease) 03/16/2019   Hiatal hernia 03/16/2019   Vitamin B12 deficiency 10/20/2018   Colon polyp 11/18/2017   History of hyperparathyroidism 01/08/2017   Hypothyroidism (acquired) 12/15/2016   Mixed hyperlipidemia 12/15/2016    PCP: Willow Ora, MD  REFERRING PROVIDER: Juanda Rebstock, NP  REFERRING DIAG:  Diagnosis   M54.50,G89.29 (ICD-10-CM) - Chronic bilateral low back pain without sciatica  M79.18 (ICD-10-CM) - Myofascial pain syndrome    Rationale for Evaluation and Treatment: Rehabilitation  THERAPY DIAG:  Chronic bilateral low back pain without sciatica  Myofascial pain  Muscle weakness (generalized)  ONSET DATE: > 5 months  SUBJECTIVE:  SUBJECTIVE STATEMENT: Pt reports her back pain has been ongoing for a few months. Pt reports R side more tight than left side. Pt reports worse at night than during the day. Pt reports some intensive cramping like pain that limited walking but after trigger point myofascial injection. Pt takes gabapentin and tramadol for loss of sensation in BLE due to COVID. Pt is not diabetic with no history of Peripheral neuropathy.  Normal day in the life: Pt reports taking her dog for a walk, spends time watching various TV shows. Pt reports going out for walks during the warmer months. Pt reports cooking causes increased back pain.   PERTINENT HISTORY:    PAIN:  Are you having pain? Yes: NPRS scale: 0/10 in 24-hours 4/10 Pain location: Low Back Pain description: dull, achy pain.  Aggravating factors: Vacuuming,daily activities Relieving factors: Ice, rest , heat, theragun.   PRECAUTIONS: None  RED FLAGS: None   WEIGHT BEARING RESTRICTIONS: No  FALLS:  Has patient fallen in last 6 months? No   PATIENT GOALS: "get out of pain"   OBJECTIVE:  Note: Objective measures were completed at Evaluation unless otherwise noted.  DIAGNOSTIC FINDINGS:   EXAM: MRI LUMBAR SPINE WITHOUT CONTRAST   TECHNIQUE: Multiplanar, multisequence MR imaging of the lumbar spine was performed. No intravenous contrast was administered.   COMPARISON:  Lumbar MRI 02/13/2019. Thoracic MRI  12/06/2019. Radiographs 02/03/2019.   FINDINGS: Segmentation: Conventional anatomy assumed, with the last open disc space designated L5-S1.Concordant with prior imaging.   Alignment:  Physiologic.   Vertebrae: No worrisome osseous lesion, acute fracture or pars defect. The visualized sacroiliac joints appear unremarkable.   Conus medullaris: Extends to the L1 level. The conus and cauda equina appear normal.   Paraspinal and other soft tissues: No significant paraspinal findings.   Disc levels:   Sagittal images demonstrate a chronic central disc extrusion with cephalad extension at T11-12, unchanged from previous MRI. No significant disc space findings at T12-L1.   L1-2: Mild loss of disc height with mild disc bulging and anterior osteophytes. No spinal stenosis or nerve root encroachment.   L2-3: Preserved disc height with mild disc bulging and mild facet hypertrophy. No spinal stenosis or nerve root encroachment.   L3-4: Preserved disc height with mild disc bulging and mild facet hypertrophy. No spinal stenosis or nerve root encroachment.   L4-5: Mildly progressive loss of disc height with mild disc bulging, facet and ligamentous hypertrophy. There is a shallow central disc protrusion contributing to mild triangulation of the thecal sac and mild lateral recess narrowing bilaterally. The foramina remain sufficiently patent.   L5-S1: Disc height and hydration are maintained. Stable moderate facet hypertrophy without resulting spinal stenosis or nerve root encroachment.   IMPRESSION: 1. 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 2. Stable moderate facet hypertrophy at L5-S1 without resulting spinal stenosis or nerve root encroachment. 3. No acute findings or clear explanation for the patient's symptoms. 4. 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  PATIENT SURVEYS:  Modified Oswestry 14/50 = 28%   COGNITION: Overall cognitive status: Within functional limits for tasks assessed     SENSATION: Impaired bilateral light touch sensation form Tibia distally.;    POSTURE: increased lumbar lordosis and increased thoracic kyphosis  PALPATION: Tender to palpate bilateral lumbar paraspinals and R PSIS.   LUMBAR ROM:   AROM eval  Flexion 70% and painful  Extension 70% and painful  Right lateral flexion 70% and painful  Left lateral flexion 70% and painful  Right rotation 70% and painful  Left rotation 70% and painful   (Blank rows = not tested)  LOWER EXTREMITY ROM:     Active  Right eval Left eval  Hip flexion    Hip extension    Hip abduction    Hip adduction    Hip internal rotation    Hip external rotation    Knee flexion    Knee extension    Ankle dorsiflexion    Ankle plantarflexion    Ankle inversion    Ankle eversion     (Blank rows = not tested)  LOWER EXTREMITY MMT:    MMT Right eval Left eval  Hip flexion 3+ 3+  Hip extension 3+ 3+  Hip abduction 3+ 3+  Hip adduction    Hip internal rotation    Hip external rotation    Knee flexion 3+ 3+  Knee extension    Ankle dorsiflexion    Ankle plantarflexion    Ankle inversion    Ankle eversion     (Blank rows = not tested)  LUMBAR SPECIAL TESTS:  Slump test: Positive  FUNCTIONAL TESTS:  30 seconds chair stand test: 7x 2 minute walk test: 23ft  GAIT: Distance walked: 267ft Assistive device utilized: None Level of assistance: Complete Independence Comments: Antalgic like gait pattern with excessive lateral trunk shift during ipsilateral stance phase with increased lumbar lordosis.   TREATMENT DATE:   06/20/2023 PT Evaluation                                                                                                                                 PATIENT  EDUCATION:  Education details: PT Evaluation, findings, prognosis, frequency, attendance policy, and HEP if given.  Person educated: Patient Education method: Medical illustrator Education comprehension: verbalized understanding  HOME EXERCISE PROGRAM: To initiate next session  ASSESSMENT:  CLINICAL IMPRESSION: Patient is a 78 y.o. female who was seen today for physical therapy evaluation and treatment for    M54.50,G89.29 (ICD-10-CM) - Chronic bilateral low back pain without sciatica  M79.18 (ICD-10-CM) - Myofascial pain syndrome   Pt with excessive lumbar lordosis and thoracic kyphosis upon observation and increased difficulty with moving outside of these patterns. Pt's pain seems more myofascial/musculoskelelal in nature due to significiant hip muscle weakness. Pt demonstrating limitations in ADLs, functional noted below due to objective impairments noted below. Pt will benefit from skilled Physical Therapy services to address deficits/limitations in order to improve functional and QOL.   OBJECTIVE IMPAIRMENTS: Abnormal gait, decreased activity tolerance, decreased balance, decreased mobility, difficulty walking, decreased ROM, decreased strength, hypomobility, postural dysfunction, and pain.   ACTIVITY LIMITATIONS: carrying, lifting, bending, standing, squatting, sleeping, transfers, and locomotion level  PARTICIPATION LIMITATIONS: cleaning, laundry, community activity, occupation, and yard work  PERSONAL FACTORS: Age are also affecting  patient's functional outcome.   REHAB POTENTIAL: Good  CLINICAL DECISION MAKING: Stable/uncomplicated  EVALUATION COMPLEXITY: Low   GOALS: Goals reviewed with patient? No  SHORT TERM GOALS: Target date: 07/11/2023  Pt will be independent with HEP in order to demonstrate participation in Physical Therapy POC.  Baseline: Goal status: INITIAL  2.  Pt will report 2/10 pain with mobility in order to demonstrate improved pain with  functional activities.  Baseline:  Goal status: INITIAL  LONG TERM GOALS: Target date: 08/01/2023  Pt will improve 30 Second Chair Stand test by /> 2 in order to demonstrate improved functional strength to return to desired activities.  Baseline: See objective.  Goal status: INITIAL  2.  Pt will improve 2 MWT by 141ft in order to demonstrate improved functional ambulatory capacity in community setting.  Baseline: See objective.  Goal status: INITIAL  3.  Pt will improve Modified Oswestry score by 10 points in order to demonstrate improved pain with functional goals and outcomes. Baseline: See objective.  Goal status: INITIAL  4.  Pt will report 0/10 pain with mobility in order to demonstrate reduced pain with overall functional status.  Baseline: See objective.  Goal status: INITIAL  PLAN:  PT FREQUENCY: 2x/week  PT DURATION: 6 weeks  PLANNED INTERVENTIONS: 97164- PT Re-evaluation, 97110-Therapeutic exercises, 97530- Therapeutic activity, 97112- Neuromuscular re-education, 97535- Self Care, 19147- Manual therapy, [phone]- Gait training, [phone]- Electrical stimulation (unattended), [phone]- Electrical stimulation (manual), Patient/Family education, Balance training, Stair training, Taping, Dry Needling, Joint mobilization, Joint manipulation, Spinal manipulation, Spinal mobilization, Cryotherapy, and Moist heat.  PLAN FOR NEXT SESSION: Hip strengthening, initiate HEP, Lumbar ROM  Elie Goody, DPT [facility] [place] 336 [phone] office   Nelida Meuse, PT 06/20/2023, 3:14 PM

## 2023-06-23 ENCOUNTER — Encounter: Payer: Self-pay | Admitting: Family Medicine

## 2023-06-23 NOTE — Progress Notes (Signed)
 See mychart note.  Stable osteopenia. Lowest t - -2.4. recheck 2 years  Latasha Mclaughlin, Thank you for getting your bone density screening test done. I have reviewed the results. Your results show stable low bone mass; no new treatment recommendations are needed at this time. We will recheck in 2 years.   Sincerely, Dr. Mardelle Matte

## 2023-06-28 ENCOUNTER — Ambulatory Visit (HOSPITAL_COMMUNITY): Payer: Medicare Other

## 2023-06-28 DIAGNOSIS — G8929 Other chronic pain: Secondary | ICD-10-CM | POA: Diagnosis not present

## 2023-06-28 DIAGNOSIS — M6281 Muscle weakness (generalized): Secondary | ICD-10-CM

## 2023-06-28 DIAGNOSIS — M545 Low back pain, unspecified: Secondary | ICD-10-CM | POA: Diagnosis not present

## 2023-06-28 DIAGNOSIS — M7918 Myalgia, other site: Secondary | ICD-10-CM | POA: Diagnosis not present

## 2023-06-28 NOTE — Therapy (Signed)
 OUTPATIENT PHYSICAL THERAPY THORACOLUMBAR TREATMENT   Patient Name: Latasha Mclaughlin MRN: [id number] DOB:05-05-1945, 78 y.o., female Today's Date: 06/28/2023  END OF SESSION:  PT End of Session - 06/28/23 0846     Visit Number 2    Number of Visits 12    Date for PT Re-Evaluation 08/01/23    Authorization Type Medicare part A & B    Authorization Time Period no auth, no limit    Progress Note Due on Visit 10    PT Start Time 0846    PT Stop Time 0925    PT Time Calculation (min) 39 min    Behavior During Therapy [facility] for tasks assessed/performed             Past Medical History:  Diagnosis Date   Allergy    SEASONAL   Arthritis    Back pain    GERD (gastroesophageal reflux disease)    Hiatal hernia 03/16/2019   History of tonsillectomy    Hyperlipidemia 12/15/2016   Hypothyroidism 12/15/2016   Osteopenia after menopause 03/22/2019   dexa 03/2019: T=-2.1 hip; Lspine excluded.    Osteoporosis    Sinus infection    Past Surgical History:  Procedure Laterality Date   ADENOIDECTOMY     ANTERIOR CERVICAL DECOMP/DISCECTOMY FUSION  2002   COLONOSCOPY  10/06/2018   DILATION AND CURETTAGE OF UTERUS     X2   LEFT OOPHORECTOMY     PARATHYROIDECTOMY     POLYPECTOMY     SHOULDER SURGERY     TONSILLECTOMY     Patient Active Problem List   Diagnosis Date Noted   IFG (impaired fasting glucose) 04/21/2023   Adenomatous colon polyp 04/21/2023   Idiopathic small fiber sensory neuropathy 07/18/2020   White coat syndrome without diagnosis of hypertension 04/09/2020   Neuropathic pain 09/12/2019   Osteopenia after menopause 03/22/2019   GERD (gastroesophageal reflux disease) 03/16/2019   Hiatal hernia 03/16/2019   Vitamin B12 deficiency 10/20/2018   Colon polyp 11/18/2017   History of hyperparathyroidism 01/08/2017   Hypothyroidism (acquired) 12/15/2016   Mixed hyperlipidemia 12/15/2016    PCP: Willow Ora, MD  REFERRING PROVIDER: Juanda Soutar, NP  REFERRING  DIAG:  Diagnosis  M54.50,G89.29 (ICD-10-CM) - Chronic bilateral low back pain without sciatica  M79.18 (ICD-10-CM) - Myofascial pain syndrome    Rationale for Evaluation and Treatment: Rehabilitation  THERAPY DIAG:  Chronic bilateral low back pain without sciatica  Myofascial pain  Muscle weakness (generalized)  ONSET DATE: > 5 months  SUBJECTIVE:  SUBJECTIVE STATEMENT: Back is not feeling too bad today; 3/10; "good days and bad days"  EVAL:Pt reports her back pain has been ongoing for a few months. Pt reports R side more tight than left side. Pt reports worse at night than during the day. Pt reports some intensive cramping like pain that limited walking but after trigger point myofascial injection. Pt takes gabapentin and tramadol for loss of sensation in BLE due to COVID. Pt is not diabetic with no history of Peripheral neuropathy.  Normal day in the life: Pt reports taking her dog for a walk, spends time watching various TV shows. Pt reports going out for walks during the warmer months. Pt reports cooking causes increased back pain.   PERTINENT HISTORY:    PAIN:  Are you having pain? Yes: NPRS scale: 0/10 in 24-hours 4/10 Pain location: Low Back Pain description: dull, achy pain.  Aggravating factors: Vacuuming,daily activities Relieving factors: Ice, rest , heat, theragun.   PRECAUTIONS: None  RED FLAGS: None   WEIGHT BEARING RESTRICTIONS: No  FALLS:  Has patient fallen in last 6 months? No   PATIENT GOALS: "get out of pain"   OBJECTIVE:  Note: Objective measures were completed at Evaluation unless otherwise noted.  DIAGNOSTIC FINDINGS:   EXAM: MRI LUMBAR SPINE WITHOUT CONTRAST   TECHNIQUE: Multiplanar, multisequence MR imaging of the lumbar spine was performed. No  intravenous contrast was administered.   COMPARISON:  Lumbar MRI 02/13/2019. Thoracic MRI 12/06/2019. Radiographs 02/03/2019.   FINDINGS: Segmentation: Conventional anatomy assumed, with the last open disc space designated L5-S1.Concordant with prior imaging.   Alignment:  Physiologic.   Vertebrae: No worrisome osseous lesion, acute fracture or pars defect. The visualized sacroiliac joints appear unremarkable.   Conus medullaris: Extends to the L1 level. The conus and cauda equina appear normal.   Paraspinal and other soft tissues: No significant paraspinal findings.   Disc levels:   Sagittal images demonstrate a chronic central disc extrusion with cephalad extension at T11-12, unchanged from previous MRI. No significant disc space findings at T12-L1.   L1-2: Mild loss of disc height with mild disc bulging and anterior osteophytes. No spinal stenosis or nerve root encroachment.   L2-3: Preserved disc height with mild disc bulging and mild facet hypertrophy. No spinal stenosis or nerve root encroachment.   L3-4: Preserved disc height with mild disc bulging and mild facet hypertrophy. No spinal stenosis or nerve root encroachment.   L4-5: Mildly progressive loss of disc height with mild disc bulging, facet and ligamentous hypertrophy. There is a shallow central disc protrusion contributing to mild triangulation of the thecal sac and mild lateral recess narrowing bilaterally. The foramina remain sufficiently patent.   L5-S1: Disc height and hydration are maintained. Stable moderate facet hypertrophy without resulting spinal stenosis or nerve root encroachment.   IMPRESSION: 1. 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 2. Stable moderate facet hypertrophy at L5-S1 without resulting spinal stenosis or nerve root encroachment. 3. No acute  findings or clear explanation for the patient's symptoms. 4. 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  PATIENT SURVEYS:  Modified Oswestry 14/50 = 28%   COGNITION: Overall cognitive status: Within functional limits for tasks assessed     SENSATION: Impaired bilateral light touch sensation form Tibia distally.;    POSTURE: increased lumbar lordosis and increased thoracic kyphosis  PALPATION: Tender to palpate bilateral  lumbar paraspinals and R PSIS.   LUMBAR ROM:   AROM eval  Flexion 70% and painful  Extension 70% and painful  Right lateral flexion 70% and painful  Left lateral flexion 70% and painful  Right rotation 70% and painful  Left rotation 70% and painful   (Blank rows = not tested)  LOWER EXTREMITY ROM:     Active  Right eval Left eval  Hip flexion    Hip extension    Hip abduction    Hip adduction    Hip internal rotation    Hip external rotation    Knee flexion    Knee extension    Ankle dorsiflexion    Ankle plantarflexion    Ankle inversion    Ankle eversion     (Blank rows = not tested)  LOWER EXTREMITY MMT:    MMT Right eval Left eval  Hip flexion 3+ 3+  Hip extension 3+ 3+  Hip abduction 3+ 3+  Hip adduction    Hip internal rotation    Hip external rotation    Knee flexion 3+ 3+  Knee extension    Ankle dorsiflexion    Ankle plantarflexion    Ankle inversion    Ankle eversion     (Blank rows = not tested)  LUMBAR SPECIAL TESTS:  Slump test: Positive  FUNCTIONAL TESTS:  30 seconds chair stand test: 7x 2 minute walk test: 246ft  GAIT: Distance walked: 224ft Assistive device utilized: None Level of assistance: Complete Independence Comments: Antalgic like gait pattern with excessive lateral trunk shift during ipsilateral stance phase with increased lumbar lordosis.   TREATMENT DATE:  06/28/23 Initiated HEP Supine: Single knee to chest 3 x 10" LTR x 10 Abdominal  bracing 5" hold x 10 Seated  Piriformis stretching 3 x 10" Hamstring stretching 3 x 10" Scapular retractions 5 " hold x 10 Standing Heel raises 2 x 10 Hip abduction  x 10 each Hip extension x 10 each  06/20/2023 PT Evaluation                                                                                                                                 PATIENT EDUCATION:  Education details: PT Evaluation, findings, prognosis, frequency, attendance policy, and HEP if given.  Person educated: Patient Education method: Medical illustrator Education comprehension: verbalized understanding  HOME EXERCISE PROGRAM: Access Code: [facility] URL: [email or web address] Date: 06/28/2023 Prepared by: [facility] - Rehab  Exercises - Hooklying Single Knee to Chest Stretch  - 2 x daily - 7 x weekly - 1 sets - 3 reps - 10 sec hold - Supine Lower Trunk Rotation  - 2 x daily - 7 x weekly - 1 sets - 10 reps - Supine Transversus Abdominis Bracing - Hands on Stomach  - 2 x daily - 7 x weekly - 1 sets - 10 reps - 5 sec hold - Seated Piriformis Stretch with Trunk Bend  - 2  x daily - 7 x weekly - 1 sets - 3 reps - 10 sec hold - Seated Hamstring Stretch  - 2 x daily - 7 x weekly - 1 sets - 3 reps - 10 sec hold - Seated Scapular Retraction  - 2 x daily - 7 x weekly - 1 sets - 10 reps - 5 sec hold   ASSESSMENT:  CLINICAL IMPRESSION: Today's session started with initiating HEP; noted limited mobility with trunk rotation and more tightness right side of lumbar spine than left. Reports some discomfort with lying in supine but able to tolerate supine exercises without increased pain.   Needs cues to avoid trunk substitution with hip exercise today. Continues with thoracic kyphosis so added scapular retractions to session today.  Patient demonstrates appropriate fatigue; reports some soreness after treatment today.  Patient will benefit from continued skilled therapy services  to address deficits  and promote return to optimal function.        Patient is a 78 y.o. female who was seen today for physical therapy evaluation and treatment for    M54.50,G89.29 (ICD-10-CM) - Chronic bilateral low back pain without sciatica  M79.18 (ICD-10-CM) - Myofascial pain syndrome   Pt with excessive lumbar lordosis and thoracic kyphosis upon observation and increased difficulty with moving outside of these patterns. Pt's pain seems more myofascial/musculoskelelal in nature due to significiant hip muscle weakness. Pt demonstrating limitations in ADLs, functional noted below due to objective impairments noted below. Pt will benefit from skilled Physical Therapy services to address deficits/limitations in order to improve functional and QOL.   OBJECTIVE IMPAIRMENTS: Abnormal gait, decreased activity tolerance, decreased balance, decreased mobility, difficulty walking, decreased ROM, decreased strength, hypomobility, postural dysfunction, and pain.   ACTIVITY LIMITATIONS: carrying, lifting, bending, standing, squatting, sleeping, transfers, and locomotion level  PARTICIPATION LIMITATIONS: cleaning, laundry, community activity, occupation, and yard work  PERSONAL FACTORS: Age are also affecting patient's functional outcome.   REHAB POTENTIAL: Good  CLINICAL DECISION MAKING: Stable/uncomplicated  EVALUATION COMPLEXITY: Low   GOALS: Goals reviewed with patient? No  SHORT TERM GOALS: Target date: 07/11/2023  Pt will be independent with HEP in order to demonstrate participation in Physical Therapy POC.  Baseline: Goal status: in progress  2.  Pt will report 2/10 pain with mobility in order to demonstrate improved pain with functional activities.  Baseline:  Goal status: in progress  LONG TERM GOALS: Target date: 08/01/2023  Pt will improve 30 Second Chair Stand test by /> 2 in order to demonstrate improved functional strength to return to desired activities.  Baseline: See objective.  Goal  status: in progress  2.  Pt will improve 2 MWT by 138ft in order to demonstrate improved functional ambulatory capacity in community setting.  Baseline: See objective.  Goal status: in progress  3.  Pt will improve Modified Oswestry score by 10 points in order to demonstrate improved pain with functional goals and outcomes. Baseline: See objective.  Goal status: in progress  4.  Pt will report 0/10 pain with mobility in order to demonstrate reduced pain with overall functional status.  Baseline: See objective.  Goal status: in progress   PLAN:  PT FREQUENCY: 2x/week  PT DURATION: 6 weeks  PLANNED INTERVENTIONS: 97164- PT Re-evaluation, 97110-Therapeutic exercises, 97530- Therapeutic activity, 97112- Neuromuscular re-education, 97535- Self Care, 21308- Manual therapy, [phone]- Gait training, [phone]- Electrical stimulation (unattended), [phone]- Electrical stimulation (manual), Patient/Family education, Balance training, Stair training, Taping, Dry Needling, Joint mobilization, Joint manipulation, Spinal manipulation, Spinal mobilization, Cryotherapy, and Moist  heat.  PLAN FOR NEXT SESSION: Hip strengthening,  Lumbar ROM  9:31 AM, 06/28/23 Naileah Karg Small Elwood Bazinet MPT [facility]  [phone] Ph:[phone]

## 2023-06-30 ENCOUNTER — Encounter (HOSPITAL_COMMUNITY): Payer: Self-pay

## 2023-06-30 ENCOUNTER — Ambulatory Visit (HOSPITAL_COMMUNITY): Payer: Medicare Other

## 2023-06-30 DIAGNOSIS — M545 Low back pain, unspecified: Secondary | ICD-10-CM | POA: Diagnosis not present

## 2023-06-30 DIAGNOSIS — M7918 Myalgia, other site: Secondary | ICD-10-CM | POA: Diagnosis not present

## 2023-06-30 DIAGNOSIS — G8929 Other chronic pain: Secondary | ICD-10-CM | POA: Diagnosis not present

## 2023-06-30 DIAGNOSIS — M6281 Muscle weakness (generalized): Secondary | ICD-10-CM

## 2023-06-30 NOTE — Therapy (Signed)
 OUTPATIENT PHYSICAL THERAPY THORACOLUMBAR TREATMENT   Patient Name: Latasha Mclaughlin MRN: [id number] DOB:November 23, 1945, 78 y.o., female Today's Date: 06/30/2023  END OF SESSION:  PT End of Session - 06/30/23 1525     Visit Number 3    Number of Visits 12    Date for PT Re-Evaluation 08/01/23    Authorization Type Medicare part A & B    Authorization Time Period no auth, no limit    Progress Note Due on Visit 10    PT Start Time 1434    PT Stop Time 1515    PT Time Calculation (min) 41 min    Activity Tolerance Patient tolerated treatment well;Patient limited by pain    Behavior During Therapy [facility] for tasks assessed/performed              Past Medical History:  Diagnosis Date   Allergy    SEASONAL   Arthritis    Back pain    GERD (gastroesophageal reflux disease)    Hiatal hernia 03/16/2019   History of tonsillectomy    Hyperlipidemia 12/15/2016   Hypothyroidism 12/15/2016   Osteopenia after menopause 03/22/2019   dexa 03/2019: T=-2.1 hip; Lspine excluded.    Osteoporosis    Sinus infection    Past Surgical History:  Procedure Laterality Date   ADENOIDECTOMY     ANTERIOR CERVICAL DECOMP/DISCECTOMY FUSION  2002   COLONOSCOPY  10/06/2018   DILATION AND CURETTAGE OF UTERUS     X2   LEFT OOPHORECTOMY     PARATHYROIDECTOMY     POLYPECTOMY     SHOULDER SURGERY     TONSILLECTOMY     Patient Active Problem List   Diagnosis Date Noted   IFG (impaired fasting glucose) 04/21/2023   Adenomatous colon polyp 04/21/2023   Idiopathic small fiber sensory neuropathy 07/18/2020   White coat syndrome without diagnosis of hypertension 04/09/2020   Neuropathic pain 09/12/2019   Osteopenia after menopause 03/22/2019   GERD (gastroesophageal reflux disease) 03/16/2019   Hiatal hernia 03/16/2019   Vitamin B12 deficiency 10/20/2018   Colon polyp 11/18/2017   History of hyperparathyroidism 01/08/2017   Hypothyroidism (acquired) 12/15/2016   Mixed hyperlipidemia 12/15/2016     PCP: Willow Ora, MD  REFERRING PROVIDER: Juanda Schurman, NP  REFERRING DIAG:  Diagnosis  M54.50,G89.29 (ICD-10-CM) - Chronic bilateral low back pain without sciatica  M79.18 (ICD-10-CM) - Myofascial pain syndrome    Rationale for Evaluation and Treatment: Rehabilitation  THERAPY DIAG:  Chronic bilateral low back pain without sciatica  Myofascial pain  Muscle weakness (generalized)  ONSET DATE: > 5 months  SUBJECTIVE:  SUBJECTIVE STATEMENT: Pt states she has been pretty sore since the last session and feels the HEP (every night) has also added to the soreness.  EVAL:Pt reports her back pain has been ongoing for a few months. Pt reports R side more tight than left side. Pt reports worse at night than during the day. Pt reports some intensive cramping like pain that limited walking but after trigger point myofascial injection. Pt takes gabapentin and tramadol for loss of sensation in BLE due to COVID. Pt is not diabetic with no history of Peripheral neuropathy.  Normal day in the life: Pt reports taking her dog for a walk, spends time watching various TV shows. Pt reports going out for walks during the warmer months. Pt reports cooking causes increased back pain.   PERTINENT HISTORY:    PAIN:  Are you having pain? Yes: NPRS scale: 0/10 in 24-hours 4/10 Pain location: Low Back Pain description: dull, achy pain.  Aggravating factors: Vacuuming,daily activities Relieving factors: Ice, rest , heat, theragun.  06/30/23: 2/10 Pain reported PRECAUTIONS: None  RED FLAGS: None   WEIGHT BEARING RESTRICTIONS: No  FALLS:  Has patient fallen in last 6 months? No   PATIENT GOALS: "get out of pain"   OBJECTIVE:  Note: Objective measures were completed at Evaluation unless otherwise  noted.  DIAGNOSTIC FINDINGS:   EXAM: MRI LUMBAR SPINE WITHOUT CONTRAST   TECHNIQUE: Multiplanar, multisequence MR imaging of the lumbar spine was performed. No intravenous contrast was administered.   COMPARISON:  Lumbar MRI 02/13/2019. Thoracic MRI 12/06/2019. Radiographs 02/03/2019.   FINDINGS: Segmentation: Conventional anatomy assumed, with the last open disc space designated L5-S1.Concordant with prior imaging.   Alignment:  Physiologic.   Vertebrae: No worrisome osseous lesion, acute fracture or pars defect. The visualized sacroiliac joints appear unremarkable.   Conus medullaris: Extends to the L1 level. The conus and cauda equina appear normal.   Paraspinal and other soft tissues: No significant paraspinal findings.   Disc levels:   Sagittal images demonstrate a chronic central disc extrusion with cephalad extension at T11-12, unchanged from previous MRI. No significant disc space findings at T12-L1.   L1-2: Mild loss of disc height with mild disc bulging and anterior osteophytes. No spinal stenosis or nerve root encroachment.   L2-3: Preserved disc height with mild disc bulging and mild facet hypertrophy. No spinal stenosis or nerve root encroachment.   L3-4: Preserved disc height with mild disc bulging and mild facet hypertrophy. No spinal stenosis or nerve root encroachment.   L4-5: Mildly progressive loss of disc height with mild disc bulging, facet and ligamentous hypertrophy. There is a shallow central disc protrusion contributing to mild triangulation of the thecal sac and mild lateral recess narrowing bilaterally. The foramina remain sufficiently patent.   L5-S1: Disc height and hydration are maintained. Stable moderate facet hypertrophy without resulting spinal stenosis or nerve root encroachment.   IMPRESSION: 1. Mildly progressive spondylosis at L4-5 with a shallow central disc protrusion contributing to mild triangulation of the thecal  sac and mild lateral recess narrowing bilaterally. 2. Stable moderate facet hypertrophy at L5-S1 without resulting spinal stenosis or nerve root encroachment. 3. No acute findings or clear explanation for the patient's symptoms. 4. 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  PATIENT SURVEYS:  Modified Oswestry 14/50 = 28%   COGNITION: Overall cognitive status: Within functional limits for tasks assessed     SENSATION: Impaired bilateral light touch sensation form Tibia distally.;    POSTURE:  increased lumbar lordosis and increased thoracic kyphosis  PALPATION: Tender to palpate bilateral lumbar paraspinals and R PSIS.   LUMBAR ROM:   AROM eval  Flexion 70% and painful  Extension 70% and painful  Right lateral flexion 70% and painful  Left lateral flexion 70% and painful  Right rotation 70% and painful  Left rotation 70% and painful   (Blank rows = not tested)  LOWER EXTREMITY ROM:     Active  Right eval Left eval  Hip flexion    Hip extension    Hip abduction    Hip adduction    Hip internal rotation    Hip external rotation    Knee flexion    Knee extension    Ankle dorsiflexion    Ankle plantarflexion    Ankle inversion    Ankle eversion     (Blank rows = not tested)  LOWER EXTREMITY MMT:    MMT Right eval Left eval  Hip flexion 3+ 3+  Hip extension 3+ 3+  Hip abduction 3+ 3+  Hip adduction    Hip internal rotation    Hip external rotation    Knee flexion 3+ 3+  Knee extension    Ankle dorsiflexion    Ankle plantarflexion    Ankle inversion    Ankle eversion     (Blank rows = not tested)  LUMBAR SPECIAL TESTS:  Slump test: Positive  FUNCTIONAL TESTS:  30 seconds chair stand test: 7x 2 minute walk test: 254ft  GAIT: Distance walked: 240ft Assistive device utilized: None Level of assistance: Complete Independence Comments: Antalgic like gait pattern with excessive lateral trunk  shift during ipsilateral stance phase with increased lumbar lordosis.   TREATMENT DATE:  06/30/2023   Therapeutic Exercise: -Nu-step, seat 7, level 2, 5' -STS, 1 set, 5 reps (some pain) -3 way hip, bilateral, 2 sets of 5 reps -GTB side steps, 2 laps, 20 feet -GTB monster walking, 2 laps, 20 feet -Marching in place 2 minutes, one UE support on counter top, pt cued for hip flexion  Neuromuscular Re-education: Supune LTR, 1 minute, pt cued for decreased ROM due to pain levels Supine PPT, 1 set of 10 reps, 3 second holds   06/28/23 Initiated HEP Supine: Single knee to chest 3 x 10" LTR x 10 Abdominal bracing 5" hold x 10 Seated  Piriformis stretching 3 x 10" Hamstring stretching 3 x 10" Scapular retractions 5 " hold x 10 Standing Heel raises 2 x 10 Hip abduction  x 10 each Hip extension x 10 each  06/20/2023 PT Evaluation                                                                                                                                 PATIENT EDUCATION:  Education details: PT Evaluation, findings, prognosis, frequency, attendance policy, and HEP if given. Pt was also educated on pain management strategies at counter top by elevating one LE on stool  for decreased symptoms during cooking.  Person educated: Patient Education method: Medical illustrator Education comprehension: verbalized understanding  HOME EXERCISE PROGRAM: Access Code: [facility] URL: [email or web address] Date: 06/28/2023 Prepared by: [facility] - Rehab  Exercises - Hooklying Single Knee to Chest Stretch  - 2 x daily - 7 x weekly - 1 sets - 3 reps - 10 sec hold - Supine Lower Trunk Rotation  - 2 x daily - 7 x weekly - 1 sets - 10 reps - Supine Transversus Abdominis Bracing - Hands on Stomach  - 2 x daily - 7 x weekly - 1 sets - 10 reps - 5 sec hold - Seated Piriformis Stretch with Trunk Bend  - 2 x daily - 7 x weekly - 1 sets - 3 reps - 10 sec hold - Seated Hamstring Stretch   - 2 x daily - 7 x weekly - 1 sets - 3 reps - 10 sec hold - Seated Scapular Retraction  - 2 x daily - 7 x weekly - 1 sets - 10 reps - 5 sec hold   ASSESSMENT:  CLINICAL IMPRESSION: Pt hesitant to perform supine activities due to pain in supine position. Standing and upright exercises were utilized for the majority of the session. Pt continues to demonstrate pain with STS as well as struggling with balance due to numbness and tingling sensations. Patient would continue to benefit from skilled physical therapy for increased pain-free lumbar ROM, improved quality of life, and continued progress towards therapy goals.     Patient is a 78 y.o. female who was seen today for physical therapy evaluation and treatment for    M54.50,G89.29 (ICD-10-CM) - Chronic bilateral low back pain without sciatica  M79.18 (ICD-10-CM) - Myofascial pain syndrome   Pt with excessive lumbar lordosis and thoracic kyphosis upon observation and increased difficulty with moving outside of these patterns. Pt's pain seems more myofascial/musculoskelelal in nature due to significiant hip muscle weakness. Pt demonstrating limitations in ADLs, functional noted below due to objective impairments noted below. Pt will benefit from skilled Physical Therapy services to address deficits/limitations in order to improve functional and QOL.   OBJECTIVE IMPAIRMENTS: Abnormal gait, decreased activity tolerance, decreased balance, decreased mobility, difficulty walking, decreased ROM, decreased strength, hypomobility, postural dysfunction, and pain.   ACTIVITY LIMITATIONS: carrying, lifting, bending, standing, squatting, sleeping, transfers, and locomotion level  PARTICIPATION LIMITATIONS: cleaning, laundry, community activity, occupation, and yard work  PERSONAL FACTORS: Age are also affecting patient's functional outcome.   REHAB POTENTIAL: Good  CLINICAL DECISION MAKING: Stable/uncomplicated  EVALUATION COMPLEXITY:  Low   GOALS: Goals reviewed with patient? No  SHORT TERM GOALS: Target date: 07/11/2023  Pt will be independent with HEP in order to demonstrate participation in Physical Therapy POC.  Baseline: Goal status: in progress  2.  Pt will report 2/10 pain with mobility in order to demonstrate improved pain with functional activities.  Baseline:  Goal status: in progress  LONG TERM GOALS: Target date: 08/01/2023  Pt will improve 30 Second Chair Stand test by /> 2 in order to demonstrate improved functional strength to return to desired activities.  Baseline: See objective.  Goal status: in progress  2.  Pt will improve 2 MWT by 111ft in order to demonstrate improved functional ambulatory capacity in community setting.  Baseline: See objective.  Goal status: in progress  3.  Pt will improve Modified Oswestry score by 10 points in order to demonstrate improved pain with functional goals and outcomes. Baseline: See objective.  Goal status: in  progress  4.  Pt will report 0/10 pain with mobility in order to demonstrate reduced pain with overall functional status.  Baseline: See objective.  Goal status: in progress   PLAN:  PT FREQUENCY: 2x/week  PT DURATION: 6 weeks  PLANNED INTERVENTIONS: 97164- PT Re-evaluation, 97110-Therapeutic exercises, 97530- Therapeutic activity, 97112- Neuromuscular re-education, 97535- Self Care, 16109- Manual therapy, [phone]- Gait training, [phone]- Electrical stimulation (unattended), [phone]- Electrical stimulation (manual), Patient/Family education, Balance training, Stair training, Taping, Dry Needling, Joint mobilization, Joint manipulation, Spinal manipulation, Spinal mobilization, Cryotherapy, and Moist heat.  PLAN FOR NEXT SESSION: Attempt extension based exercises, progress with dynamic gait with core stabilization.  Luz Lex, PT, DPT [facility] Office: [phone]

## 2023-07-05 ENCOUNTER — Ambulatory Visit (HOSPITAL_COMMUNITY): Payer: Medicare Other | Attending: Physical Medicine and Rehabilitation

## 2023-07-05 ENCOUNTER — Encounter (HOSPITAL_COMMUNITY): Payer: Self-pay

## 2023-07-05 DIAGNOSIS — M6281 Muscle weakness (generalized): Secondary | ICD-10-CM | POA: Diagnosis not present

## 2023-07-05 DIAGNOSIS — M545 Low back pain, unspecified: Secondary | ICD-10-CM | POA: Insufficient documentation

## 2023-07-05 DIAGNOSIS — G8929 Other chronic pain: Secondary | ICD-10-CM | POA: Insufficient documentation

## 2023-07-05 DIAGNOSIS — M7918 Myalgia, other site: Secondary | ICD-10-CM | POA: Insufficient documentation

## 2023-07-05 NOTE — Therapy (Signed)
 OUTPATIENT PHYSICAL THERAPY THORACOLUMBAR TREATMENT   Patient Name: Latasha Mclaughlin MRN: [id number] DOB:1945/10/08, 78 y.o., female Today's Date: 07/05/2023  END OF SESSION:  PT End of Session - 07/05/23 1504     Visit Number 4    Number of Visits 12    Date for PT Re-Evaluation 08/01/23    Authorization Type Medicare part A & B    Authorization Time Period no auth no limit    Progress Note Due on Visit 10    PT Start Time 1430    PT Stop Time 1512    PT Time Calculation (min) 42 min    Activity Tolerance Patient tolerated treatment well;Patient limited by pain    Behavior During Therapy [facility] for tasks assessed/performed               Past Medical History:  Diagnosis Date   Allergy    SEASONAL   Arthritis    Back pain    GERD (gastroesophageal reflux disease)    Hiatal hernia 03/16/2019   History of tonsillectomy    Hyperlipidemia 12/15/2016   Hypothyroidism 12/15/2016   Osteopenia after menopause 03/22/2019   dexa 03/2019: T=-2.1 hip; Lspine excluded.    Osteoporosis    Sinus infection    Past Surgical History:  Procedure Laterality Date   ADENOIDECTOMY     ANTERIOR CERVICAL DECOMP/DISCECTOMY FUSION  2002   COLONOSCOPY  10/06/2018   DILATION AND CURETTAGE OF UTERUS     X2   LEFT OOPHORECTOMY     PARATHYROIDECTOMY     POLYPECTOMY     SHOULDER SURGERY     TONSILLECTOMY     Patient Active Problem List   Diagnosis Date Noted   IFG (impaired fasting glucose) 04/21/2023   Adenomatous colon polyp 04/21/2023   Idiopathic small fiber sensory neuropathy 07/18/2020   White coat syndrome without diagnosis of hypertension 04/09/2020   Neuropathic pain 09/12/2019   Osteopenia after menopause 03/22/2019   GERD (gastroesophageal reflux disease) 03/16/2019   Hiatal hernia 03/16/2019   Vitamin B12 deficiency 10/20/2018   Colon polyp 11/18/2017   History of hyperparathyroidism 01/08/2017   Hypothyroidism (acquired) 12/15/2016   Mixed hyperlipidemia 12/15/2016     PCP: Willow Ora, MD  REFERRING PROVIDER: Juanda Maille, NP  REFERRING DIAG:  Diagnosis  M54.50,G89.29 (ICD-10-CM) - Chronic bilateral low back pain without sciatica  M79.18 (ICD-10-CM) - Myofascial pain syndrome    Rationale for Evaluation and Treatment: Rehabilitation  THERAPY DIAG:  Chronic bilateral low back pain without sciatica  Myofascial pain  Muscle weakness (generalized)  ONSET DATE: > 5 months  SUBJECTIVE:  SUBJECTIVE STATEMENT: 07/05/2023: Pt states that she has been feeling good since the last session. Pt reports 0/10 pain upon presentation. Pt states her feet are burning awful and feels her balance may be effected by this.   EVAL:Pt reports her back pain has been ongoing for a few months. Pt reports R side more tight than left side. Pt reports worse at night than during the day. Pt reports some intensive cramping like pain that limited walking but after trigger point myofascial injection. Pt takes gabapentin and tramadol for loss of sensation in BLE due to COVID. Pt is not diabetic with no history of Peripheral neuropathy.  Normal day in the life: Pt reports taking her dog for a walk, spends time watching various TV shows. Pt reports going out for walks during the warmer months. Pt reports cooking causes increased back pain.   PERTINENT HISTORY:    PAIN:  Are you having pain? Yes: NPRS scale: 0/10 in 24-hours 4/10 Pain location: Low Back Pain description: dull, achy pain.  Aggravating factors: Vacuuming,daily activities Relieving factors: Ice, rest , heat, theragun.  06/30/23: 2/10 Pain reported PRECAUTIONS: None  RED FLAGS: None   WEIGHT BEARING RESTRICTIONS: No  FALLS:  Has patient fallen in last 6 months? No   PATIENT GOALS: "get out of  pain"   OBJECTIVE:  Note: Objective measures were completed at Evaluation unless otherwise noted.  DIAGNOSTIC FINDINGS:   EXAM: MRI LUMBAR SPINE WITHOUT CONTRAST   TECHNIQUE: Multiplanar, multisequence MR imaging of the lumbar spine was performed. No intravenous contrast was administered.   COMPARISON:  Lumbar MRI 02/13/2019. Thoracic MRI 12/06/2019. Radiographs 02/03/2019.   FINDINGS: Segmentation: Conventional anatomy assumed, with the last open disc space designated L5-S1.Concordant with prior imaging.   Alignment:  Physiologic.   Vertebrae: No worrisome osseous lesion, acute fracture or pars defect. The visualized sacroiliac joints appear unremarkable.   Conus medullaris: Extends to the L1 level. The conus and cauda equina appear normal.   Paraspinal and other soft tissues: No significant paraspinal findings.   Disc levels:   Sagittal images demonstrate a chronic central disc extrusion with cephalad extension at T11-12, unchanged from previous MRI. No significant disc space findings at T12-L1.   L1-2: Mild loss of disc height with mild disc bulging and anterior osteophytes. No spinal stenosis or nerve root encroachment.   L2-3: Preserved disc height with mild disc bulging and mild facet hypertrophy. No spinal stenosis or nerve root encroachment.   L3-4: Preserved disc height with mild disc bulging and mild facet hypertrophy. No spinal stenosis or nerve root encroachment.   L4-5: Mildly progressive loss of disc height with mild disc bulging, facet and ligamentous hypertrophy. There is a shallow central disc protrusion contributing to mild triangulation of the thecal sac and mild lateral recess narrowing bilaterally. The foramina remain sufficiently patent.   L5-S1: Disc height and hydration are maintained. Stable moderate facet hypertrophy without resulting spinal stenosis or nerve root encroachment.   IMPRESSION: 1. Mildly progressive spondylosis at L4-5  with a shallow central disc protrusion contributing to mild triangulation of the thecal sac and mild lateral recess narrowing bilaterally. 2. Stable moderate facet hypertrophy at L5-S1 without resulting spinal stenosis or nerve root encroachment. 3. No acute findings or clear explanation for the patient's symptoms. 4. 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  PATIENT SURVEYS:  Modified Oswestry 14/50 = 28%   COGNITION: Overall cognitive status: Within functional limits for tasks assessed  SENSATION: Impaired bilateral light touch sensation form Tibia distally.;    POSTURE: increased lumbar lordosis and increased thoracic kyphosis  PALPATION: Tender to palpate bilateral lumbar paraspinals and R PSIS.   LUMBAR ROM:   AROM eval  Flexion 70% and painful  Extension 70% and painful  Right lateral flexion 70% and painful  Left lateral flexion 70% and painful  Right rotation 70% and painful  Left rotation 70% and painful   (Blank rows = not tested)  LOWER EXTREMITY ROM:     Active  Right eval Left eval  Hip flexion    Hip extension    Hip abduction    Hip adduction    Hip internal rotation    Hip external rotation    Knee flexion    Knee extension    Ankle dorsiflexion    Ankle plantarflexion    Ankle inversion    Ankle eversion     (Blank rows = not tested)  LOWER EXTREMITY MMT:    MMT Right eval Left eval  Hip flexion 3+ 3+  Hip extension 3+ 3+  Hip abduction 3+ 3+  Hip adduction    Hip internal rotation    Hip external rotation    Knee flexion 3+ 3+  Knee extension    Ankle dorsiflexion    Ankle plantarflexion    Ankle inversion    Ankle eversion     (Blank rows = not tested)  LUMBAR SPECIAL TESTS:  Slump test: Positive  FUNCTIONAL TESTS:  30 seconds chair stand test: 7x 2 minute walk test: 271ft  GAIT: Distance walked: 237ft Assistive device utilized: None Level of assistance:  Complete Independence Comments: Antalgic like gait pattern with excessive lateral trunk shift during ipsilateral stance phase with increased lumbar lordosis.   TREATMENT DATE:  07/05/2023  Therapeutic Exercise: -Nu-step, seat 7, level 2, 5', pt cued for 70 spm -Quadruped, alternating LE liftoffs, 1 set of 8 reps -Quadruped, UE and LE liftoffs, 1 set of 8 reps -Fwd lunge with one UE support on chair, 1 sets of 10 reps, pt cued for upright posture and neutral spine -3 way hip, bilateral, 1 sets of 5 reps, pt reports pain with side and back  -Standing Red physioball core pushdowns (fwd, left, right) 1 set of 7 reps each direction  Neuromuscular Re-education: Supune LTR, 1 minute, pt cued for decreased ROM due to pain levels Supine PPT, 2 set of 10 reps, 3 second holds   06/30/2023  Therapeutic Exercise: -Nu-step, seat 7, level 2, 5' -STS, 1 set, 5 reps (some pain) -3 way hip, bilateral, 2 sets of 5 reps -GTB side steps, 2 laps, 20 feet -GTB monster walking, 2 laps, 20 feet -Marching in place 2 minutes, one UE support on counter top, pt cued for hip flexion  Neuromuscular Re-education: Supune LTR, 1 minute, pt cued for decreased ROM due to pain levels Supine PPT, 1 set of 10 reps, 3 second holds   06/28/23 Initiated HEP Supine: Single knee to chest 3 x 10" LTR x 10 Abdominal bracing 5" hold x 10 Seated  Piriformis stretching 3 x 10" Hamstring stretching 3 x 10" Scapular retractions 5 " hold x 10 Standing Heel raises 2 x 10 Hip abduction  x 10 each Hip extension x 10 each  06/20/2023 PT Evaluation  PATIENT EDUCATION:  Education details: PT Evaluation, findings, prognosis, frequency, attendance policy, and HEP if given. Pt was also educated on pain management strategies at counter top by elevating one LE on stool for decreased symptoms during cooking.   Person educated: Patient Education method: Medical illustrator Education comprehension: verbalized understanding  HOME EXERCISE PROGRAM: Access Code: [facility] URL: [email or web address] Date: 06/28/2023 Prepared by: [facility] - Rehab  Exercises - Hooklying Single Knee to Chest Stretch  - 2 x daily - 7 x weekly - 1 sets - 3 reps - 10 sec hold - Supine Lower Trunk Rotation  - 2 x daily - 7 x weekly - 1 sets - 10 reps - Supine Transversus Abdominis Bracing - Hands on Stomach  - 2 x daily - 7 x weekly - 1 sets - 10 reps - 5 sec hold - Seated Piriformis Stretch with Trunk Bend  - 2 x daily - 7 x weekly - 1 sets - 3 reps - 10 sec hold - Seated Hamstring Stretch  - 2 x daily - 7 x weekly - 1 sets - 3 reps - 10 sec hold - Seated Scapular Retraction  - 2 x daily - 7 x weekly - 1 sets - 10 reps - 5 sec hold   ASSESSMENT:  CLINICAL IMPRESSION: Pt was able to complete more movements in the supine position today which is an improvement since previous session. Pt demonstrates increased pain with standing 3-way hip with RTB with abduction and extension bilaterally. Pt continues to demonstrate slowed gait and functional transfers throughout the session. Patient would continue to benefit from skilled physical therapy for increased lumbar pain free ROM, improved quality of life, and continued progress towards therapy goals.    Patient is a 78 y.o. female who was seen today for physical therapy evaluation and treatment for    M54.50,G89.29 (ICD-10-CM) - Chronic bilateral low back pain without sciatica  M79.18 (ICD-10-CM) - Myofascial pain syndrome   Pt with excessive lumbar lordosis and thoracic kyphosis upon observation and increased difficulty with moving outside of these patterns. Pt's pain seems more myofascial/musculoskelelal in nature due to significiant hip muscle weakness. Pt demonstrating limitations in ADLs, functional noted below due to objective impairments noted below. Pt  will benefit from skilled Physical Therapy services to address deficits/limitations in order to improve functional and QOL.   OBJECTIVE IMPAIRMENTS: Abnormal gait, decreased activity tolerance, decreased balance, decreased mobility, difficulty walking, decreased ROM, decreased strength, hypomobility, postural dysfunction, and pain.   ACTIVITY LIMITATIONS: carrying, lifting, bending, standing, squatting, sleeping, transfers, and locomotion level  PARTICIPATION LIMITATIONS: cleaning, laundry, community activity, occupation, and yard work  PERSONAL FACTORS: Age are also affecting patient's functional outcome.   REHAB POTENTIAL: Good  CLINICAL DECISION MAKING: Stable/uncomplicated  EVALUATION COMPLEXITY: Low   GOALS: Goals reviewed with patient? No  SHORT TERM GOALS: Target date: 07/11/2023  Pt will be independent with HEP in order to demonstrate participation in Physical Therapy POC.  Baseline: Goal status: in progress  2.  Pt will report 2/10 pain with mobility in order to demonstrate improved pain with functional activities.  Baseline:  Goal status: in progress  LONG TERM GOALS: Target date: 08/01/2023  Pt will improve 30 Second Chair Stand test by /> 2 in order to demonstrate improved functional strength to return to desired activities.  Baseline: See objective.  Goal status: in progress  2.  Pt will improve 2 MWT by 123ft in order to demonstrate improved functional ambulatory capacity in community setting.  Baseline: See  objective.  Goal status: in progress  3.  Pt will improve Modified Oswestry score by 10 points in order to demonstrate improved pain with functional goals and outcomes. Baseline: See objective.  Goal status: in progress  4.  Pt will report 0/10 pain with mobility in order to demonstrate reduced pain with overall functional status.  Baseline: See objective.  Goal status: in progress   PLAN:  PT FREQUENCY: 2x/week  PT DURATION: 6 weeks  PLANNED  INTERVENTIONS: 97164- PT Re-evaluation, 97110-Therapeutic exercises, 97530- Therapeutic activity, 97112- Neuromuscular re-education, 97535- Self Care, 16109- Manual therapy, [phone]- Gait training, [phone]- Electrical stimulation (unattended), [phone]- Electrical stimulation (manual), Patient/Family education, Balance training, Stair training, Taping, Dry Needling, Joint mobilization, Joint manipulation, Spinal manipulation, Spinal mobilization, Cryotherapy, and Moist heat.  PLAN FOR NEXT SESSION: Attempt extension based exercises, progress with dynamic gait with core stabilization, reintroduce banded hip/walking exercises as tolerated.  Luz Lex, PT, DPT [facility] Office: [phone]

## 2023-07-07 ENCOUNTER — Ambulatory Visit (HOSPITAL_COMMUNITY): Payer: Medicare Other

## 2023-07-07 ENCOUNTER — Encounter (HOSPITAL_COMMUNITY): Payer: Self-pay

## 2023-07-07 DIAGNOSIS — G8929 Other chronic pain: Secondary | ICD-10-CM | POA: Diagnosis not present

## 2023-07-07 DIAGNOSIS — M6281 Muscle weakness (generalized): Secondary | ICD-10-CM

## 2023-07-07 DIAGNOSIS — M7918 Myalgia, other site: Secondary | ICD-10-CM | POA: Diagnosis not present

## 2023-07-07 DIAGNOSIS — M545 Low back pain, unspecified: Secondary | ICD-10-CM | POA: Diagnosis not present

## 2023-07-07 NOTE — Therapy (Signed)
 OUTPATIENT PHYSICAL THERAPY THORACOLUMBAR TREATMENT   Patient Name: Latasha Mclaughlin MRN: [id number] DOB:1946-03-27, 78 y.o., female Today's Date: 07/07/2023  END OF SESSION:  PT End of Session - 07/07/23 1524     Visit Number 5    Number of Visits 12    Date for PT Re-Evaluation 08/01/23    Authorization Type Medicare part A & B    Authorization Time Period no auth no limit    Progress Note Due on Visit 10    PT Start Time 1431    PT Stop Time 1515    PT Time Calculation (min) 44 min    Activity Tolerance Patient tolerated treatment well;Patient limited by pain    Behavior During Therapy [facility] for tasks assessed/performed                Past Medical History:  Diagnosis Date   Allergy    SEASONAL   Arthritis    Back pain    GERD (gastroesophageal reflux disease)    Hiatal hernia 03/16/2019   History of tonsillectomy    Hyperlipidemia 12/15/2016   Hypothyroidism 12/15/2016   Osteopenia after menopause 03/22/2019   dexa 03/2019: T=-2.1 hip; Lspine excluded.    Osteoporosis    Sinus infection    Past Surgical History:  Procedure Laterality Date   ADENOIDECTOMY     ANTERIOR CERVICAL DECOMP/DISCECTOMY FUSION  2002   COLONOSCOPY  10/06/2018   DILATION AND CURETTAGE OF UTERUS     X2   LEFT OOPHORECTOMY     PARATHYROIDECTOMY     POLYPECTOMY     SHOULDER SURGERY     TONSILLECTOMY     Patient Active Problem List   Diagnosis Date Noted   IFG (impaired fasting glucose) 04/21/2023   Adenomatous colon polyp 04/21/2023   Idiopathic small fiber sensory neuropathy 07/18/2020   White coat syndrome without diagnosis of hypertension 04/09/2020   Neuropathic pain 09/12/2019   Osteopenia after menopause 03/22/2019   GERD (gastroesophageal reflux disease) 03/16/2019   Hiatal hernia 03/16/2019   Vitamin B12 deficiency 10/20/2018   Colon polyp 11/18/2017   History of hyperparathyroidism 01/08/2017   Hypothyroidism (acquired) 12/15/2016   Mixed hyperlipidemia 12/15/2016     PCP: Willow Ora, MD  REFERRING PROVIDER: Juanda Rust, NP  REFERRING DIAG:  Diagnosis  M54.50,G89.29 (ICD-10-CM) - Chronic bilateral low back pain without sciatica  M79.18 (ICD-10-CM) - Myofascial pain syndrome    Rationale for Evaluation and Treatment: Rehabilitation  THERAPY DIAG:  Chronic bilateral low back pain without sciatica  Muscle weakness (generalized)  ONSET DATE: > 5 months  SUBJECTIVE:  SUBJECTIVE STATEMENT: Pt states she has had a little bit of pain since last session but mainly soreness in legs.   EVAL:Pt reports her back pain has been ongoing for a few months. Pt reports R side more tight than left side. Pt reports worse at night than during the day. Pt reports some intensive cramping like pain that limited walking but after trigger point myofascial injection. Pt takes gabapentin and tramadol for loss of sensation in BLE due to COVID. Pt is not diabetic with no history of Peripheral neuropathy.  Normal day in the life: Pt reports taking her dog for a walk, spends time watching various TV shows. Pt reports going out for walks during the warmer months. Pt reports cooking causes increased back pain.   PERTINENT HISTORY:    PAIN:  Are you having pain? Yes: NPRS scale: 0/10 in 24-hours 4/10 Pain location: Low Back Pain description: dull, achy pain.  Aggravating factors: Vacuuming,daily activities Relieving factors: Ice, rest , heat, theragun.  06/30/23: 2/10 Pain reported PRECAUTIONS: None  RED FLAGS: None   WEIGHT BEARING RESTRICTIONS: No  FALLS:  Has patient fallen in last 6 months? No   PATIENT GOALS: "get out of pain"   OBJECTIVE:  Note: Objective measures were completed at Evaluation unless otherwise noted.  DIAGNOSTIC FINDINGS:   EXAM: MRI  LUMBAR SPINE WITHOUT CONTRAST   TECHNIQUE: Multiplanar, multisequence MR imaging of the lumbar spine was performed. No intravenous contrast was administered.   COMPARISON:  Lumbar MRI 02/13/2019. Thoracic MRI 12/06/2019. Radiographs 02/03/2019.   FINDINGS: Segmentation: Conventional anatomy assumed, with the last open disc space designated L5-S1.Concordant with prior imaging.   Alignment:  Physiologic.   Vertebrae: No worrisome osseous lesion, acute fracture or pars defect. The visualized sacroiliac joints appear unremarkable.   Conus medullaris: Extends to the L1 level. The conus and cauda equina appear normal.   Paraspinal and other soft tissues: No significant paraspinal findings.   Disc levels:   Sagittal images demonstrate a chronic central disc extrusion with cephalad extension at T11-12, unchanged from previous MRI. No significant disc space findings at T12-L1.   L1-2: Mild loss of disc height with mild disc bulging and anterior osteophytes. No spinal stenosis or nerve root encroachment.   L2-3: Preserved disc height with mild disc bulging and mild facet hypertrophy. No spinal stenosis or nerve root encroachment.   L3-4: Preserved disc height with mild disc bulging and mild facet hypertrophy. No spinal stenosis or nerve root encroachment.   L4-5: Mildly progressive loss of disc height with mild disc bulging, facet and ligamentous hypertrophy. There is a shallow central disc protrusion contributing to mild triangulation of the thecal sac and mild lateral recess narrowing bilaterally. The foramina remain sufficiently patent.   L5-S1: Disc height and hydration are maintained. Stable moderate facet hypertrophy without resulting spinal stenosis or nerve root encroachment.   IMPRESSION: 1. Mildly progressive spondylosis at L4-5 with a shallow central disc protrusion contributing to mild triangulation of the thecal sac and mild lateral recess narrowing  bilaterally. 2. Stable moderate facet hypertrophy at L5-S1 without resulting spinal stenosis or nerve root encroachment. 3. No acute findings or clear explanation for the patient's symptoms. 4. 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  PATIENT SURVEYS:  Modified Oswestry 14/50 = 28%   COGNITION: Overall cognitive status: Within functional limits for tasks assessed     SENSATION: Impaired bilateral light touch sensation form Tibia distally.;    POSTURE: increased lumbar lordosis and  increased thoracic kyphosis  PALPATION: Tender to palpate bilateral lumbar paraspinals and R PSIS.   LUMBAR ROM:   AROM eval  Flexion 70% and painful  Extension 70% and painful  Right lateral flexion 70% and painful  Left lateral flexion 70% and painful  Right rotation 70% and painful  Left rotation 70% and painful   (Blank rows = not tested)  LOWER EXTREMITY ROM:     Active  Right eval Left eval  Hip flexion    Hip extension    Hip abduction    Hip adduction    Hip internal rotation    Hip external rotation    Knee flexion    Knee extension    Ankle dorsiflexion    Ankle plantarflexion    Ankle inversion    Ankle eversion     (Blank rows = not tested)  LOWER EXTREMITY MMT:    MMT Right eval Left eval  Hip flexion 3+ 3+  Hip extension 3+ 3+  Hip abduction 3+ 3+  Hip adduction    Hip internal rotation    Hip external rotation    Knee flexion 3+ 3+  Knee extension    Ankle dorsiflexion    Ankle plantarflexion    Ankle inversion    Ankle eversion     (Blank rows = not tested)  LUMBAR SPECIAL TESTS:  Slump test: Positive  FUNCTIONAL TESTS:  30 seconds chair stand test: 7x 2 minute walk test: 216ft  GAIT: Distance walked: 275ft Assistive device utilized: None Level of assistance: Complete Independence Comments: Antalgic like gait pattern with excessive lateral trunk shift during ipsilateral stance phase with  increased lumbar lordosis.   TREATMENT DATE:  07/07/2023  Therapeutic Exercise: -Nu-step, seat 7, level 4, 6' -Supine bridge + march, 2 sets of 10 reps -GTB side steps, 2 laps, 20 feet -Childs pose to prone press up 1 set of 7 reps  Neuromuscular Re-education: -Supine LTR, 1 minute, pt cued for decreased ROM due to pain levels -Supine PPT, 2 set of 10 reps, 3 second holds -Quadruped LE lift offs, 1 set of 10 reps bilaterally  Therapeutic Activity: -Tidal wave lifts off of floor to counter, 5lbs, 2 sets of 5 reps, pt cued for decreased posterior pelvic tilt/lumbar flexion   07/05/2023  Therapeutic Exercise: -Nu-step, seat 7, level 2, 5', pt cued for 70 spm -Quadruped, alternating LE liftoffs, 1 set of 8 reps -Quadruped, UE and LE liftoffs, 1 set of 8 reps -Fwd lunge with one UE support on chair, 1 sets of 10 reps, pt cued for upright posture and neutral spine -3 way hip, bilateral, 1 sets of 5 reps, pt reports pain with side and back  -Standing Red physioball core pushdowns (fwd, left, right) 1 set of 7 reps each direction  Neuromuscular Re-education: Supune LTR, 1 minute, pt cued for decreased ROM due to pain levels Supine PPT, 2 set of 10 reps, 3 second holds   06/30/2023  Therapeutic Exercise: -Nu-step, seat 7, level 2, 5' -STS, 1 set, 5 reps (some pain) -3 way hip, bilateral, 2 sets of 5 reps -GTB side steps, 2 laps, 20 feet -GTB monster walking, 2 laps, 20 feet -Marching in place 2 minutes, one UE support on counter top, pt cued for hip flexion  Neuromuscular Re-education: Supune LTR, 1 minute, pt cued for decreased ROM due to pain levels Supine PPT, 1 set of 10 reps, 3 second holds   06/28/23 Initiated HEP Supine: Single knee to chest 3 x 10"  LTR x 10 Abdominal bracing 5" hold x 10 Seated  Piriformis stretching 3 x 10" Hamstring stretching 3 x 10" Scapular retractions 5 " hold x 10 Standing Heel raises 2 x 10 Hip abduction  x 10 each Hip extension x 10  each  06/20/2023 PT Evaluation                                                                                                                                 PATIENT EDUCATION:  Education details: PT Evaluation, findings, prognosis, frequency, attendance policy, and HEP if given. Pt was also educated on pain management strategies at counter top by elevating one LE on stool for decreased symptoms during cooking.  Person educated: Patient Education method: Medical illustrator Education comprehension: verbalized understanding  HOME EXERCISE PROGRAM: Access Code: [facility] Show Low URL: [email or web address] Date: 06/28/2023 Prepared by: AP - Rehab  Exercises - Hooklying Single Knee to Chest Stretch  - 2 x daily - 7 x weekly - 1 sets - 3 reps - 10 sec hold - Supine Lower Trunk Rotation  - 2 x daily - 7 x weekly - 1 sets - 10 reps - Supine Transversus Abdominis Bracing - Hands on Stomach  - 2 x daily - 7 x weekly - 1 sets - 10 reps - 5 sec hold - Seated Piriformis Stretch with Trunk Bend  - 2 x daily - 7 x weekly - 1 sets - 3 reps - 10 sec hold - Seated Hamstring Stretch  - 2 x daily - 7 x weekly - 1 sets - 3 reps - 10 sec hold - Seated Scapular Retraction  - 2 x daily - 7 x weekly - 1 sets - 10 reps - 5 sec hold   ASSESSMENT:  CLINICAL IMPRESSION: PT continued to target core, lumbar spine, and LE strengthening during today's session. Pt demonstrates good exercise recall with minimal verbal cues for familiar exercises. Pt does struggle a bit with anterior pelvic tilt movement during lifting activity but does improve with repetitions. Pt able to progress with quadruped activities and able to complete child pose to prone pressup stretch although pt requires cueing to avoid end ranges to avoid painful ROM. Patient would continue to benefit from skilled physical therapy for increased pain free lumbar ROM, improved lifting mechanics and increased LE strength for improved quality of  life, and continued progress towards therapy goals.    Patient is a 78 y.o. female who was seen today for physical therapy evaluation and treatment for    M54.50,G89.29 (ICD-10-CM) - Chronic bilateral low back pain without sciatica  M79.18 (ICD-10-CM) - Myofascial pain syndrome   Pt with excessive lumbar lordosis and thoracic kyphosis upon observation and increased difficulty with moving outside of these patterns. Pt's pain seems more myofascial/musculoskelelal in nature due to significiant hip muscle weakness. Pt demonstrating limitations in ADLs, functional noted below due to objective impairments noted below. Pt will benefit from skilled Physical Therapy  services to address deficits/limitations in order to improve functional and QOL.   OBJECTIVE IMPAIRMENTS: Abnormal gait, decreased activity tolerance, decreased balance, decreased mobility, difficulty walking, decreased ROM, decreased strength, hypomobility, postural dysfunction, and pain.   ACTIVITY LIMITATIONS: carrying, lifting, bending, standing, squatting, sleeping, transfers, and locomotion level  PARTICIPATION LIMITATIONS: cleaning, laundry, community activity, occupation, and yard work  PERSONAL FACTORS: Age are also affecting patient's functional outcome.   REHAB POTENTIAL: Good  CLINICAL DECISION MAKING: Stable/uncomplicated  EVALUATION COMPLEXITY: Low   GOALS: Goals reviewed with patient? No  SHORT TERM GOALS: Target date: 07/11/2023  Pt will be independent with HEP in order to demonstrate participation in Physical Therapy POC.  Baseline: Goal status: in progress  2.  Pt will report 2/10 pain with mobility in order to demonstrate improved pain with functional activities.  Baseline:  Goal status: in progress  LONG TERM GOALS: Target date: 08/01/2023  Pt will improve 30 Second Chair Stand test by /> 2 in order to demonstrate improved functional strength to return to desired activities.  Baseline: See objective.   Goal status: in progress  2.  Pt will improve 2 MWT by 120ft in order to demonstrate improved functional ambulatory capacity in community setting.  Baseline: See objective.  Goal status: in progress  3.  Pt will improve Modified Oswestry score by 10 points in order to demonstrate improved pain with functional goals and outcomes. Baseline: See objective.  Goal status: in progress  4.  Pt will report 0/10 pain with mobility in order to demonstrate reduced pain with overall functional status.  Baseline: See objective.  Goal status: in progress   PLAN:  PT FREQUENCY: 2x/week  PT DURATION: 6 weeks  PLANNED INTERVENTIONS: 97164- PT Re-evaluation, 97110-Therapeutic exercises, 97530- Therapeutic activity, 97112- Neuromuscular re-education, 97535- Self Care, [id number]- Manual therapy, [phone]- Gait training, [phone]- Electrical stimulation (unattended), [phone]- Electrical stimulation (manual), Patient/Family education, Balance training, Stair training, Taping, Dry Needling, Joint mobilization, Joint manipulation, Spinal manipulation, Spinal mobilization, Cryotherapy, and Moist heat.  PLAN FOR NEXT SESSION: Progress lifting activities with education on proper form. Also plan to educate pt on performing PPT and APT before leaving the supine position to avoid normal pain of weight bearing.  Luz Lex, PT, DPT [facility] Office: [phone]

## 2023-07-11 ENCOUNTER — Encounter (HOSPITAL_COMMUNITY): Payer: Self-pay

## 2023-07-11 ENCOUNTER — Ambulatory Visit (HOSPITAL_COMMUNITY): Payer: Medicare Other

## 2023-07-11 DIAGNOSIS — M545 Low back pain, unspecified: Secondary | ICD-10-CM

## 2023-07-11 DIAGNOSIS — M6281 Muscle weakness (generalized): Secondary | ICD-10-CM

## 2023-07-11 DIAGNOSIS — G8929 Other chronic pain: Secondary | ICD-10-CM | POA: Diagnosis not present

## 2023-07-11 DIAGNOSIS — M7918 Myalgia, other site: Secondary | ICD-10-CM

## 2023-07-11 NOTE — Therapy (Signed)
 OUTPATIENT PHYSICAL THERAPY THORACOLUMBAR TREATMENT   Patient Name: Latasha Mclaughlin MRN: [id number] DOB:04/22/1946, 78 y.o., female Today's Date: 07/11/2023  END OF SESSION:  PT End of Session - 07/11/23 1514     Visit Number 6    Number of Visits 12    Date for PT Re-Evaluation 08/01/23    Authorization Type Medicare part A & B    Authorization Time Period no auth no limit    Progress Note Due on Visit 8    PT Start Time 1432    PT Stop Time 1509    PT Time Calculation (min) 37 min    Activity Tolerance Patient tolerated treatment well    Behavior During Therapy [facility] for tasks assessed/performed                 Past Medical History:  Diagnosis Date   Allergy    SEASONAL   Arthritis    Back pain    GERD (gastroesophageal reflux disease)    Hiatal hernia 03/16/2019   History of tonsillectomy    Hyperlipidemia 12/15/2016   Hypothyroidism 12/15/2016   Osteopenia after menopause 03/22/2019   dexa 03/2019: T=-2.1 hip; Lspine excluded.    Osteoporosis    Sinus infection    Past Surgical History:  Procedure Laterality Date   ADENOIDECTOMY     ANTERIOR CERVICAL DECOMP/DISCECTOMY FUSION  2002   COLONOSCOPY  10/06/2018   DILATION AND CURETTAGE OF UTERUS     X2   LEFT OOPHORECTOMY     PARATHYROIDECTOMY     POLYPECTOMY     SHOULDER SURGERY     TONSILLECTOMY     Patient Active Problem List   Diagnosis Date Noted   IFG (impaired fasting glucose) 04/21/2023   Adenomatous colon polyp 04/21/2023   Idiopathic small fiber sensory neuropathy 07/18/2020   White coat syndrome without diagnosis of hypertension 04/09/2020   Neuropathic pain 09/12/2019   Osteopenia after menopause 03/22/2019   GERD (gastroesophageal reflux disease) 03/16/2019   Hiatal hernia 03/16/2019   Vitamin B12 deficiency 10/20/2018   Colon polyp 11/18/2017   History of hyperparathyroidism 01/08/2017   Hypothyroidism (acquired) 12/15/2016   Mixed hyperlipidemia 12/15/2016    PCP: Willow Ora,  MD  REFERRING PROVIDER: Juanda Buhrman, NP  REFERRING DIAG:  Diagnosis  M54.50,G89.29 (ICD-10-CM) - Chronic bilateral low back pain without sciatica  M79.18 (ICD-10-CM) - Myofascial pain syndrome    Rationale for Evaluation and Treatment: Rehabilitation  THERAPY DIAG:  Chronic bilateral low back pain without sciatica  Muscle weakness (generalized)  Myofascial pain  ONSET DATE: > 5 months  SUBJECTIVE:  SUBJECTIVE STATEMENT: No  pain today. No soreness either.    EVAL:Pt reports her back pain has been ongoing for a few months. Pt reports R side more tight than left side. Pt reports worse at night than during the day. Pt reports some intensive cramping like pain that limited walking but after trigger point myofascial injection. Pt takes gabapentin and tramadol for loss of sensation in BLE due to COVID. Pt is not diabetic with no history of Peripheral neuropathy.  Normal day in the life: Pt reports taking her dog for a walk, spends time watching various TV shows. Pt reports going out for walks during the warmer months. Pt reports cooking causes increased back pain.   PERTINENT HISTORY:    PAIN:  Are you having pain? Yes: NPRS scale: 0/10 in 24-hours 4/10 Pain location: Low Back Pain description: dull, achy pain.  Aggravating factors: Vacuuming,daily activities Relieving factors: Ice, rest , heat, theragun.  06/30/23: 2/10 Pain reported PRECAUTIONS: None  RED FLAGS: None   WEIGHT BEARING RESTRICTIONS: No  FALLS:  Has patient fallen in last 6 months? No   PATIENT GOALS: "get out of pain"   OBJECTIVE:  Note: Objective measures were completed at Evaluation unless otherwise noted.  DIAGNOSTIC FINDINGS:   EXAM: MRI LUMBAR SPINE WITHOUT CONTRAST   TECHNIQUE: Multiplanar,  multisequence MR imaging of the lumbar spine was performed. No intravenous contrast was administered.   COMPARISON:  Lumbar MRI 02/13/2019. Thoracic MRI 12/06/2019. Radiographs 02/03/2019.   FINDINGS: Segmentation: Conventional anatomy assumed, with the last open disc space designated L5-S1.Concordant with prior imaging.   Alignment:  Physiologic.   Vertebrae: No worrisome osseous lesion, acute fracture or pars defect. The visualized sacroiliac joints appear unremarkable.   Conus medullaris: Extends to the L1 level. The conus and cauda equina appear normal.   Paraspinal and other soft tissues: No significant paraspinal findings.   Disc levels:   Sagittal images demonstrate a chronic central disc extrusion with cephalad extension at T11-12, unchanged from previous MRI. No significant disc space findings at T12-L1.   L1-2: Mild loss of disc height with mild disc bulging and anterior osteophytes. No spinal stenosis or nerve root encroachment.   L2-3: Preserved disc height with mild disc bulging and mild facet hypertrophy. No spinal stenosis or nerve root encroachment.   L3-4: Preserved disc height with mild disc bulging and mild facet hypertrophy. No spinal stenosis or nerve root encroachment.   L4-5: Mildly progressive loss of disc height with mild disc bulging, facet and ligamentous hypertrophy. There is a shallow central disc protrusion contributing to mild triangulation of the thecal sac and mild lateral recess narrowing bilaterally. The foramina remain sufficiently patent.   L5-S1: Disc height and hydration are maintained. Stable moderate facet hypertrophy without resulting spinal stenosis or nerve root encroachment.   IMPRESSION: 1. 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 2. Stable moderate facet hypertrophy at L5-S1 without  resulting spinal stenosis or nerve root encroachment. 3. No acute findings or clear explanation for the patient's symptoms. 4. 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  PATIENT SURVEYS:  Modified Oswestry 14/50 = 28%   COGNITION: Overall cognitive status: Within functional limits for tasks assessed     SENSATION: Impaired bilateral light touch sensation form Tibia distally.;    POSTURE: increased lumbar lordosis and increased thoracic kyphosis  PALPATION: Tender to palpate bilateral lumbar  paraspinals and R PSIS.   LUMBAR ROM:   AROM eval  Flexion 70% and painful  Extension 70% and painful  Right lateral flexion 70% and painful  Left lateral flexion 70% and painful  Right rotation 70% and painful  Left rotation 70% and painful   (Blank rows = not tested)  LOWER EXTREMITY ROM:     Active  Right eval Left eval  Hip flexion    Hip extension    Hip abduction    Hip adduction    Hip internal rotation    Hip external rotation    Knee flexion    Knee extension    Ankle dorsiflexion    Ankle plantarflexion    Ankle inversion    Ankle eversion     (Blank rows = not tested)  LOWER EXTREMITY MMT:    MMT Right eval Left eval  Hip flexion 3+ 3+  Hip extension 3+ 3+  Hip abduction 3+ 3+  Hip adduction    Hip internal rotation    Hip external rotation    Knee flexion 3+ 3+  Knee extension    Ankle dorsiflexion    Ankle plantarflexion    Ankle inversion    Ankle eversion     (Blank rows = not tested)  LUMBAR SPECIAL TESTS:  Slump test: Positive  FUNCTIONAL TESTS:  30 seconds chair stand test: 7x 2 minute walk test: 265ft  GAIT: Distance walked: 231ft Assistive device utilized: None Level of assistance: Complete Independence Comments: Antalgic like gait pattern with excessive lateral trunk shift during ipsilateral stance phase with increased lumbar lordosis.   TREATMENT DATE:  07/11/2023  -Nustep  lvl 7 for 6x' -Blue TB side stepping 2 x 13ft -Standing hip ext. Bilat, blue TB at knees at // bars, 2x10. Verbal cues for ecc control, and upright posture with trunk --Standing hip abd. Bilat, blue TB at knees at // bars, x10 -RDL w/ 3 lb. Bar, v cues for soft knees, and posture. 15x -Pallof press, red TB, 10x bilat.v cues for core engagement and posture to limit pain t/o -Shoulder ext w/ red TB, 10 x. V cues for core and rhomboid engagement  07/07/2023  Therapeutic Exercise: -Nu-step, seat 7, level 4, 6' -Supine bridge + march, 2 sets of 10 reps -GTB side steps, 2 laps, 20 feet -Childs pose to prone press up 1 set of 7 reps  Neuromuscular Re-education: -Supine LTR, 1 minute, pt cued for decreased ROM due to pain levels -Supine PPT, 2 set of 10 reps, 3 second holds -Quadruped LE lift offs, 1 set of 10 reps bilaterally  Therapeutic Activity: -Tidal wave lifts off of floor to counter, 5lbs, 2 sets of 5 reps, pt cued for decreased posterior pelvic tilt/lumbar flexion   07/05/2023  Therapeutic Exercise: -Nu-step, seat 7, level 2, 5', pt cued for 70 spm -Quadruped, alternating LE liftoffs, 1 set of 8 reps -Quadruped, UE and LE liftoffs, 1 set of 8 reps -Fwd lunge with one UE support on chair, 1 sets of 10 reps, pt cued for upright posture and neutral spine -3 way hip, bilateral, 1 sets of 5 reps, pt reports pain with side and back  -Standing Red physioball core pushdowns (fwd, left, right) 1 set of 7 reps each direction  Neuromuscular Re-education: Supune LTR, 1 minute, pt cued for decreased ROM due to pain levels Supine PPT, 2 set of 10 reps, 3 second holds   PATIENT EDUCATION:  Education details: PT Evaluation, findings, prognosis, frequency, attendance policy, and  HEP if given. Pt was also educated on pain management strategies at counter top by elevating one LE on stool for decreased symptoms during cooking.  Person educated: Patient Education method: Software engineer Education comprehension: verbalized understanding  HOME EXERCISE PROGRAM: Access Code: [facility] URL: [email or web address] Date: 06/28/2023 Prepared by: AP - Rehab  Exercises - Hooklying Single Knee to Chest Stretch  - 2 x daily - 7 x weekly - 1 sets - 3 reps - 10 sec hold - Supine Lower Trunk Rotation  - 2 x daily - 7 x weekly - 1 sets - 10 reps - Supine Transversus Abdominis Bracing - Hands on Stomach  - 2 x daily - 7 x weekly - 1 sets - 10 reps - 5 sec hold - Seated Piriformis Stretch with Trunk Bend  - 2 x daily - 7 x weekly - 1 sets - 3 reps - 10 sec hold - Seated Hamstring Stretch  - 2 x daily - 7 x weekly - 1 sets - 3 reps - 10 sec hold - Seated Scapular Retraction  - 2 x daily - 7 x weekly - 1 sets - 10 reps - 5 sec hold   ASSESSMENT:  CLINICAL IMPRESSION: Patient tolerates session well. Reports overall decreased pain since beginning PT. Reports no low back pain at start of session. Today's session focused on strengthening of posterior chain, and core musculature with no reports of increased pain. With new exercises during today's session, patient experienced a mild increase in left sided back pain which improved with verbal cueing for posture. Patient continues to display altered gait pattern secondary to kyphotic posture and chronic peripheral neuropathy in which she would continue to benefit from skilled physical therapy to increase pain free lumbar ROM, improve lifting mechanics and increase LE strength for improved quality of life, and continued progress towards therapy goals.     OBJECTIVE IMPAIRMENTS: Abnormal gait, decreased activity tolerance, decreased balance, decreased mobility, difficulty walking, decreased ROM, decreased strength, hypomobility, postural dysfunction, and pain.   ACTIVITY LIMITATIONS: carrying, lifting, bending, standing, squatting, sleeping, transfers, and locomotion level  PARTICIPATION LIMITATIONS: cleaning, laundry,  community activity, occupation, and yard work  PERSONAL FACTORS: Age are also affecting patient's functional outcome.   REHAB POTENTIAL: Good  CLINICAL DECISION MAKING: Stable/uncomplicated  EVALUATION COMPLEXITY: Low   GOALS: Goals reviewed with patient? No  SHORT TERM GOALS: Target date: 07/11/2023  Pt will be independent with HEP in order to demonstrate participation in Physical Therapy POC.  Baseline: Goal status: in progress  2.  Pt will report 2/10 pain with mobility in order to demonstrate improved pain with functional activities.  Baseline:  Goal status: in progress  LONG TERM GOALS: Target date: 08/01/2023  Pt will improve 30 Second Chair Stand test by /> 2 in order to demonstrate improved functional strength to return to desired activities.  Baseline: See objective.  Goal status: in progress  2.  Pt will improve 2 MWT by 145ft in order to demonstrate improved functional ambulatory capacity in community setting.  Baseline: See objective.  Goal status: in progress  3.  Pt will improve Modified Oswestry score by 10 points in order to demonstrate improved pain with functional goals and outcomes. Baseline: See objective.  Goal status: in progress  4.  Pt will report 0/10 pain with mobility in order to demonstrate reduced pain with overall functional status.  Baseline: See objective.  Goal status: in progress   PLAN:  PT FREQUENCY: 2x/week  PT DURATION: 6 weeks  PLANNED INTERVENTIONS: 97164- PT Re-evaluation, 97110-Therapeutic exercises, 97530- Therapeutic activity, [id number]- Neuromuscular re-education, [phone]- Self Care, 60454- Manual therapy, [phone]- Gait training, [phone]- Electrical stimulation (unattended), [phone]- Electrical stimulation (manual), Patient/Family education, Balance training, Stair training, Taping, Dry Needling, Joint mobilization, Joint manipulation, Spinal manipulation, Spinal mobilization, Cryotherapy, and Moist heat.  PLAN FOR NEXT SESSION:  Progress lifting activities with education on proper form. Also plan to educate pt on performing PPT and APT before leaving the supine position to avoid normal pain of weight bearing.  Nelida Meuse PT, DPT [facility] [place] [phone] office

## 2023-07-14 ENCOUNTER — Encounter (HOSPITAL_COMMUNITY): Payer: Medicare Other

## 2023-07-18 ENCOUNTER — Ambulatory Visit (HOSPITAL_COMMUNITY): Payer: Medicare Other

## 2023-07-18 ENCOUNTER — Encounter (HOSPITAL_COMMUNITY): Payer: Self-pay

## 2023-07-18 DIAGNOSIS — G8929 Other chronic pain: Secondary | ICD-10-CM

## 2023-07-18 DIAGNOSIS — M6281 Muscle weakness (generalized): Secondary | ICD-10-CM

## 2023-07-18 DIAGNOSIS — M545 Low back pain, unspecified: Secondary | ICD-10-CM | POA: Diagnosis not present

## 2023-07-18 DIAGNOSIS — M7918 Myalgia, other site: Secondary | ICD-10-CM | POA: Diagnosis not present

## 2023-07-18 NOTE — Therapy (Signed)
 OUTPATIENT PHYSICAL THERAPY THORACOLUMBAR TREATMENT Progress Note Reporting Period 06/20/2023 to 07/18/2023  See note below for Objective Data and Assessment of Progress/Goals.   PHYSICAL THERAPY DISCHARGE SUMMARY  Visits from Start of Care: 7  Current functional level related to goals / functional outcomes: See below   Remaining deficits: See below   Education / Equipment: HEP   Patient agrees to discharge. Patient goals were partially met. Patient is being discharged due to being pleased with the current functional level.       Patient Name: Latasha Mclaughlin MRN: [id number] DOB:Nov 14, 1945, 78 y.o., female Today's Date: 07/18/2023  END OF SESSION:  PT End of Session - 07/18/23 1513     Visit Number 7    Number of Visits 12    Date for PT Re-Evaluation 08/01/23    Authorization Type Medicare part A & B    Authorization Time Period no auth no limit    Progress Note Due on Visit 7    PT Start Time 1432    PT Stop Time 1452    PT Time Calculation (min) 20 min    Activity Tolerance Patient tolerated treatment well    Behavior During Therapy [facility] for tasks assessed/performed                  Past Medical History:  Diagnosis Date   Allergy    SEASONAL   Arthritis    Back pain    GERD (gastroesophageal reflux disease)    Hiatal hernia 03/16/2019   History of tonsillectomy    Hyperlipidemia 12/15/2016   Hypothyroidism 12/15/2016   Osteopenia after menopause 03/22/2019   dexa 03/2019: T=-2.1 hip; Lspine excluded.    Osteoporosis    Sinus infection    Past Surgical History:  Procedure Laterality Date   ADENOIDECTOMY     ANTERIOR CERVICAL DECOMP/DISCECTOMY FUSION  2002   COLONOSCOPY  10/06/2018   DILATION AND CURETTAGE OF UTERUS     X2   LEFT OOPHORECTOMY     PARATHYROIDECTOMY     POLYPECTOMY     SHOULDER SURGERY     TONSILLECTOMY     Patient Active Problem List   Diagnosis Date Noted   IFG (impaired fasting glucose) 04/21/2023   Adenomatous colon  polyp 04/21/2023   Idiopathic small fiber sensory neuropathy 07/18/2020   White coat syndrome without diagnosis of hypertension 04/09/2020   Neuropathic pain 09/12/2019   Osteopenia after menopause 03/22/2019   GERD (gastroesophageal reflux disease) 03/16/2019   Hiatal hernia 03/16/2019   Vitamin B12 deficiency 10/20/2018   Colon polyp 11/18/2017   History of hyperparathyroidism 01/08/2017   Hypothyroidism (acquired) 12/15/2016   Mixed hyperlipidemia 12/15/2016    PCP: Willow Ora, MD  REFERRING PROVIDER: Juanda Constantine, NP  REFERRING DIAG:  Diagnosis  M54.50,G89.29 (ICD-10-CM) - Chronic bilateral low back pain without sciatica  M79.18 (ICD-10-CM) - Myofascial pain syndrome    Rationale for Evaluation and Treatment: Rehabilitation  THERAPY DIAG:  Chronic bilateral low back pain without sciatica  Muscle weakness (generalized)  Myofascial pain  ONSET DATE: > 5 months  SUBJECTIVE:  SUBJECTIVE STATEMENT: No  pain today. Pt reporting she is ready to graduate from PT. Pt reports no issues with HEP.    EVAL:Pt reports her back pain has been ongoing for a few months. Pt reports R side more tight than left side. Pt reports worse at night than during the day. Pt reports some intensive cramping like pain that limited walking but after trigger point myofascial injection. Pt takes gabapentin and tramadol for loss of sensation in BLE due to COVID. Pt is not diabetic with no history of Peripheral neuropathy.  Normal day in the life: Pt reports taking her dog for a walk, spends time watching various TV shows. Pt reports going out for walks during the warmer months. Pt reports cooking causes increased back pain.   PERTINENT HISTORY:    PAIN:  Are you having pain? Yes: NPRS scale: 0/10 in  24-hours 4/10 Pain location: Low Back Pain description: dull, achy pain.  Aggravating factors: Vacuuming,daily activities Relieving factors: Ice, rest , heat, theragun.  06/30/23: 2/10 Pain reported PRECAUTIONS: None  RED FLAGS: None   WEIGHT BEARING RESTRICTIONS: No  FALLS:  Has patient fallen in last 6 months? No   PATIENT GOALS: "get out of pain"   OBJECTIVE:  Note: Objective measures were completed at Evaluation unless otherwise noted.  DIAGNOSTIC FINDINGS:   EXAM: MRI LUMBAR SPINE WITHOUT CONTRAST   TECHNIQUE: Multiplanar, multisequence MR imaging of the lumbar spine was performed. No intravenous contrast was administered.   COMPARISON:  Lumbar MRI 02/13/2019. Thoracic MRI 12/06/2019. Radiographs 02/03/2019.   FINDINGS: Segmentation: Conventional anatomy assumed, with the last open disc space designated L5-S1.Concordant with prior imaging.   Alignment:  Physiologic.   Vertebrae: No worrisome osseous lesion, acute fracture or pars defect. The visualized sacroiliac joints appear unremarkable.   Conus medullaris: Extends to the L1 level. The conus and cauda equina appear normal.   Paraspinal and other soft tissues: No significant paraspinal findings.   Disc levels:   Sagittal images demonstrate a chronic central disc extrusion with cephalad extension at T11-12, unchanged from previous MRI. No significant disc space findings at T12-L1.   L1-2: Mild loss of disc height with mild disc bulging and anterior osteophytes. No spinal stenosis or nerve root encroachment.   L2-3: Preserved disc height with mild disc bulging and mild facet hypertrophy. No spinal stenosis or nerve root encroachment.   L3-4: Preserved disc height with mild disc bulging and mild facet hypertrophy. No spinal stenosis or nerve root encroachment.   L4-5: Mildly progressive loss of disc height with mild disc bulging, facet and ligamentous hypertrophy. There is a shallow central  disc protrusion contributing to mild triangulation of the thecal sac and mild lateral recess narrowing bilaterally. The foramina remain sufficiently patent.   L5-S1: Disc height and hydration are maintained. Stable moderate facet hypertrophy without resulting spinal stenosis or nerve root encroachment.   IMPRESSION: 1. 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 2. Stable moderate facet hypertrophy at L5-S1 without resulting spinal stenosis or nerve root encroachment. 3. No acute findings or clear explanation for the patient's symptoms. 4. 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  PATIENT SURVEYS:  Modified Oswestry 14/50 = 28%   COGNITION: Overall cognitive status: Within functional limits for tasks assessed     SENSATION: Impaired bilateral light touch sensation form Tibia distally.;    POSTURE: increased lumbar  lordosis and increased thoracic kyphosis  PALPATION: Tender to palpate bilateral lumbar paraspinals and R PSIS.   LUMBAR ROM:   AROM eval 07/18/2023  Flexion 70% and painful 85%  Extension 70% and painful 75% and painful  Right lateral flexion 70% and painful 80%  Left lateral flexion 70% and painful 80%  Right rotation 70% and painful 80%  Left rotation 70% and painful 80%   (Blank rows = not tested)  LOWER EXTREMITY ROM:     Active  Right eval Left eval  Hip flexion    Hip extension    Hip abduction    Hip adduction    Hip internal rotation    Hip external rotation    Knee flexion    Knee extension    Ankle dorsiflexion    Ankle plantarflexion    Ankle inversion    Ankle eversion     (Blank rows = not tested)  LOWER EXTREMITY MMT:    MMT Right eval Left eval 07/18/2023 Right 07/18/2023  Left  Hip flexion 3+ 3+ 4- 4-  Hip extension 3+ 3+ 4- 4  Hip  abduction 3+ 3+ 4- 4-  Hip adduction      Hip internal rotation      Hip external rotation      Knee flexion 3+ 3+    Knee extension      Ankle dorsiflexion      Ankle plantarflexion      Ankle inversion      Ankle eversion       (Blank rows = not tested)  LUMBAR SPECIAL TESTS:  Slump test: Positive  FUNCTIONAL TESTS:  30 seconds chair stand test: 7x 2 minute walk test: 271ft  GAIT: Distance walked: 268ft Assistive device utilized: None Level of assistance: Complete Independence Comments: Antalgic like gait pattern with excessive lateral trunk shift during ipsilateral stance phase with increased lumbar lordosis.   TREATMENT DATE:  07/18/2023  -Modified Oswestry: 2/50 = 4% -30 Second Chair Stand Test: 10x-  -ROM -MMT - : 336ft  07/11/2023  -Nustep lvl 7 for 6x' -Blue TB side stepping 2 x 75ft -Standing hip ext. Bilat, blue TB at knees at // bars, 2x10. Verbal cues for ecc control, and upright posture with trunk --Standing hip abd. Bilat, blue TB at knees at // bars, x10 -RDL w/ 3 lb. Bar, v cues for soft knees, and posture. 15x -Pallof press, red TB, 10x bilat.v cues for core engagement and posture to limit pain t/o -Shoulder ext w/ red TB, 10 x. V cues for core and rhomboid engagement  07/07/2023  Therapeutic Exercise: -Nu-step, seat 7, level 4, 6' -Supine bridge + march, 2 sets of 10 reps -GTB side steps, 2 laps, 20 feet -Childs pose to prone press up 1 set of 7 reps  Neuromuscular Re-education: -Supine LTR, 1 minute, pt cued for decreased ROM due to pain levels -Supine PPT, 2 set of 10 reps, 3 second holds -Quadruped LE lift offs, 1 set of 10 reps bilaterally  Therapeutic Activity: -Tidal wave lifts off of floor to counter, 5lbs, 2 sets of 5 reps, pt cued for decreased posterior pelvic tilt/lumbar flexion    PATIENT EDUCATION:  Education details: PT Evaluation, findings, prognosis, frequency, attendance policy, and HEP if given. Pt was also educated on  pain management strategies at counter top by elevating one LE on stool for decreased symptoms during cooking.  Person educated: Patient Education method: Medical illustrator Education comprehension: verbalized understanding  HOME EXERCISE PROGRAM: Access Code:  [facility] URL: [email or web address] Date: 06/28/2023 Prepared by: [facility] - Rehab  Exercises - Hooklying Single Knee to Chest Stretch  - 2 x daily - 7 x weekly - 1 sets - 3 reps - 10 sec hold - Supine Lower Trunk Rotation  - 2 x daily - 7 x weekly - 1 sets - 10 reps - Supine Transversus Abdominis Bracing - Hands on Stomach  - 2 x daily - 7 x weekly - 1 sets - 10 reps - 5 sec hold - Seated Piriformis Stretch with Trunk Bend  - 2 x daily - 7 x weekly - 1 sets - 3 reps - 10 sec hold - Seated Hamstring Stretch  - 2 x daily - 7 x weekly - 1 sets - 3 reps - 10 sec hold - Seated Scapular Retraction  - 2 x daily - 7 x weekly - 1 sets - 10 reps - 5 sec hold  Access Code: [email or web address] URL: [email or web address] Date: 07/18/2023 Prepared by: Starling Manns  Exercises - Side Stepping with Resistance at Ankles  - 1 x daily - 7 x weekly - 3 sets - 10 reps - Single Leg Stance  - 1 x daily - 7 x weekly - 3 sets - 10 reps - Standing Anti-Rotation Press with Anchored Resistance  - 1 x daily - 7 x weekly - 3 sets - 10 reps  ASSESSMENT:  CLINICAL IMPRESSION: Progress note performed today at patient request as she has been noting significant improvements with pain and feels that she is ready for discharge.  Upon reassessment, patient showing significant changes in perceived pain and disability, manual muscle testing, and functional transfer/fall risk capacity.  Patient continues to show abnormal posturing, however at this point, does not seem to limit patient's pain and function.  Since patient has noticed such improvements in pain, function, and strength, patient to be discharged at this point.     OBJECTIVE  IMPAIRMENTS: Abnormal gait, decreased activity tolerance, decreased balance, decreased mobility, difficulty walking, decreased ROM, decreased strength, hypomobility, postural dysfunction, and pain.   ACTIVITY LIMITATIONS: carrying, lifting, bending, standing, squatting, sleeping, transfers, and locomotion level  PARTICIPATION LIMITATIONS: cleaning, laundry, community activity, occupation, and yard work  PERSONAL FACTORS: Age are also affecting patient's functional outcome.   REHAB POTENTIAL: Good  CLINICAL DECISION MAKING: Stable/uncomplicated  EVALUATION COMPLEXITY: Low   GOALS: Goals reviewed with patient? No  SHORT TERM GOALS: Target date: 07/11/2023  Pt will be independent with HEP in order to demonstrate participation in Physical Therapy POC.  Baseline: Goal status:MET  2.  Pt will report 2/10 pain with mobility in order to demonstrate improved pain with functional activities.  Baseline:  Goal status: MET  LONG TERM GOALS: Target date: 08/01/2023  Pt will improve 30 Second Chair Stand test by /> 2 in order to demonstrate improved functional strength to return to desired activities.  Baseline: See objective.  Goal status: MET  2.  Pt will improve 2 MWT by 125ft in order to demonstrate improved functional ambulatory capacity in community setting.  Baseline: See objective.  Goal status:NOT MET  3.  Pt will improve Modified Oswestry score by 10 points in order to demonstrate improved pain with functional goals and outcomes. Baseline: See objective.  Goal status:MET  4.  Pt will report 0/10 pain with mobility in order to demonstrate reduced pain with overall functional status.  Baseline: See objective.  Goal status:MET  PLAN:  PT FREQUENCY: 2x/week  PT DURATION: 6 weeks  PLANNED INTERVENTIONS: 08657- PT Re-evaluation,  97110-Therapeutic exercises, 97530- Therapeutic activity, [id number]- Neuromuscular re-education, [phone]- Self Care, 62952- Manual therapy, [phone]- Gait  training, [phone]- Electrical stimulation (unattended), [phone]- Electrical stimulation (manual), Patient/Family education, Balance training, Stair training, Taping, Dry Needling, Joint mobilization, Joint manipulation, Spinal manipulation, Spinal mobilization, Cryotherapy, and Moist heat.  PLAN FOR NEXT SESSION: Discharged  Nelida Meuse PT, DPT [facility] [place] [phone] office

## 2023-07-21 ENCOUNTER — Encounter (HOSPITAL_COMMUNITY): Payer: Medicare Other | Admitting: Physical Therapy

## 2023-07-25 ENCOUNTER — Encounter (HOSPITAL_COMMUNITY): Payer: Medicare Other

## 2023-07-29 ENCOUNTER — Encounter (HOSPITAL_COMMUNITY): Payer: Medicare Other

## 2023-08-02 ENCOUNTER — Encounter (HOSPITAL_COMMUNITY): Payer: Medicare Other | Admitting: Physical Therapy

## 2023-08-05 ENCOUNTER — Encounter (HOSPITAL_COMMUNITY): Payer: Medicare Other

## 2023-08-15 ENCOUNTER — Telehealth: Payer: Self-pay

## 2023-08-15 ENCOUNTER — Other Ambulatory Visit: Payer: Self-pay | Admitting: Family

## 2023-08-15 MED ORDER — TRAMADOL HCL 50 MG PO TABS: 50.0000 mg | ORAL_TABLET | Freq: Two times a day (BID) | ORAL | 1 refills | Status: DC

## 2023-08-15 NOTE — Telephone Encounter (Signed)
 Copied from CRM [id number]. Topic: Clinical - Prescription Issue >> Aug 15, 2023  9:52 AM Clydene Darner H wrote: Reason for CRM: Odilia Bennett from [facility] called regarding the patient's Tramadol prescription. She stated the prescription needs to be signed electronically. Callback number [phone]  Please Advise  Message has been sent to provider to address

## 2023-08-16 ENCOUNTER — Encounter: Payer: Self-pay | Admitting: Family Medicine

## 2023-08-16 ENCOUNTER — Other Ambulatory Visit: Payer: Self-pay | Admitting: Family

## 2023-08-16 MED ORDER — ROSUVASTATIN CALCIUM 10 MG PO TABS: 10.0000 mg | ORAL_TABLET | Freq: Every day | ORAL | 1 refills | Status: DC

## 2023-08-24 ENCOUNTER — Encounter (INDEPENDENT_AMBULATORY_CARE_PROVIDER_SITE_OTHER): Payer: Self-pay

## 2023-08-24 ENCOUNTER — Ambulatory Visit (INDEPENDENT_AMBULATORY_CARE_PROVIDER_SITE_OTHER): Admitting: Internal Medicine

## 2023-08-24 ENCOUNTER — Encounter: Payer: Self-pay | Admitting: Internal Medicine

## 2023-08-24 VITALS — BP 148/85 | HR 66 | Temp 97.8°F | Ht 65.5 in | Wt 177.1 lb

## 2023-08-24 DIAGNOSIS — R1013 Epigastric pain: Secondary | ICD-10-CM

## 2023-08-24 DIAGNOSIS — Z860101 Personal history of adenomatous and serrated colon polyps: Secondary | ICD-10-CM

## 2023-08-24 DIAGNOSIS — R1310 Dysphagia, unspecified: Secondary | ICD-10-CM

## 2023-08-24 DIAGNOSIS — K449 Diaphragmatic hernia without obstruction or gangrene: Secondary | ICD-10-CM

## 2023-08-24 DIAGNOSIS — Z860100 Personal history of colon polyps, unspecified: Secondary | ICD-10-CM

## 2023-08-24 DIAGNOSIS — R1319 Other dysphagia: Secondary | ICD-10-CM

## 2023-08-24 DIAGNOSIS — G8929 Other chronic pain: Secondary | ICD-10-CM

## 2023-08-24 DIAGNOSIS — K219 Gastro-esophageal reflux disease without esophagitis: Secondary | ICD-10-CM | POA: Diagnosis not present

## 2023-08-24 MED ORDER — OMEPRAZOLE 20 MG PO CPDR: 20.0000 mg | DELAYED_RELEASE_CAPSULE | Freq: Every day | ORAL | 3 refills | Status: AC

## 2023-08-24 NOTE — H&P (View-Only) (Signed)
 Primary Care Physician:  Luevenia Saha, MD Primary Gastroenterologist:  Dr. Mordechai April  Chief Complaint  Patient presents with   Abdominal Pain    Patient here today due to having issues with a mid upper abdomen burning sensation she says she is in need of a TCS. She was taking Omeprazole  20 mg daily, but has since ran out. She says this medication was helping her when she was taking it.     HPI:   Latasha Mclaughlin is a 78 y.o. female who presents to clinic today as a new patient.  Previously followed by [facility], last seen 08/20/2020.  Wishes to establish care with us  due to living in [place] currently.  Epigastric pain, GERD: Chronic, intermittent, mild to moderate. Does not radiate.  Previously well-controlled omeprazole  daily, states she ran out of this medication.  Occasionally will take gabapentin  and tramadol  for breakthrough symptoms.  Reports associated nausea without vomiting, primarily in the AM.  CT of the abdomen pelvis in October 2020 showed a normal-appearing gallbladder moderate sized hiatal hernia and was otherwise unremarkable.   EGD 11/01/2018 with 6 cm hiatal hernia, distal stricture not dilated.  Esophageal dysphagia: Intermittent symptoms of feeling of food getting stuck in her substernal region.  Stricture noted on previous EGD as above.  History of adenomatous colon polyps: Colonoscopy 10/06/2018 medium sized lipoma in the ascending colon, 2 tubular adenomas removed.  Due for surveillance colonoscopy now.  No family history of colorectal malignancy.  No melena hematochezia.  No unintentional weight loss.  Past Medical History:  Diagnosis Date   Allergy    SEASONAL   Arthritis    Back pain    GERD (gastroesophageal reflux disease)    Hiatal hernia 03/16/2019   History of tonsillectomy    Hyperlipidemia 12/15/2016   Hypothyroidism 12/15/2016   Osteopenia after menopause 03/22/2019   dexa 03/2019: T=-2.1 hip; Lspine excluded.    Osteoporosis    Sinus  infection     Past Surgical History:  Procedure Laterality Date   ADENOIDECTOMY     ANTERIOR CERVICAL DECOMP/DISCECTOMY FUSION  2002   COLONOSCOPY  10/06/2018   DILATION AND CURETTAGE OF UTERUS     X2   LEFT OOPHORECTOMY     PARATHYROIDECTOMY     POLYPECTOMY     SHOULDER SURGERY     TONSILLECTOMY      Current Outpatient Medications  Medication Sig Dispense Refill   Cholecalciferol (VITAMIN D ) 50 MCG (2000 UT) tablet Take 2,000 Units by mouth daily.     cyanocobalamin  (VITAMIN B12) 1000 MCG/ML injection ADMINISTER 1 ML(1000 MCG) IN THE MUSCLE EVERY 30 DAYS 1 mL 3   gabapentin  (NEURONTIN ) 300 MG capsule TAKE 1 CAPSULE(300 MG) BY MOUTH THREE TIMES DAILY 270 capsule 5   rosuvastatin  (CRESTOR ) 10 MG tablet Take 1 tablet (10 mg total) by mouth daily. 90 tablet 1   SYNTHROID  88 MCG tablet Take 1 tablet (88 mcg total) by mouth daily before breakfast. 90 tablet 3   SYRINGE-NEEDLE, DISP, 3 ML (BD INTEGRA SYRINGE) 25G X 1" 3 ML MISC USE ONCE MONTHLY 12 each 1   traMADol  (ULTRAM ) 50 MG tablet Take 1 tablet (50 mg total) by mouth 2 (two) times daily. 180 tablet 1   No current facility-administered medications for this visit.    Allergies as of 08/24/2023 - Review Complete 08/24/2023  Allergen Reaction Noted   Prevacid [lansoprazole] Rash 12/15/2016   Doxycycline Other (See Comments) 08/01/2018   Duloxetine hcl Other (See Comments)  08/03/2022   Pregabalin Other (See Comments) 08/03/2022   Ciprofloxacin Palpitations 08/03/2022   Meloxicam Palpitations 08/03/2022    Family History  Problem Relation Age of Onset   Hyperlipidemia Mother    Hypertension Mother    Congestive Heart Failure Mother    Heart disease Mother    Cancer Father    Diabetes Father    Heart disease Father    Hyperlipidemia Father    Colon polyps Father    Thyroid  disease Sister    Colon polyps Sister    Drug abuse Brother    Diabetes Son    Colon cancer Neg Hx    Esophageal cancer Neg Hx    Stomach cancer  Neg Hx    Rectal cancer Neg Hx     Social History   Socioeconomic History   Marital status: Married    Spouse name: Not on file   Number of children: 3   Years of education: 12   Highest education level: GED or equivalent  Occupational History   Occupation: retired  Tobacco Use   Smoking status: Former   Smokeless tobacco: Never   Tobacco comments:    Quit in 1985  Vaping Use   Vaping status: Never Used  Substance and Sexual Activity   Alcohol use: No   Drug use: No   Sexual activity: Yes  Other Topics Concern   Not on file  Social History Narrative   Lives with husband      Lives in two story home      Right handed      Highest level of edu- GED      Retired      Previously lived in [place]    Social Drivers of Health   Financial Resource Strain: Low Risk  (04/17/2023)   Overall Financial Resource Strain (CARDIA)    Difficulty of Paying Living Expenses: Not hard at all  Food Insecurity: No Food Insecurity (04/17/2023)   Hunger Vital Sign    Worried About Running Out of Food in the Last Year: Never true    Ran Out of Food in the Last Year: Never true  Transportation Needs: No Transportation Needs (04/17/2023)   PRAPARE - Administrator, Civil Service (Medical): No    Lack of Transportation (Non-Medical): No  Physical Activity: Insufficiently Active (04/17/2023)   Exercise Vital Sign    Days of Exercise per Week: 3 days    Minutes of Exercise per Session: 20 min  Stress: No Stress Concern Present (04/17/2023)   [employer] of Occupational Health - Occupational Stress Questionnaire    Feeling of Stress : Only a little  Social Connections: Moderately Integrated (04/17/2023)   Social Connection and Isolation Panel [NHANES]    Frequency of Communication with Friends and Family: More than three times a week    Frequency of Social Gatherings with Friends and Family: Twice a week    Attends Religious Services: More than 4 times per year     Active Member of [employer] or Organizations: No    Attends Banker Meetings: Not on file    Marital Status: Married  Catering manager Violence: Not At Risk (08/03/2022)   Humiliation, Afraid, Rape, and Kick questionnaire    Fear of Current or Ex-Partner: No    Emotionally Abused: No    Physically Abused: No    Sexually Abused: No    Subjective: Review of Systems  Constitutional:  Negative for chills and fever.  HENT:  Negative for congestion and hearing loss.   Eyes:  Negative for blurred vision and double vision.  Respiratory:  Negative for cough and shortness of breath.   Cardiovascular:  Negative for chest pain and palpitations.  Gastrointestinal:  Positive for abdominal pain and heartburn. Negative for blood in stool, constipation, diarrhea, melena and vomiting.       Dysphagia  Genitourinary:  Negative for dysuria and urgency.  Musculoskeletal:  Negative for joint pain and myalgias.  Skin:  Negative for itching and rash.  Neurological:  Negative for dizziness and headaches.  Psychiatric/Behavioral:  Negative for depression. The patient is not nervous/anxious.        Objective: BP (!) 171/77 (BP Location: Left Arm, Patient Position: Sitting, Cuff Size: Normal)   Pulse 70   Temp 97.8 F (36.6 C) (Temporal)   Ht 5' 5.5" (1.664 m)   Wt 177 lb 1.6 oz (80.3 kg)   BMI 29.02 kg/m  Physical Exam Constitutional:      Appearance: Normal appearance.  HENT:     Head: Normocephalic and atraumatic.  Eyes:     Extraocular Movements: Extraocular movements intact.     Conjunctiva/sclera: Conjunctivae normal.  Cardiovascular:     Rate and Rhythm: Normal rate and regular rhythm.  Pulmonary:     Effort: Pulmonary effort is normal.     Breath sounds: Normal breath sounds.  Abdominal:     General: Bowel sounds are normal.     Palpations: Abdomen is soft.  Musculoskeletal:        General: No swelling. Normal range of motion.     Cervical back: Normal range of motion and  neck supple.  Skin:    General: Skin is warm and dry.     Coloration: Skin is not jaundiced.  Neurological:     General: No focal deficit present.     Mental Status: She is alert and oriented to person, place, and time.  Psychiatric:        Mood and Affect: Mood normal.        Behavior: Behavior normal.      Assessment/Plan:  1.  Personal history of adenomatous colon polyps-will schedule for surveillance colonoscopy today. The risks including infection, bleed, or perforation as well as benefits, limitations, alternatives and imponderables have been reviewed with the patient. Questions have been answered. All parties agreeable.  2.  GERD, nausea,  epigastric pain, dysphagia-at same time as colonoscopy, will perform upper endoscopy with possible esophageal dilation.  Will restart PPI therapy daily.  Prescription for omeprazole  20 mg sent to pharmacy.  Will order ultrasound to evaluate for biliary colic.  Call with results.  Follow-up after procedures.  08/24/2023 9:13 AM   Disclaimer: This note was dictated with voice recognition software. Similar sounding words can inadvertently be transcribed and may not be corrected upon review.

## 2023-08-24 NOTE — Patient Instructions (Signed)
 We will schedule you for EGD and colonoscopy.  I may elect to stretch your esophagus depending on findings.  I am also going to order a right upper quadrant ultrasound to evaluate your gallbladder.  I have refilled your omeprazole  and sent this to your pharmacy.  It was very nice meeting you today.  Dr. Mordechai April

## 2023-08-24 NOTE — Progress Notes (Signed)
 Primary Care Physician:  Luevenia Saha, MD Primary Gastroenterologist:  Dr. Mordechai April  Chief Complaint  Patient presents with   Abdominal Pain    Patient here today due to having issues with a mid upper abdomen burning sensation she says she is in need of a TCS. She was taking Omeprazole  20 mg daily, but has since ran out. She says this medication was helping her when she was taking it.     HPI:   Latasha Mclaughlin is a 78 y.o. female who presents to clinic today as a new patient.  Previously followed by [facility], last seen 08/20/2020.  Wishes to establish care with us  due to living in [place] currently.  Epigastric pain, GERD: Chronic, intermittent, mild to moderate. Does not radiate.  Previously well-controlled omeprazole  daily, states she ran out of this medication.  Occasionally will take gabapentin  and tramadol  for breakthrough symptoms.  Reports associated nausea without vomiting, primarily in the AM.  CT of the abdomen pelvis in October 2020 showed a normal-appearing gallbladder moderate sized hiatal hernia and was otherwise unremarkable.   EGD 11/01/2018 with 6 cm hiatal hernia, distal stricture not dilated.  Esophageal dysphagia: Intermittent symptoms of feeling of food getting stuck in her substernal region.  Stricture noted on previous EGD as above.  History of adenomatous colon polyps: Colonoscopy 10/06/2018 medium sized lipoma in the ascending colon, 2 tubular adenomas removed.  Due for surveillance colonoscopy now.  No family history of colorectal malignancy.  No melena hematochezia.  No unintentional weight loss.  Past Medical History:  Diagnosis Date   Allergy    SEASONAL   Arthritis    Back pain    GERD (gastroesophageal reflux disease)    Hiatal hernia 03/16/2019   History of tonsillectomy    Hyperlipidemia 12/15/2016   Hypothyroidism 12/15/2016   Osteopenia after menopause 03/22/2019   dexa 03/2019: T=-2.1 hip; Lspine excluded.    Osteoporosis    Sinus  infection     Past Surgical History:  Procedure Laterality Date   ADENOIDECTOMY     ANTERIOR CERVICAL DECOMP/DISCECTOMY FUSION  2002   COLONOSCOPY  10/06/2018   DILATION AND CURETTAGE OF UTERUS     X2   LEFT OOPHORECTOMY     PARATHYROIDECTOMY     POLYPECTOMY     SHOULDER SURGERY     TONSILLECTOMY      Current Outpatient Medications  Medication Sig Dispense Refill   Cholecalciferol (VITAMIN D ) 50 MCG (2000 UT) tablet Take 2,000 Units by mouth daily.     cyanocobalamin  (VITAMIN B12) 1000 MCG/ML injection ADMINISTER 1 ML(1000 MCG) IN THE MUSCLE EVERY 30 DAYS 1 mL 3   gabapentin  (NEURONTIN ) 300 MG capsule TAKE 1 CAPSULE(300 MG) BY MOUTH THREE TIMES DAILY 270 capsule 5   rosuvastatin  (CRESTOR ) 10 MG tablet Take 1 tablet (10 mg total) by mouth daily. 90 tablet 1   SYNTHROID  88 MCG tablet Take 1 tablet (88 mcg total) by mouth daily before breakfast. 90 tablet 3   SYRINGE-NEEDLE, DISP, 3 ML (BD INTEGRA SYRINGE) 25G X 1" 3 ML MISC USE ONCE MONTHLY 12 each 1   traMADol  (ULTRAM ) 50 MG tablet Take 1 tablet (50 mg total) by mouth 2 (two) times daily. 180 tablet 1   No current facility-administered medications for this visit.    Allergies as of 08/24/2023 - Review Complete 08/24/2023  Allergen Reaction Noted   Prevacid [lansoprazole] Rash 12/15/2016   Doxycycline Other (See Comments) 08/01/2018   Duloxetine hcl Other (See Comments)  08/03/2022   Pregabalin Other (See Comments) 08/03/2022   Ciprofloxacin Palpitations 08/03/2022   Meloxicam Palpitations 08/03/2022    Family History  Problem Relation Age of Onset   Hyperlipidemia Mother    Hypertension Mother    Congestive Heart Failure Mother    Heart disease Mother    Cancer Father    Diabetes Father    Heart disease Father    Hyperlipidemia Father    Colon polyps Father    Thyroid  disease Sister    Colon polyps Sister    Drug abuse Brother    Diabetes Son    Colon cancer Neg Hx    Esophageal cancer Neg Hx    Stomach cancer  Neg Hx    Rectal cancer Neg Hx     Social History   Socioeconomic History   Marital status: Married    Spouse name: Not on file   Number of children: 3   Years of education: 12   Highest education level: GED or equivalent  Occupational History   Occupation: retired  Tobacco Use   Smoking status: Former   Smokeless tobacco: Never   Tobacco comments:    Quit in 1985  Vaping Use   Vaping status: Never Used  Substance and Sexual Activity   Alcohol use: No   Drug use: No   Sexual activity: Yes  Other Topics Concern   Not on file  Social History Narrative   Lives with husband      Lives in two story home      Right handed      Highest level of edu- GED      Retired      Previously lived in [place]    Social Drivers of Health   Financial Resource Strain: Low Risk  (04/17/2023)   Overall Financial Resource Strain (CARDIA)    Difficulty of Paying Living Expenses: Not hard at all  Food Insecurity: No Food Insecurity (04/17/2023)   Hunger Vital Sign    Worried About Running Out of Food in the Last Year: Never true    Ran Out of Food in the Last Year: Never true  Transportation Needs: No Transportation Needs (04/17/2023)   PRAPARE - Administrator, Civil Service (Medical): No    Lack of Transportation (Non-Medical): No  Physical Activity: Insufficiently Active (04/17/2023)   Exercise Vital Sign    Days of Exercise per Week: 3 days    Minutes of Exercise per Session: 20 min  Stress: No Stress Concern Present (04/17/2023)   [employer] of Occupational Health - Occupational Stress Questionnaire    Feeling of Stress : Only a little  Social Connections: Moderately Integrated (04/17/2023)   Social Connection and Isolation Panel [NHANES]    Frequency of Communication with Friends and Family: More than three times a week    Frequency of Social Gatherings with Friends and Family: Twice a week    Attends Religious Services: More than 4 times per year     Active Member of [employer] or Organizations: No    Attends Banker Meetings: Not on file    Marital Status: Married  Catering manager Violence: Not At Risk (08/03/2022)   Humiliation, Afraid, Rape, and Kick questionnaire    Fear of Current or Ex-Partner: No    Emotionally Abused: No    Physically Abused: No    Sexually Abused: No    Subjective: Review of Systems  Constitutional:  Negative for chills and fever.  HENT:  Negative for congestion and hearing loss.   Eyes:  Negative for blurred vision and double vision.  Respiratory:  Negative for cough and shortness of breath.   Cardiovascular:  Negative for chest pain and palpitations.  Gastrointestinal:  Positive for abdominal pain and heartburn. Negative for blood in stool, constipation, diarrhea, melena and vomiting.       Dysphagia  Genitourinary:  Negative for dysuria and urgency.  Musculoskeletal:  Negative for joint pain and myalgias.  Skin:  Negative for itching and rash.  Neurological:  Negative for dizziness and headaches.  Psychiatric/Behavioral:  Negative for depression. The patient is not nervous/anxious.        Objective: BP (!) 171/77 (BP Location: Left Arm, Patient Position: Sitting, Cuff Size: Normal)   Pulse 70   Temp 97.8 F (36.6 C) (Temporal)   Ht 5' 5.5" (1.664 m)   Wt 177 lb 1.6 oz (80.3 kg)   BMI 29.02 kg/m  Physical Exam Constitutional:      Appearance: Normal appearance.  HENT:     Head: Normocephalic and atraumatic.  Eyes:     Extraocular Movements: Extraocular movements intact.     Conjunctiva/sclera: Conjunctivae normal.  Cardiovascular:     Rate and Rhythm: Normal rate and regular rhythm.  Pulmonary:     Effort: Pulmonary effort is normal.     Breath sounds: Normal breath sounds.  Abdominal:     General: Bowel sounds are normal.     Palpations: Abdomen is soft.  Musculoskeletal:        General: No swelling. Normal range of motion.     Cervical back: Normal range of motion and  neck supple.  Skin:    General: Skin is warm and dry.     Coloration: Skin is not jaundiced.  Neurological:     General: No focal deficit present.     Mental Status: She is alert and oriented to person, place, and time.  Psychiatric:        Mood and Affect: Mood normal.        Behavior: Behavior normal.      Assessment/Plan:  1.  Personal history of adenomatous colon polyps-will schedule for surveillance colonoscopy today. The risks including infection, bleed, or perforation as well as benefits, limitations, alternatives and imponderables have been reviewed with the patient. Questions have been answered. All parties agreeable.  2.  GERD, nausea,  epigastric pain, dysphagia-at same time as colonoscopy, will perform upper endoscopy with possible esophageal dilation.  Will restart PPI therapy daily.  Prescription for omeprazole  20 mg sent to pharmacy.  Will order ultrasound to evaluate for biliary colic.  Call with results.  Follow-up after procedures.  08/24/2023 9:13 AM   Disclaimer: This note was dictated with voice recognition software. Similar sounding words can inadvertently be transcribed and may not be corrected upon review.

## 2023-09-06 ENCOUNTER — Ambulatory Visit (HOSPITAL_COMMUNITY)
Admission: RE | Admit: 2023-09-06 | Discharge: 2023-09-06 | Disposition: A | Discharge status: Home / Self Care | Source: Ambulatory Visit | Attending: Internal Medicine | Admitting: Internal Medicine

## 2023-09-06 DIAGNOSIS — K8020 Calculus of gallbladder without cholecystitis without obstruction: Secondary | ICD-10-CM | POA: Diagnosis not present

## 2023-09-06 DIAGNOSIS — K7689 Other specified diseases of liver: Secondary | ICD-10-CM | POA: Diagnosis not present

## 2023-09-06 DIAGNOSIS — R1013 Epigastric pain: Secondary | ICD-10-CM | POA: Insufficient documentation

## 2023-09-06 DIAGNOSIS — G8929 Other chronic pain: Secondary | ICD-10-CM | POA: Diagnosis not present

## 2023-09-12 ENCOUNTER — Encounter (HOSPITAL_COMMUNITY)
Admission: RE | Admit: 2023-09-12 | Discharge: 2023-09-12 | Disposition: A | Discharge status: Home / Self Care | Source: Ambulatory Visit | Attending: Internal Medicine | Admitting: Internal Medicine

## 2023-09-12 ENCOUNTER — Encounter (HOSPITAL_COMMUNITY): Payer: Self-pay

## 2023-09-16 ENCOUNTER — Other Ambulatory Visit: Payer: Self-pay | Admitting: Gastroenterology

## 2023-09-16 ENCOUNTER — Telehealth: Payer: Self-pay | Admitting: *Deleted

## 2023-09-16 MED ORDER — ONDANSETRON 4 MG PO TBDP: ORAL_TABLET | ORAL | 0 refills | Status: DC

## 2023-09-16 NOTE — Telephone Encounter (Signed)
 Pt called and stated that from past experience when she has to do the prep it causes her to have nausea. Is there anything she can take or that can be prescribed to help with the nausea? She is scheduled for Monday 09/19/23 with Dr.Carver. Please advise. Thank you.Aaron Aas   Pharmacy:  [facility] on [address] [place], [place]

## 2023-09-16 NOTE — Telephone Encounter (Signed)
 Pt informed of message and recommendations. Verbalized understanding

## 2023-09-19 ENCOUNTER — Ambulatory Visit (HOSPITAL_COMMUNITY)

## 2023-09-19 ENCOUNTER — Ambulatory Visit (HOSPITAL_COMMUNITY)
Admission: RE | Admit: 2023-09-19 | Discharge: 2023-09-19 | Disposition: A | Discharge status: Home / Self Care | Attending: Internal Medicine | Admitting: Internal Medicine

## 2023-09-19 ENCOUNTER — Encounter (HOSPITAL_COMMUNITY): Payer: Self-pay | Admitting: Internal Medicine

## 2023-09-19 ENCOUNTER — Encounter (HOSPITAL_COMMUNITY)
Admission: RE | Disposition: A | Discharge status: Home / Self Care | Payer: Self-pay | Source: Home / Self Care | Attending: Internal Medicine

## 2023-09-19 DIAGNOSIS — D1779 Benign lipomatous neoplasm of other sites: Secondary | ICD-10-CM | POA: Insufficient documentation

## 2023-09-19 DIAGNOSIS — Z79899 Other long term (current) drug therapy: Secondary | ICD-10-CM | POA: Insufficient documentation

## 2023-09-19 DIAGNOSIS — M1990 Unspecified osteoarthritis, unspecified site: Secondary | ICD-10-CM | POA: Diagnosis not present

## 2023-09-19 DIAGNOSIS — R1314 Dysphagia, pharyngoesophageal phase: Secondary | ICD-10-CM | POA: Diagnosis present

## 2023-09-19 DIAGNOSIS — Q438 Other specified congenital malformations of intestine: Secondary | ICD-10-CM

## 2023-09-19 DIAGNOSIS — K222 Esophageal obstruction: Secondary | ICD-10-CM

## 2023-09-19 DIAGNOSIS — Z860101 Personal history of adenomatous and serrated colon polyps: Secondary | ICD-10-CM | POA: Diagnosis present

## 2023-09-19 DIAGNOSIS — D123 Benign neoplasm of transverse colon: Secondary | ICD-10-CM | POA: Diagnosis not present

## 2023-09-19 DIAGNOSIS — K648 Other hemorrhoids: Secondary | ICD-10-CM

## 2023-09-19 DIAGNOSIS — K449 Diaphragmatic hernia without obstruction or gangrene: Secondary | ICD-10-CM | POA: Insufficient documentation

## 2023-09-19 DIAGNOSIS — Q402 Other specified congenital malformations of stomach: Secondary | ICD-10-CM | POA: Diagnosis not present

## 2023-09-19 DIAGNOSIS — D175 Benign lipomatous neoplasm of intra-abdominal organs: Secondary | ICD-10-CM

## 2023-09-19 DIAGNOSIS — E039 Hypothyroidism, unspecified: Secondary | ICD-10-CM | POA: Insufficient documentation

## 2023-09-19 DIAGNOSIS — K3189 Other diseases of stomach and duodenum: Secondary | ICD-10-CM | POA: Diagnosis not present

## 2023-09-19 DIAGNOSIS — Z1211 Encounter for screening for malignant neoplasm of colon: Secondary | ICD-10-CM | POA: Insufficient documentation

## 2023-09-19 DIAGNOSIS — K219 Gastro-esophageal reflux disease without esophagitis: Secondary | ICD-10-CM | POA: Diagnosis not present

## 2023-09-19 DIAGNOSIS — K635 Polyp of colon: Secondary | ICD-10-CM | POA: Diagnosis not present

## 2023-09-19 HISTORY — PX: COLONOSCOPY: SHX5424

## 2023-09-19 HISTORY — PX: ESOPHAGOGASTRODUODENOSCOPY: SHX5428

## 2023-09-19 HISTORY — PX: ESOPHAGEAL DILATION: SHX303

## 2023-09-19 SURGERY — COLONOSCOPY
Anesthesia: General

## 2023-09-19 MED ORDER — PHENYLEPHRINE 80 MCG/ML (10ML) SYRINGE FOR IV PUSH (FOR BLOOD PRESSURE SUPPORT)
PREFILLED_SYRINGE | INTRAVENOUS | Status: DC | PRN
  Administered 2023-09-19 (×3): 160 ug via INTRAVENOUS

## 2023-09-19 MED ORDER — LIDOCAINE 2% (20 MG/ML) 5 ML SYRINGE
INTRAMUSCULAR | Status: DC | PRN
  Administered 2023-09-19: 50 mg via INTRAVENOUS

## 2023-09-19 MED ORDER — PROPOFOL 500 MG/50ML IV EMUL
INTRAVENOUS | Status: DC | PRN
Start: 2023-09-19 — End: 2023-09-19
  Administered 2023-09-19: 200 ug/kg/min via INTRAVENOUS

## 2023-09-19 MED ORDER — LACTATED RINGERS IV SOLN: INTRAVENOUS | Status: DC

## 2023-09-19 MED ORDER — LACTATED RINGERS IV SOLN: INTRAVENOUS | Status: DC | PRN

## 2023-09-19 MED ORDER — EPHEDRINE SULFATE-NACL 50-0.9 MG/10ML-% IV SOSY
PREFILLED_SYRINGE | INTRAVENOUS | Status: DC | PRN
  Administered 2023-09-19 (×2): 10 mg via INTRAVENOUS

## 2023-09-19 NOTE — Op Note (Addendum)
 [facility] Patient Name: Latasha Mclaughlin Procedure Date: 09/19/2023 12:58 PM MRN: [id number] Date of Birth: 05-09-1945 Attending MD: Rolando Cliche. Mordechai April , [place], [id number] CSN: [id number] Age: 78 Admit Type: Outpatient Procedure:                Upper GI endoscopy Indications:              Dysphagia Providers:                Rolando Cliche. Mordechai April, DO, Troy Furnish. Hazeline Lister RN, RN,                            Italy Wilson, Technician, Theola Fitch Referring MD:              Medicines:                See the Anesthesia note for documentation of the                            administered medications Complications:            No immediate complications. Estimated Blood Loss:     Estimated blood loss was minimal. Procedure:                Pre-Anesthesia Assessment:                           - The anesthesia plan was to use monitored                            anesthesia care (MAC).                           After obtaining informed consent, the endoscope was                            passed under direct vision. Throughout the                            procedure, the patient's blood pressure, pulse, and                            oxygen saturations were monitored continuously. The                            [id number]-[id number] ([id number]) scope was introduced through the                            mouth, and advanced to the second part of duodenum.                            The upper GI endoscopy was accomplished without                            difficulty. The patient tolerated the procedure                            well. Scope  In: 1:17:21 PM Scope Out: 1:23:19 PM Total Procedure Duration: 0 hours 5 minutes 58 seconds  Findings:      A medium-sized hiatal hernia was present.      A mild Schatzki ring was found in the distal esophagus. A TTS dilator       was passed through the scope. Dilation with an 18-19-20 mm balloon       dilator was performed to 20 mm. The dilation site was examined and        showed mild mucosal disruption and moderate improvement in luminal       narrowing. Ring then further disrupted with cold forceps.      The entire examined stomach was normal.      Localized nodular mucosa was found in the duodenal bulb. Biopsies were       taken with a cold forceps for histology. Impression:               - Medium-sized hiatal hernia.                           - Mild Schatzki ring. Dilated.                           - Normal stomach.                           - Nodular mucosa in the duodenal bulb. Biopsied. Moderate Sedation:      Per Anesthesia Care Recommendation:           - Patient has a contact number available for                            emergencies. The signs and symptoms of potential                            delayed complications were discussed with the                            patient. Return to normal activities tomorrow.                            Written discharge instructions were provided to the                            patient.                           - Resume previous diet.                           - Continue present medications.                           - Await pathology results.                           - Repeat upper endoscopy PRN for retreatment.                           -  Return to GI clinic in 3 months. Procedure Code(s):        --- Professional ---                           [phone], Esophagogastroduodenoscopy, flexible,                            transoral; with transendoscopic balloon dilation of                            esophagus (less than 30 mm diameter)                           [id number], 59, Esophagogastroduodenoscopy, flexible,                            transoral; with biopsy, single or multiple Diagnosis Code(s):        --- Professional ---                           K44.9, Diaphragmatic hernia without obstruction or                            gangrene                           K22.2, Esophageal obstruction                            K31.89, Other diseases of stomach and duodenum                           R13.10, Dysphagia, unspecified CPT copyright 2022 American Medical Association. All rights reserved. The codes documented in this report are preliminary and upon coder review may  be revised to meet current compliance requirements. Rolando Cliche. Mordechai April, DO Rolando Cliche. Mordechai April, DO 09/19/2023 1:26:44 PM This report has been signed electronically. Number of Addenda: 0

## 2023-09-19 NOTE — Transfer of Care (Signed)
 Immediate Anesthesia Transfer of Care Note  Patient: Latasha Mclaughlin  Procedure(s) Performed: COLONOSCOPY EGD (ESOPHAGOGASTRODUODENOSCOPY) DILATION, ESOPHAGUS  Patient Location: Endoscopy Unit  Anesthesia Type:General  Level of Consciousness: awake, alert , and patient cooperative  Airway & Oxygen Therapy: Patient Spontanous Breathing  Post-op Assessment: Report given to RN, Post -op Vital signs reviewed and stable, and Patient moving all extremities X 4  Post vital signs: Reviewed and stable  Last Vitals:  Vitals Value Taken Time  BP 91/42 09/19/23 1346  Temp 36.5 C 09/19/23 1346  Pulse 68 09/19/23 1346  Resp 22 09/19/23 1346  SpO2 97 % 09/19/23 1346    Last Pain:  Vitals:   09/19/23 1346  TempSrc: Axillary  PainSc: 0-No pain      Patients Stated Pain Goal: 6 (09/19/23 1312)  Complications: No notable events documented.

## 2023-09-19 NOTE — Anesthesia Preprocedure Evaluation (Addendum)
 Anesthesia Evaluation  Patient identified by MRN, date of birth, ID band Patient awake    Reviewed: Allergy & Precautions, H&P , NPO status , Patient's Chart, lab work & pertinent test results  Airway Mallampati: II  TM Distance: >3 FB Neck ROM: Full    Dental no notable dental hx.    Pulmonary former smoker   Pulmonary exam normal breath sounds clear to auscultation       Cardiovascular hypertension, Normal cardiovascular exam Rhythm:Regular Rate:Normal     Neuro/Psych  Neuromuscular disease negative neurological ROS  negative psych ROS   GI/Hepatic Neg liver ROS, hiatal hernia,GERD  ,,  Endo/Other  Hypothyroidism    Renal/GU negative Renal ROS  negative genitourinary   Musculoskeletal negative musculoskeletal ROS (+) Arthritis ,    Abdominal   Peds negative pediatric ROS (+)  Hematology negative hematology ROS (+)   Anesthesia Other Findings   Reproductive/Obstetrics negative OB ROS                             Anesthesia Physical Anesthesia Plan  ASA: 2  Anesthesia Plan: General   Post-op Pain Management:    Induction: Intravenous  PONV Risk Score and Plan:   Airway Management Planned: Nasal Cannula  Additional Equipment:   Intra-op Plan:   Post-operative Plan:   Informed Consent: I have reviewed the patients History and Physical, chart, labs and discussed the procedure including the risks, benefits and alternatives for the proposed anesthesia with the patient or authorized representative who has indicated his/her understanding and acceptance.     Dental advisory given  Plan Discussed with: CRNA  Anesthesia Plan Comments:        Anesthesia Quick Evaluation

## 2023-09-19 NOTE — Op Note (Signed)
 [facility] Patient Name: Latasha Mclaughlin Procedure Date: 09/19/2023 12:55 PM MRN: [id number] Date of Birth: [birth year] Attending MD: Rolando Cliche. Mordechai April , [place], [id number] CSN: [id number] Age: 78 Admit Type: Outpatient Procedure:                Colonoscopy Indications:              Surveillance: Personal history of colonic polyps                            (unknown histology) on last colonoscopy 5 years ago Providers:                Rolando Cliche. Mordechai April, DO, Troy Furnish. Hazeline Lister RN, RN,                            [place]aly Wilson, Technician, Theola Fitch Referring MD:              Medicines:                See the Anesthesia note for documentation of the                            administered medications Complications:            No immediate complications. Estimated Blood Loss:     Estimated blood loss was minimal. Procedure:                Pre-Anesthesia Assessment:                           - The anesthesia plan was to use monitored                            anesthesia care (MAC).                           After obtaining informed consent, the colonoscope                            was passed under direct vision. Throughout the                            procedure, the patient's blood pressure, pulse, and                            oxygen saturations were monitored continuously. The                            [id number]-[id number] ([id number]) scope was introduced through                            the anus and advanced to the the cecum, identified                            by appendiceal orifice and ileocecal valve. The  colonoscopy was performed without difficulty. The                            patient tolerated the procedure well. The quality                            of the bowel preparation was evaluated using the                            BBPS [place] [facility] Bowel Preparation Scale) with scores                            of: Right Colon = 3, Transverse Colon = 3 and Left                             Colon = 3 (entire mucosa seen well with no residual                            staining, small fragments of stool or opaque                            liquid). The total BBPS score equals 9. Scope In: 1:28:20 PM Scope Out: 1:40:57 PM Scope Withdrawal Time: 0 hours 9 minutes 7 seconds  Total Procedure Duration: 0 hours 12 minutes 37 seconds  Findings:      Non-bleeding internal hemorrhoids were found.      A 6 mm polyp was found in the transverse colon. The polyp was sessile.       The polyp was removed with a cold snare. Resection and retrieval were       complete.      There was a small lipoma, in the ascending colon.      The exam was otherwise without abnormality. Impression:               - Non-bleeding internal hemorrhoids.                           - One 6 mm polyp in the transverse colon, removed                            with a cold snare. Resected and retrieved.                           - Small lipoma in the ascending colon.                           - The examination was otherwise normal. Moderate Sedation:      Per Anesthesia Care Recommendation:           - Patient has a contact number available for                            emergencies. The signs and symptoms of potential  delayed complications were discussed with the                            patient. Return to normal activities tomorrow.                            Written discharge instructions were provided to the                            patient.                           - Resume previous diet.                           - Continue present medications.                           - Await pathology results.                           - No repeat colonoscopy due to age.                           - Return to GI clinic in 3 months. Procedure Code(s):        --- Professional ---                           [phone], Colonoscopy, flexible; with removal of                             tumor(s), polyp(s), or other lesion(s) by snare                            technique Diagnosis Code(s):        --- Professional ---                           Z86.010, Personal history of colonic polyps                           D12.3, Benign neoplasm of transverse colon (hepatic                            flexure or splenic flexure)                           D17.5, Benign lipomatous neoplasm of                            intra-abdominal organs                           K64.8, Other hemorrhoids CPT copyright 2022 American Medical Association. All rights reserved. The codes documented in this report are preliminary and upon coder review may  be revised to meet current compliance requirements. Rolando Cliche. Mordechai April, DO Rolando Cliche. Mordechai April, DO  09/19/2023 1:47:16 PM This report has been signed electronically. Number of Addenda: 0

## 2023-09-19 NOTE — Anesthesia Postprocedure Evaluation (Signed)
 Anesthesia Post Note  Patient: Latasha Mclaughlin  Procedure(s) Performed: COLONOSCOPY EGD (ESOPHAGOGASTRODUODENOSCOPY) DILATION, ESOPHAGUS  Patient location during evaluation: PACU Anesthesia Type: General Level of consciousness: awake and alert Pain management: pain level controlled Vital Signs Assessment: post-procedure vital signs reviewed and stable Respiratory status: spontaneous breathing, nonlabored ventilation, respiratory function stable and patient connected to nasal cannula oxygen Cardiovascular status: stable and blood pressure returned to baseline Postop Assessment: no apparent nausea or vomiting Anesthetic complications: no  No notable events documented.   Last Vitals:  Vitals:   09/19/23 1346 09/19/23 1352  BP: (!) 91/42 (!) 133/50  Pulse: 68   Resp: (!) 22   Temp: 36.5 C   SpO2: 97% 98%    Last Pain:  Vitals:   09/19/23 1346  TempSrc: Axillary  PainSc: 0-No pain                 Beacher [place]

## 2023-09-19 NOTE — Discharge Instructions (Addendum)
 EGD Discharge instructions Please read the instructions outlined below and refer to this sheet in the next few weeks. These discharge instructions provide you with general information on caring for yourself after you leave the hospital. Your doctor may also give you specific instructions. While your treatment has been planned according to the most current medical practices available, unavoidable complications occasionally occur. If you have any problems or questions after discharge, please call your doctor. ACTIVITY You may resume your regular activity but move at a slower pace for the next 24 hours.  Take frequent rest periods for the next 24 hours.  Walking will help expel (get rid of) the air and reduce the bloated feeling in your abdomen.  No driving for 24 hours (because of the anesthesia (medicine) used during the test).  You may shower.  Do not sign any important legal documents or operate any machinery for 24 hours (because of the anesthesia used during the test).  NUTRITION Drink plenty of fluids.  You may resume your normal diet.  Begin with a light meal and progress to your normal diet.  Avoid alcoholic beverages for 24 hours or as instructed by your caregiver.  MEDICATIONS You may resume your normal medications unless your caregiver tells you otherwise.  WHAT YOU CAN EXPECT TODAY You may experience abdominal discomfort such as a feeling of fullness or "gas" pains.  FOLLOW-UP Your doctor will discuss the results of your test with you.  SEEK IMMEDIATE MEDICAL ATTENTION IF ANY OF THE FOLLOWING OCCUR: Excessive nausea (feeling sick to your stomach) and/or vomiting.  Severe abdominal pain and distention (swelling).  Trouble swallowing.  Temperature over 101 F (37.8 C).  Rectal bleeding or vomiting of blood.    Colonoscopy Discharge Instructions  Read the instructions outlined below and refer to this sheet in the next few weeks. These discharge instructions provide you with  general information on caring for yourself after you leave the hospital. Your doctor may also give you specific instructions. While your treatment has been planned according to the most current medical practices available, unavoidable complications occasionally occur.   ACTIVITY You may resume your regular activity, but move at a slower pace for the next 24 hours.  Take frequent rest periods for the next 24 hours.  Walking will help get rid of the air and reduce the bloated feeling in your belly (abdomen).  No driving for 24 hours (because of the medicine (anesthesia) used during the test).   Do not sign any important legal documents or operate any machinery for 24 hours (because of the anesthesia used during the test).  NUTRITION Drink plenty of fluids.  You may resume your normal diet as instructed by your doctor.  Begin with a light meal and progress to your normal diet. Heavy or fried foods are harder to digest and may make you feel sick to your stomach (nauseated).  Avoid alcoholic beverages for 24 hours or as instructed.  MEDICATIONS You may resume your normal medications unless your doctor tells you otherwise.  WHAT YOU CAN EXPECT TODAY Some feelings of bloating in the abdomen.  Passage of more gas than usual.  Spotting of blood in your stool or on the toilet paper.  IF YOU HAD POLYPS REMOVED DURING THE COLONOSCOPY: No aspirin products for 7 days or as instructed.  No alcohol for 7 days or as instructed.  Eat a soft diet for the next 24 hours.  FINDING OUT THE RESULTS OF YOUR TEST Not all test results are available  during your visit. If your test results are not back during the visit, make an appointment with your caregiver to find out the results. Do not assume everything is normal if you have not heard from your caregiver or the medical facility. It is important for you to follow up on all of your test results.  SEEK IMMEDIATE MEDICAL ATTENTION IF: You have more than a spotting of  blood in your stool.  Your belly is swollen (abdominal distention).  You are nauseated or vomiting.  You have a temperature over 101.  You have abdominal pain or discomfort that is severe or gets worse throughout the day.   Your EGD revealed mild amount inflammation in your small bowel.  I took biopsies of this.  You also had a medium size hiatal hernia as well as a tightening of your esophagus called a Schatzki's ring.  I stretched this out today.  Hopefully this helps with feeling of food getting stuck.  Your colonoscopy revealed 1 polyp(s) which I removed successfully. Await pathology results, my office will contact you.  Given your age, I do not think we need to perform further colonoscopies for polyp surveillance.  Follow-up in 3 months.   I hope you have a great rest of your week!  Rolando Cliche. Mordechai April, D.O. Gastroenterology and Hepatology [facility]

## 2023-09-19 NOTE — Interval H&P Note (Signed)
 History and Physical Interval Note:  09/19/2023 1:04 PM  [name]  has presented today for surgery, with the diagnosis of HISTORY OF POLYPS, DYSPHAGIA.  The various methods of treatment have been discussed with the patient and family. After consideration of risks, benefits and other options for treatment, the patient has consented to  Procedure(s) with comments: COLONOSCOPY (N/A) - 1:30PM;ASA 3 EGD (ESOPHAGOGASTRODUODENOSCOPY) (N/A) - 1:30PM;ASA 3 DILATION, ESOPHAGUS (N/A) - 1:30PM;ASA 3 as a surgical intervention.  The patient's history has been reviewed, patient examined, no change in status, stable for surgery.  I have reviewed the patient's chart and labs.  Questions were answered to the patient's satisfaction.     Latasha Mclaughlin

## 2023-09-20 ENCOUNTER — Encounter (HOSPITAL_COMMUNITY): Payer: Self-pay | Admitting: Internal Medicine

## 2023-09-20 LAB — SURGICAL PATHOLOGY

## 2023-09-22 ENCOUNTER — Ambulatory Visit: Payer: Self-pay | Admitting: Internal Medicine

## 2023-09-26 ENCOUNTER — Ambulatory Visit: Payer: Self-pay | Admitting: Internal Medicine

## 2023-10-19 ENCOUNTER — Other Ambulatory Visit: Payer: Self-pay | Admitting: Family Medicine

## 2023-10-19 NOTE — Telephone Encounter (Unsigned)
 Copied from CRM [id number]. Topic: Clinical - Medication Refill >> Oct 19, 2023  3:46 PM Allyne Areola wrote: Medication: gabapentin  (NEURONTIN ) 300 MG capsule [[id number]]  Has the patient contacted their pharmacy? Yes, pharmacy is calling to place the request.  (Agent: If no, request that the patient contact the pharmacy for the refill. If patient does not wish to contact the pharmacy document the reason why and proceed with request.) (Agent: If yes, when and what did the pharmacy advise?)  This is the patient's preferred pharmacy:  [facility] [phone] - [place], [place] - [address] AT [facility] [address] [place] [address] Phone: [phone] Fax: [phone]  Is this the correct pharmacy for this prescription? Yes If no, delete pharmacy and type the correct one.   Has the prescription been filled recently? No  Is the patient out of the medication? No  Has the patient been seen for an appointment in the last year OR does the patient have an upcoming appointment? Yes  Can we respond through MyChart? Yes  Agent: Please be advised that Rx refills may take up to 3 business days. We ask that you follow-up with your pharmacy.

## 2023-10-20 ENCOUNTER — Encounter: Payer: Self-pay | Admitting: Family Medicine

## 2023-10-20 ENCOUNTER — Ambulatory Visit: Payer: Medicare Other | Admitting: Family Medicine

## 2023-10-20 ENCOUNTER — Telehealth: Payer: Self-pay

## 2023-10-20 ENCOUNTER — Other Ambulatory Visit (HOSPITAL_COMMUNITY): Payer: Self-pay

## 2023-10-20 VITALS — BP 136/73 | HR 65 | Temp 98.4°F | Ht 65.5 in | Wt 176.2 lb

## 2023-10-20 DIAGNOSIS — G608 Other hereditary and idiopathic neuropathies: Secondary | ICD-10-CM

## 2023-10-20 DIAGNOSIS — Z780 Asymptomatic menopausal state: Secondary | ICD-10-CM | POA: Diagnosis not present

## 2023-10-20 DIAGNOSIS — M792 Neuralgia and neuritis, unspecified: Secondary | ICD-10-CM | POA: Diagnosis not present

## 2023-10-20 DIAGNOSIS — D126 Benign neoplasm of colon, unspecified: Secondary | ICD-10-CM

## 2023-10-20 DIAGNOSIS — E039 Hypothyroidism, unspecified: Secondary | ICD-10-CM

## 2023-10-20 DIAGNOSIS — M8580 Other specified disorders of bone density and structure, unspecified site: Secondary | ICD-10-CM

## 2023-10-20 DIAGNOSIS — R7301 Impaired fasting glucose: Secondary | ICD-10-CM

## 2023-10-20 DIAGNOSIS — R030 Elevated blood-pressure reading, without diagnosis of hypertension: Secondary | ICD-10-CM | POA: Diagnosis not present

## 2023-10-20 LAB — POCT GLYCOSYLATED HEMOGLOBIN (HGB A1C): Hemoglobin A1C: 5.5 % (ref 4.0–5.6)

## 2023-10-20 LAB — TSH: TSH: 0.19 u[IU]/mL — ABNORMAL LOW (ref 0.35–5.50)

## 2023-10-20 MED ORDER — GABAPENTIN 300 MG PO CAPS: 300.0000 mg | ORAL_CAPSULE | Freq: Three times a day (TID) | ORAL | 3 refills | Status: AC

## 2023-10-20 NOTE — Progress Notes (Signed)
 Subjective  CC:  Chief Complaint  Patient presents with   Hypertension   Hypothyroidism   IFG      HPI: Latasha Mclaughlin is a 78 y.o. female who presents to the office today to address the problems listed above in the chief complaint. Discussed the use of AI scribe software for clinical note transcription with the patient, who gave verbal consent to proceed.  History of Present Illness Latasha Mclaughlin is a 78 year old female who presents for a routine follow-up visit.  Follow-up hypothyroidism, pain and B12 deficiency, neuropathic pain and peripheral neuropathy, whitecoat hypertension, history of adenomatous polyps and osteopenia.  She requires a refill for her gabapentin  prescription and B12 medication. She believes her other medications have sufficient refills until her next visit.  Gabapentin  continues to help control her peripheral neuropathy symptoms.  No foot sores.  However at times walking can be difficult due to her neuropathy.  She does have chronic low back pain as well.  Request handicap sticker.  Her home blood pressure readings have been stable, and her blood pressure was normal during a recent colonoscopy.  Impaired fasting glucose: She monitors her blood sugar levels at home, with morning readings between 97 and 102 mg/dL and postprandial levels rising to 123-130 mg/dL, returning to normal within a couple of hours. She is maintaining her diet to stabilize her blood sugar.  Last A1c was 6.2  She recently underwent a colonoscopy, mammogram, and bone density test.  Colonoscopy did have 1 polyp but she does not require any further screening given her age.  She is happy about this.  Mammogram was normal.  Bone density shows lowest T- 2.3.  Osteopenia is stable.  Continue weightbearing exercise and monitoring.   Her thyroid  level was slightly lower last year.  Here for recheck.  She clinically feels well.  On 88 mcg daily of levothyroxine .   Assessment  1. Hypothyroidism  (acquired)   2. IFG (impaired fasting glucose)   3. White coat syndrome without diagnosis of hypertension   4. Adenomatous polyp of colon, unspecified part of colon   5. Idiopathic small fiber sensory neuropathy   6. Osteopenia after menopause   7. Neuropathic pain      Plan  Assessment and Plan Assessment & Plan Peripheral Neuropathy Experiences difficulty walking due to numbness in feet, uses a cane. Agreed to obtain a handicap parking placard. - Provide form for handicap parking placard. - Continue gabapentin  300 3 times daily  Impaired fasting glucose Blood glucose levels within acceptable ranges. Current management effective with HbA1c of 5.5%. - Continue monitoring blood glucose levels at home. - Maintain a stable diet and avoid excessive sweets.  Hypothyroidism Last TSH level slightly lower than desired, suggesting possible over-replacement of thyroid  hormone. - Recheck TSH level today.  Osteopenia Bone density on the cusp of osteoporosis. Advised maintaining physical activity. - Encourage physical activity to maintain bone strength. - Plan to recheck bone density in the future.  General Health Maintenance Completed mammogram and colonoscopy. - Schedule follow-up physical in six months. - Encourage participation in telephone wellness check.    Follow up: 6 months for complete physical and chronic problem follow-up Orders Placed This Encounter  Procedures   TSH   POCT HgB A1C   Meds ordered this encounter  Medications   gabapentin  (NEURONTIN ) 300 MG capsule    Sig: Take 1 capsule (300 mg total) by mouth 3 (three) times daily.    Dispense:  270 capsule  Refill:  3     I reviewed the patients updated PMH, FH, and SocHx.  Patient Active Problem List   Diagnosis Date Noted   Adenomatous colon polyp 04/21/2023    Priority: High   Idiopathic small fiber sensory neuropathy 07/18/2020    Priority: High   White coat syndrome without diagnosis of hypertension  04/09/2020    Priority: High   Neuropathic pain 09/12/2019    Priority: High   Colon polyp 11/18/2017    Priority: High   Hypothyroidism (acquired) 12/15/2016    Priority: High   Mixed hyperlipidemia 12/15/2016    Priority: High   Osteopenia after menopause 03/22/2019    Priority: Medium    GERD (gastroesophageal reflux disease) 03/16/2019    Priority: Medium    Hiatal hernia 03/16/2019    Priority: Medium    Vitamin B12 deficiency 10/20/2018    Priority: Low   History of hyperparathyroidism 01/08/2017    Priority: Low   IFG (impaired fasting glucose) 04/21/2023   No outpatient medications have been marked as taking for the 10/20/23 encounter (Office Visit) with Luevenia Saha, MD.   Allergies: Patient is allergic to prevacid [lansoprazole], doxycycline, duloxetine hcl, pregabalin, ciprofloxacin, and meloxicam. Family History: Patient family history includes Cancer in her father; Colon polyps in her father and sister; Congestive Heart Failure in her mother; Diabetes in her father and son; Drug abuse in her brother; Heart disease in her father and mother; Hyperlipidemia in her father and mother; Hypertension in her mother; Thyroid  disease in her sister. Social History:  Patient  reports that she has quit smoking. She has never used smokeless tobacco. She reports that she does not drink alcohol and does not use drugs.  Review of Systems: Constitutional: Negative for fever malaise or anorexia Cardiovascular: negative for chest pain Respiratory: negative for SOB or persistent cough Gastrointestinal: negative for abdominal pain  Objective  Vitals: BP 136/73   Pulse 65   Temp 98.4 F (36.9 C)   Ht 5' 5.5 (1.664 m)   Wt 176 lb 3.2 oz (79.9 kg)   SpO2 95%   BMI 28.88 kg/m  General: no acute distress , A&Ox3 HEENT: PEERL, conjunctiva normal, neck is supple Cardiovascular:  RRR without murmur or gallop.  Respiratory:  Good breath sounds bilaterally, CTAB with normal  respiratory effort Skin:  Warm, no rashes Commons side effects, risks, benefits, and alternatives for medications and treatment plan prescribed today were discussed, and the patient expressed understanding of the given instructions. Patient is instructed to call or message via MyChart if he/she has any questions or concerns regarding our treatment plan. No barriers to understanding were identified. We discussed Red Flag symptoms and signs in detail. Patient expressed understanding regarding what to do in case of urgent or emergency type symptoms.  Medication list was reconciled, printed and provided to the patient in AVS. Patient instructions and summary information was reviewed with the patient as documented in the AVS. This note was prepared with assistance of Dragon voice recognition software. Occasional wrong-word or sound-a-like substitutions may have occurred due to the inherent limitations of voice recognition software

## 2023-10-20 NOTE — Telephone Encounter (Signed)
 Pharmacy Patient Advocate Encounter   Received notification from Onbase that prior authorization for GABAPENTIN  300MG  CAPS is required/requested.   Insurance verification completed.   The patient is insured through [facility] MEDIC[employer] .   Per test claim: The current 90 day co-pay is, $35.21.  No PA needed at this time. This test claim was processed through [facility]- copay amounts may vary at other pharmacies due to pharmacy/plan contracts, or as the patient moves through the different stages of their insurance plan.

## 2023-10-20 NOTE — Patient Instructions (Signed)
 Please return in 6 months for your annual complete physical; please come fasting.  For follow up on chronic medical conditions   I will release your lab results to you on your MyChart account with further instructions. You may see the results before I do, but when I review them I will send you a message with my report or have my assistant call you if things need to be discussed. Please reply to my message with any questions. Thank you!   If you have any questions or concerns, please don't hesitate to send me a message via MyChart or call the office at [phone]. Thank you for visiting with us  today! It's our pleasure caring for you.    VISIT SUMMARY: Today, you came in for a routine follow-up visit. We discussed your current medications, reviewed your recent test results, and addressed your concerns about walking difficulties and obtaining a handicap parking sticker.  YOUR PLAN: -PERIPHERAL NEUROPATHY: Peripheral neuropathy is a condition that causes numbness and weakness in your feet, making it difficult to walk. We will provide you with a form to obtain a handicap parking placard to help with your mobility.  -TYPE 2 DIABETES MELLITUS: Type 2 diabetes is a condition where your body has trouble regulating blood sugar levels. Your blood glucose levels are within acceptable ranges, and your current management is effective. Please continue monitoring your blood glucose levels at home and maintain a stable diet while avoiding excessive sweets.  -HYPOTHYROIDISM: Hypothyroidism is a condition where your thyroid  gland does not produce enough thyroid  hormone. Your last TSH level was slightly lower than desired, which may indicate over-replacement of thyroid  hormone. We will recheck your TSH level today.  -OSTEOPENIA: Osteopenia is a condition where your bone density is lower than normal, putting you at risk for osteoporosis. To maintain bone strength, please continue physical activity. We will plan to  recheck your bone density in the future.  -GENERAL HEALTH MAINTENANCE: You have completed your mammogram and colonoscopy. Please schedule a follow-up physical in six months and consider participating in a telephone wellness check.  INSTRUCTIONS: Please recheck your TSH level today. Schedule a follow-up physical in six months. We will also provide you with a form to obtain a handicap parking placard.                      Contains text generated by Abridge.                                 Contains text generated by Abridge.

## 2023-10-22 ENCOUNTER — Ambulatory Visit: Payer: Self-pay | Admitting: Family Medicine

## 2023-10-22 DIAGNOSIS — E039 Hypothyroidism, unspecified: Secondary | ICD-10-CM

## 2023-10-22 MED ORDER — LEVOTHYROXINE SODIUM 75 MCG PO TABS: 75.0000 ug | ORAL_TABLET | Freq: Every day | ORAL | 3 refills | Status: AC

## 2023-10-22 NOTE — Progress Notes (Signed)
 Please call patient: Thyroid  test shows that she is getting too much medication. Will lower levothyroxine  from 88mcg daily to 75mcg daily. I have ordered the new dose for her to start. Then will need to have her return in 8 weeks for a lab visit to recheck. Please get scheduled. I have ordered the medication and lab test.

## 2023-10-24 ENCOUNTER — Other Ambulatory Visit: Payer: Self-pay

## 2023-10-24 MED ORDER — LEVOTHYROXINE SODIUM 75 MCG PO TABS: 75.0000 ug | ORAL_TABLET | Freq: Every day | ORAL | 1 refills | Status: DC

## 2023-10-31 ENCOUNTER — Other Ambulatory Visit: Payer: Self-pay

## 2023-10-31 DIAGNOSIS — E538 Deficiency of other specified B group vitamins: Secondary | ICD-10-CM

## 2023-11-01 ENCOUNTER — Other Ambulatory Visit: Payer: Self-pay

## 2023-11-01 DIAGNOSIS — E538 Deficiency of other specified B group vitamins: Secondary | ICD-10-CM

## 2023-11-01 MED ORDER — BD INTEGRA SYRINGE 25G X 1" 3 ML MISC: 1 refills | Status: AC

## 2023-11-01 MED ORDER — CYANOCOBALAMIN 1000 MCG/ML IJ SOLN: INTRAMUSCULAR | 3 refills | Status: DC

## 2023-11-01 NOTE — Telephone Encounter (Signed)
 LVM for pt to let her know that I have ordered her syringes and B12 and she will need to F/U in 8 weeks for lab appt for TSH and it has been placed in future

## 2023-12-21 ENCOUNTER — Other Ambulatory Visit

## 2023-12-22 ENCOUNTER — Other Ambulatory Visit (INDEPENDENT_AMBULATORY_CARE_PROVIDER_SITE_OTHER)

## 2023-12-22 DIAGNOSIS — E039 Hypothyroidism, unspecified: Secondary | ICD-10-CM

## 2023-12-22 LAB — TSH: TSH: 0.61 u[IU]/mL (ref 0.35–5.50)

## 2023-12-27 ENCOUNTER — Ambulatory Visit: Payer: Self-pay | Admitting: Family Medicine

## 2023-12-27 NOTE — Progress Notes (Signed)
 See mychart note Dear Ms. Sanmiguel, Your thyroid  test result is now normal. Please continue taking levothyroxine  75mcg daily.  Sincerely, Dr. Jodie

## 2024-01-12 ENCOUNTER — Ambulatory Visit

## 2024-02-10 ENCOUNTER — Other Ambulatory Visit: Payer: Self-pay | Admitting: Family

## 2024-02-14 ENCOUNTER — Other Ambulatory Visit: Payer: Self-pay

## 2024-02-14 ENCOUNTER — Encounter: Payer: Self-pay | Admitting: Family Medicine

## 2024-02-14 MED ORDER — ROSUVASTATIN CALCIUM 10 MG PO TABS: 10.0000 mg | ORAL_TABLET | Freq: Every day | ORAL | 1 refills | Status: AC

## 2024-03-05 ENCOUNTER — Encounter: Payer: Self-pay | Admitting: Radiology

## 2024-03-13 ENCOUNTER — Other Ambulatory Visit: Payer: Self-pay | Admitting: Family Medicine

## 2024-03-13 ENCOUNTER — Other Ambulatory Visit: Payer: Self-pay | Admitting: Family

## 2024-03-13 DIAGNOSIS — E538 Deficiency of other specified B group vitamins: Secondary | ICD-10-CM

## 2024-03-15 ENCOUNTER — Encounter: Payer: Self-pay | Admitting: Internal Medicine

## 2024-03-19 ENCOUNTER — Encounter: Payer: Self-pay | Admitting: Family Medicine

## 2024-03-19 MED ORDER — TRAMADOL HCL 50 MG PO TABS: 50.0000 mg | ORAL_TABLET | Freq: Two times a day (BID) | ORAL | 1 refills | Status: AC

## 2024-04-20 ENCOUNTER — Ambulatory Visit: Admitting: Family Medicine

## 2024-04-20 ENCOUNTER — Encounter: Payer: Self-pay | Admitting: Family Medicine

## 2024-04-20 VITALS — BP 110/70 | HR 70 | Temp 97.9°F | Resp 18 | Ht 65.0 in | Wt 176.4 lb

## 2024-04-20 DIAGNOSIS — R7301 Impaired fasting glucose: Secondary | ICD-10-CM | POA: Diagnosis not present

## 2024-04-20 DIAGNOSIS — E782 Mixed hyperlipidemia: Secondary | ICD-10-CM | POA: Diagnosis not present

## 2024-04-20 DIAGNOSIS — R030 Elevated blood-pressure reading, without diagnosis of hypertension: Secondary | ICD-10-CM | POA: Diagnosis not present

## 2024-04-20 DIAGNOSIS — F32A Depression, unspecified: Secondary | ICD-10-CM | POA: Insufficient documentation

## 2024-04-20 DIAGNOSIS — E538 Deficiency of other specified B group vitamins: Secondary | ICD-10-CM | POA: Diagnosis not present

## 2024-04-20 DIAGNOSIS — G2581 Restless legs syndrome: Secondary | ICD-10-CM | POA: Insufficient documentation

## 2024-04-20 DIAGNOSIS — M792 Neuralgia and neuritis, unspecified: Secondary | ICD-10-CM

## 2024-04-20 DIAGNOSIS — E039 Hypothyroidism, unspecified: Secondary | ICD-10-CM

## 2024-04-20 DIAGNOSIS — K469 Unspecified abdominal hernia without obstruction or gangrene: Secondary | ICD-10-CM | POA: Insufficient documentation

## 2024-04-20 DIAGNOSIS — R252 Cramp and spasm: Secondary | ICD-10-CM | POA: Insufficient documentation

## 2024-04-20 DIAGNOSIS — S99929A Unspecified injury of unspecified foot, initial encounter: Secondary | ICD-10-CM | POA: Insufficient documentation

## 2024-04-20 DIAGNOSIS — E559 Vitamin D deficiency, unspecified: Secondary | ICD-10-CM

## 2024-04-20 DIAGNOSIS — M2240 Chondromalacia patellae, unspecified knee: Secondary | ICD-10-CM | POA: Insufficient documentation

## 2024-04-20 LAB — COMPREHENSIVE METABOLIC PANEL WITH GFR
ALT: 12 U/L (ref 3–35)
AST: 15 U/L (ref 5–37)
Albumin: 4.3 g/dL (ref 3.5–5.2)
Alkaline Phosphatase: 52 U/L (ref 39–117)
BUN: 9 mg/dL (ref 6–23)
CO2: 30 meq/L (ref 19–32)
Calcium: 9.8 mg/dL (ref 8.4–10.5)
Chloride: 107 meq/L (ref 96–112)
Creatinine, Ser: 0.86 mg/dL (ref 0.40–1.20)
GFR: 64.94 mL/min
Glucose, Bld: 91 mg/dL (ref 70–99)
Potassium: 4.4 meq/L (ref 3.5–5.1)
Sodium: 143 meq/L (ref 135–145)
Total Bilirubin: 1 mg/dL (ref 0.2–1.2)
Total Protein: 6.5 g/dL (ref 6.0–8.3)

## 2024-04-20 LAB — T3, FREE: T3, Free: 3.5 pg/mL (ref 2.3–4.2)

## 2024-04-20 LAB — CBC WITH DIFFERENTIAL/PLATELET
Basophils Absolute: 0 K/uL (ref 0.0–0.1)
Basophils Relative: 0.8 % (ref 0.0–3.0)
Eosinophils Absolute: 0.1 K/uL (ref 0.0–0.7)
Eosinophils Relative: 2.7 % (ref 0.0–5.0)
HCT: 42.7 % (ref 36.0–46.0)
Hemoglobin: 14.2 g/dL (ref 12.0–15.0)
Lymphocytes Relative: 35.3 % (ref 12.0–46.0)
Lymphs Abs: 1.8 K/uL (ref 0.7–4.0)
MCHC: 33.2 g/dL (ref 30.0–36.0)
MCV: 93.2 fl (ref 78.0–100.0)
Monocytes Absolute: 0.5 K/uL (ref 0.1–1.0)
Monocytes Relative: 10.1 % (ref 3.0–12.0)
Neutro Abs: 2.6 K/uL (ref 1.4–7.7)
Neutrophils Relative %: 51.1 % (ref 43.0–77.0)
Platelets: 245 K/uL (ref 150.0–400.0)
RBC: 4.59 Mil/uL (ref 3.87–5.11)
RDW: 13.4 % (ref 11.5–15.5)
WBC: 5.1 K/uL (ref 4.0–10.5)

## 2024-04-20 LAB — HEMOGLOBIN A1C: Hgb A1c MFr Bld: 6.2 % (ref 4.6–6.5)

## 2024-04-20 LAB — TSH: TSH: 1.5 u[IU]/mL (ref 0.35–5.50)

## 2024-04-20 LAB — LIPID PANEL
Cholesterol: 139 mg/dL (ref 28–200)
HDL: 53.9 mg/dL
LDL Cholesterol: 61 mg/dL (ref 10–99)
NonHDL: 85.06
Total CHOL/HDL Ratio: 3
Triglycerides: 120 mg/dL (ref 10.0–149.0)
VLDL: 24 mg/dL (ref 0.0–40.0)

## 2024-04-20 LAB — T4, FREE: Free T4: 0.9 ng/dL (ref 0.60–1.60)

## 2024-04-20 LAB — VITAMIN D 25 HYDROXY (VIT D DEFICIENCY, FRACTURES): VITD: 45.7 ng/mL (ref 30.00–100.00)

## 2024-04-20 LAB — VITAMIN B12: Vitamin B-12: 532 pg/mL (ref 211–911)

## 2024-04-20 NOTE — Progress Notes (Signed)
 " Subjective  Chief Complaint  Patient presents with   Follow-up    Wants to have blood work completed and discuss medications.     HPI: Latasha Mclaughlin is a 78 y.o. female who presents to [facility] [place] today for a Female Wellness Visit. She also has the concerns and/or needs as listed above in the chief complaint. These will be addressed in addition to the Health Maintenance Visit.   Wellness Visit: annual visit with health maintenance review and exam  HM: screens are all current. Declines flu vaccine  Chronic disease f/u and/or acute problem visit: (deemed necessary to be done in addition to the wellness visit): Discussed the use of AI scribe software for clinical note transcription with the patient, who gave verbal consent to proceed.  History of Present Illness Latasha Mclaughlin is a 78 year old female who presents for a routine follow-up visit.  Peripheral neuropathy and neuropathic pain - Numbness in feet managed with gabapentin  - Increased discomfort in left upper abdomen in the afternoon if gabapentin  dose is missed in the morning; thus she knows gabapentin  continues to help manage her pain.  - Uses a cane for mobility, which she keeps in her car - no foot sores  IFG with improvement in most recent A1c.  - Monitors blood glucose at home - Postprandial blood glucose rises to 120-125 mg/dL, then returns to normal - Occasional hypoglycemia with blood glucose dropping to 70 mg/dL, with symptoms  HLD: on statin and tolerates it well.   Vit b12 deficiency with last b12 level high in June so now taking every other day. Feels fine.  White coat htn: bp is controlled. Very well now. No cp or sob or le edema  Hypothyroidism: adjusted meds down earlier this year. Last level was in range. Feels good: energy is good.   H/o vit D deficiency.   Functional status and psychosocial factors - Recently moved to an apartment due to husband's inability to manage  stairs - Finds the adjustment to the new living situation challenging - Husband recently had a pacemaker placed in August and is having difficulty adjusting to beta blocker medications     Assessment  1. Acquired hypothyroidism   2. Mixed hyperlipidemia   3. Neuropathic pain   4. White coat syndrome without diagnosis of hypertension   5. Vitamin B12 deficiency   6. IFG (impaired fasting glucose)   7. Vitamin D  deficiency      Plan  Female Wellness Visit: Age appropriate Health Maintenance and Prevention measures were discussed with patient. Included topics are cancer screening recommendations, ways to keep healthy (see AVS) including dietary and exercise recommendations, regular eye and dental care, use of seat belts, and avoidance of moderate alcohol use and tobacco use.  BMI: discussed patient's BMI and encouraged positive lifestyle modifications to help get to or maintain a target BMI. HM needs and immunizations were addressed and ordered. See below for orders. See HM and immunization section for updates. Routine labs and screening tests ordered including cmp, cbc and lipids where appropriate. Discussed recommendations regarding Vit D and calcium  supplementation (see AVS)  Chronic disease management visit and/or acute problem visit: Assessment and Plan Assessment & Plan Neuropathic pain Chronic neuropathic pain in the feet, managed with gabapentin . Symptoms worsen if gabapentin  is missed, indicating its effectiveness in symptom control. - Continue gabapentin  for neuropathic pain management.  Acquired hypothyroidism Well-managed with current treatment. Thyroid  levels are stable. Will recheck today - Continue  current thyroid  medication regimen.  Mixed hyperlipidemia Monitored. Current status to be assessed with upcoming lab work. - Ordered lab work to assess cholesterol levels. Check lfts  Vitamin B12 deficiency Monitored. Current status to be assessed with upcoming lab  work. - Ordered lab work to assess B12 levels.  Impaired fasting glucose Monitored. Blood sugar levels fluctuate but generally return to baseline after meals. Current status to be assessed with upcoming lab work. - Ordered lab work to assess glucose levels. A1c  Check vit D  HTN is controlled. No changes in meds.   General health maintenance Routine health maintenance is up to date. No new concerns or changes in medication regimen. - Continue routine health maintenance and monitoring. - Scheduled follow-up in six months unless new issues arise.    Follow up: 6 mo for recheck  Orders Placed This Encounter  Procedures   TSH   VITAMIN D  25 Hydroxy (Vit-D Deficiency, Fractures)   CBC with Differential/Platelet   Comprehensive metabolic panel with GFR   Lipid panel   Vitamin B12   Hemoglobin A1c   T4, free   T3, free   No orders of the defined types were placed in this encounter.     Body mass index is 29.35 kg/m. Wt Readings from Last 3 Encounters:  04/20/24 176 lb 6.4 oz (80 kg)  10/20/23 176 lb 3.2 oz (79.9 kg)  08/24/23 177 lb 1.6 oz (80.3 kg)     Patient Active Problem List   Diagnosis Date Noted Date Diagnosed   Adenomatous colon polyp 04/21/2023     Priority: High    Colonoscopy 2015, 2020, repeat q 5 years 2025, single polyp; no further screens recommended due to age, Dr. lindaann    Idiopathic small fiber sensory neuropathy 07/18/2020     Priority: High    Thorough eval with neurologist x 2; negative imaging; treated with gabapentin  300 bid (can't tolerate higher dose) and tramadol   Flank and abdominal pain    White coat syndrome without diagnosis of hypertension 04/09/2020     Priority: High   Neuropathic pain 09/12/2019     Priority: High   Colon polyp 11/18/2017     Priority: High   Hypothyroidism 04/01/2005     Priority: High   Hyperlipidemia 04/01/2005     Priority: High   Osteopenia 03/22/2019     Priority: Medium     dexa 03/2019: T=-2.1  hip; Lspine excluded.  Dexa 04/2021: lowest T - -2.0 femur; Lspine exluded. No statistical change, osteeopenia. Continue same. Recheck 2 years.  Dexa 2025: Stable osteopenia. Lowest t - -2.4. 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    Abdominal hernia 04/20/2024    Hypercalcemia 04/20/2024    IFG (impaired fasting glucose) 04/21/2023    Neuropathy 04/20/2020    Otalgia of both ears 11/10/2018    Essential hypertension 05/12/2015    Pain in thoracic spine 01/02/2014    Dyslipidemia 09/27/2013    Primary hyperparathyroidism 09/27/2013    Arthralgia of multiple joints 06/14/2008  Female genital symptoms 06/14/2008    Increased frequency of urination 05/31/2008    Disorder of jaw 01/18/2008    Sprain of jaw 01/18/2008    Malaise and fatigue 11/16/2007    Cough 11/08/2007    Acute sinusitis 11/08/2007    Dysuria 05/02/2007    Blood in urine 05/02/2007    Herpes simplex 01/26/2007    Anxiety state 06/16/2006    Primary fibromyalgia syndrome 01/18/2006    Osteoarthritis 10/28/2005    Low back pain 09/03/2005    Acute cholecystitis 09/03/2005    Headache 08/06/2005    Inflammation of sacroiliac joint 08/06/2005    Diarrhea 05/12/2005    Benign neoplasm of colon 04/01/2005    Health Maintenance  Topic Date Due   Mammogram  06/01/2024   Medicare Annual Wellness (AWV)  05/25/2024 (Originally 08/03/2023)   Influenza Vaccine  07/31/2024 (Originally 12/02/2023)   Bone Density Scan  06/01/2025   Colonoscopy  09/18/2028   Pneumococcal Vaccine: 50+ Years  Completed    Hepatitis C Screening  Completed   Meningococcal B Vaccine  Aged Out   DTaP/Tdap/Td  Discontinued   COVID-19 Vaccine  Discontinued   Zoster Vaccines- Shingrix  Discontinued   Immunization History  Administered Date(s) Administered   Fluad Quad(high Dose 65+) 03/16/2019, 04/09/2020   INFLUENZA, HIGH DOSE SEASONAL PF 03/19/2015, 01/21/2016, 01/19/2017, 01/27/2018   Influenza-Unspecified 02/10/2006, 01/26/2008, 01/02/2012, 03/19/2015, 01/21/2016, 01/27/2018, 02/14/2021   Pneumococcal Conjugate-13 01/21/2016   Pneumococcal Polysaccharide-23 04/10/2021   We updated and reviewed the patient's past history in detail and it is documented below. Allergies: Patient is allergic to lansoprazole, doxycycline, duloxetine hcl, pregabalin, ciprofloxacin, and meloxicam. Past Medical History Patient  has a past medical history of Allergy, Arthritis, Back pain, GERD (gastroesophageal reflux disease), Hiatal hernia (03/16/2019), History of tonsillectomy, Hyperlipidemia (12/15/2016), Hypothyroidism (12/15/2016), Osteopenia after menopause (03/22/2019), Osteoporosis, and Sinus infection. Past Surgical History Patient  has a past surgical history that includes Anterior cervical decomp/discectomy fusion (2002); Left oophorectomy; Tonsillectomy; Parathyroidectomy; Adenoidectomy; Dilation and curettage of uterus; Shoulder surgery; Colonoscopy (10/06/2018); Polypectomy; Colonoscopy (N/A, 09/19/2023); Esophagogastroduodenoscopy (N/A, 09/19/2023); and Esophageal dilation (N/A, 09/19/2023). Family History: Patient family history includes Cancer in her father; Colon polyps in her father and sister; Congestive Heart Failure in her mother; Diabetes in her father and son; Drug abuse in her brother; Heart disease in her father and mother; Hyperlipidemia in her father and mother; Hypertension in her mother; Thyroid  disease in her sister. Social History:  Patient  reports that she has quit smoking. She has never been exposed to  tobacco smoke. She has never used smokeless tobacco. She reports that she does not drink alcohol and does not use drugs.  Review of Systems: Constitutional: negative for fever or malaise Ophthalmic: negative for photophobia, double vision or loss of vision Cardiovascular: negative for chest pain, dyspnea on exertion, or new LE swelling Respiratory: negative for SOB or persistent cough Gastrointestinal: negative for abdominal pain, change in bowel habits or melena Genitourinary: negative for dysuria or gross hematuria, no abnormal uterine bleeding or disharge Musculoskeletal: negative for new gait disturbance or muscular weakness Integumentary: negative for new or persistent rashes, no breast lumps Neurological: negative for TIA or stroke symptoms Psychiatric: negative for SI or delusions Allergic/Immunologic: negative for hives  Patient Care Team    Relationship Specialty Notifications Start End  Jodie Lavern CROME, MD PCP - General Family Medicine  03/16/19   Tobie Tonita POUR, DO Consulting Physician Neurology  11/21/18   Aneita Gwendlyn DASEN, MD (Inactive) Consulting Physician  Gastroenterology  03/16/19   Hughie Sharper, MD Consulting Physician Sports Medicine  03/16/19   Blinda Katz, DPM Consulting Physician Podiatry  03/16/19   Eldonna Novel, MD Consulting Physician Physical Medicine and Rehabilitation  04/09/20   Dedra Bal, MD Referring Physician Neurology  04/09/20   Aneita Gwendlyn DASEN, MD (Inactive) Consulting Physician Gastroenterology  04/21/23     Objective  Vitals: BP 110/70   Pulse 70   Temp 97.9 F (36.6 C) (Temporal)   Resp 18   Ht 5' 5 (1.651 m)   Wt 176 lb 6.4 oz (80 kg)   SpO2 97%   BMI 29.35 kg/m  General:  Well developed, well nourished, no acute distress  Psych:  Alert and orientedx3,normal mood and affect HEENT:  Normocephalic, atraumatic, non-icteric sclera,  supple neck without adenopathy, mass or thyromegaly Cardiovascular:  Normal S1, S2, RRR  without gallop, rub or murmur Respiratory:  Good breath sounds bilaterally, CTAB with normal respiratory effort Gastrointestinal: normal bowel sounds, soft, non-tender, no noted masses. No HSM MSK: extremities without edema, joints without erythema or swelling Neurologic:    Mental status is normal.  Gross motor and sensory exams are normal.  No tremor  Commons side effects, risks, benefits, and alternatives for medications and treatment plan prescribed today were discussed, and the patient expressed understanding of the given instructions. Patient is instructed to call or message via MyChart if he/she has any questions or concerns regarding our treatment plan. No barriers to understanding were identified. We discussed Red Flag symptoms and signs in detail. Patient expressed understanding regarding what to do in case of urgent or emergency type symptoms.  Medication list was reconciled, printed and provided to the patient in AVS. Patient instructions and summary information was reviewed with the patient as documented in the AVS. This note was prepared with assistance of Dragon voice recognition software. Occasional wrong-word or sound-a-like substitutions may have occurred due to the inherent limitations of voice recognition software      "

## 2024-04-27 ENCOUNTER — Ambulatory Visit: Payer: Self-pay | Admitting: Family Medicine

## 2024-04-27 NOTE — Progress Notes (Signed)
 See mychart note Dear Ms. Deblois, Your lab results all look great! No med changes are needed. We will continue to monitory your sugars as your sugar test remains in the prediabetic range.  Eat well and happy new year. Sincerely, Dr. Jodie

## 2024-10-19 ENCOUNTER — Ambulatory Visit: Admitting: Family Medicine
